# Patient Record
Sex: Female | Born: 1950
Health system: Southern US, Community
[De-identification: ages and names within clinical notes are randomized; demographics above are authoritative.]

## PROBLEM LIST (undated history)

## (undated) DIAGNOSIS — G8929 Other chronic pain: Secondary | ICD-10-CM

## (undated) DIAGNOSIS — K601 Chronic anal fissure: Secondary | ICD-10-CM

## (undated) DIAGNOSIS — E785 Hyperlipidemia, unspecified: Secondary | ICD-10-CM

## (undated) DIAGNOSIS — E119 Type 2 diabetes mellitus without complications: Secondary | ICD-10-CM

## (undated) DIAGNOSIS — H269 Unspecified cataract: Secondary | ICD-10-CM

## (undated) DIAGNOSIS — K649 Unspecified hemorrhoids: Secondary | ICD-10-CM

## (undated) DIAGNOSIS — D649 Anemia, unspecified: Secondary | ICD-10-CM

## (undated) DIAGNOSIS — I10 Essential (primary) hypertension: Secondary | ICD-10-CM

## (undated) DIAGNOSIS — H409 Unspecified glaucoma: Secondary | ICD-10-CM

## (undated) DIAGNOSIS — K641 Second degree hemorrhoids: Secondary | ICD-10-CM

## (undated) DIAGNOSIS — K219 Gastro-esophageal reflux disease without esophagitis: Secondary | ICD-10-CM

## (undated) DIAGNOSIS — M545 Low back pain: Secondary | ICD-10-CM

## (undated) DIAGNOSIS — K602 Anal fissure, unspecified: Secondary | ICD-10-CM

## (undated) HISTORY — DX: Type 2 diabetes mellitus without complications: E11.9

## (undated) HISTORY — DX: Unspecified cataract: H26.9

## (undated) HISTORY — DX: Second degree hemorrhoids: K64.1

## (undated) HISTORY — DX: Unspecified hemorrhoids: K64.9

## (undated) HISTORY — DX: Essential (primary) hypertension: I10

## (undated) HISTORY — DX: Anemia, unspecified: D64.9

## (undated) HISTORY — DX: Unspecified glaucoma: H40.9

## (undated) HISTORY — DX: Anal fissure, unspecified: K60.2

## (undated) HISTORY — DX: Chronic anal fissure: K60.1

## (undated) HISTORY — DX: Low back pain: M54.5

## (undated) HISTORY — DX: Hyperlipidemia, unspecified: E78.5

## (undated) HISTORY — DX: Gastro-esophageal reflux disease without esophagitis: K21.9

## (undated) HISTORY — DX: Other chronic pain: G89.29

## (undated) HISTORY — PX: OTHER SURGICAL HISTORY: SHX169

---
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
## 1999-12-20 ENCOUNTER — Other Ambulatory Visit: Admission: RE | Admit: 1999-12-20 | Discharge: 1999-12-20 | Payer: Self-pay | Admitting: Obstetrics and Gynecology

## 2000-06-18 ENCOUNTER — Encounter: Payer: Self-pay | Admitting: Emergency Medicine

## 2000-06-18 ENCOUNTER — Emergency Department (HOSPITAL_COMMUNITY): Admission: EM | Admit: 2000-06-18 | Discharge: 2000-06-18 | Payer: Self-pay | Admitting: Emergency Medicine

## 2000-12-25 ENCOUNTER — Other Ambulatory Visit: Admission: RE | Admit: 2000-12-25 | Discharge: 2000-12-25 | Payer: Self-pay | Admitting: Obstetrics and Gynecology

## 2002-01-08 ENCOUNTER — Other Ambulatory Visit: Admission: RE | Admit: 2002-01-08 | Discharge: 2002-01-08 | Payer: Self-pay | Admitting: Obstetrics and Gynecology

## 2002-11-05 ENCOUNTER — Encounter (INDEPENDENT_AMBULATORY_CARE_PROVIDER_SITE_OTHER): Payer: Self-pay | Admitting: *Deleted

## 2002-11-05 ENCOUNTER — Observation Stay (HOSPITAL_COMMUNITY): Admission: RE | Admit: 2002-11-05 | Discharge: 2002-11-06 | Payer: Self-pay | Admitting: Obstetrics and Gynecology

## 2002-11-05 HISTORY — PX: BILATERAL SALPINGOOPHORECTOMY: SHX1223

## 2002-11-05 HISTORY — PX: LAPAROSCOPIC ASSISTED VAGINAL HYSTERECTOMY: SHX5398

## 2005-01-18 ENCOUNTER — Emergency Department (HOSPITAL_COMMUNITY): Admission: EM | Admit: 2005-01-18 | Discharge: 2005-01-18 | Payer: Self-pay | Admitting: Family Medicine

## 2005-08-07 ENCOUNTER — Other Ambulatory Visit: Admission: RE | Admit: 2005-08-07 | Discharge: 2005-08-07 | Payer: Self-pay | Admitting: Obstetrics and Gynecology

## 2009-05-15 ENCOUNTER — Emergency Department (HOSPITAL_COMMUNITY): Admission: EM | Admit: 2009-05-15 | Discharge: 2009-05-15 | Payer: Self-pay | Admitting: Family Medicine

## 2009-06-03 ENCOUNTER — Emergency Department (HOSPITAL_COMMUNITY): Admission: EM | Admit: 2009-06-03 | Discharge: 2009-06-03 | Payer: Self-pay | Admitting: Emergency Medicine

## 2010-10-25 LAB — URINE MICROSCOPIC-ADD ON

## 2010-10-25 LAB — URINALYSIS, ROUTINE W REFLEX MICROSCOPIC
Bilirubin Urine: NEGATIVE
Glucose, UA: NEGATIVE mg/dL
Hgb urine dipstick: NEGATIVE
Ketones, ur: NEGATIVE mg/dL
Leukocytes, UA: NEGATIVE
Nitrite: POSITIVE — AB
Protein, ur: NEGATIVE mg/dL
Specific Gravity, Urine: 1.024 (ref 1.005–1.030)
Urobilinogen, UA: 0.2 mg/dL (ref 0.0–1.0)
pH: 5 (ref 5.0–8.0)

## 2010-10-25 LAB — URINE CULTURE: Colony Count: >=100000

## 2010-12-08 NOTE — Op Note (Signed)
NAME:  Rhonda Miles, Rhonda Miles                        ACCOUNT NO.:  [id number]   MEDICAL RECORD NO.:  [id number]                   PATIENT TYPE:  OBV   LOCATION:  9120                                 FACILITY:  [facility]   PHYSICIAN:  Duke Salvia. Marcelle Overlie, M.D.            DATE OF BIRTH:  1950/11/26   DATE OF PROCEDURE:  11/05/2002  DATE OF DISCHARGE:                                 OPERATIVE REPORT   PREOPERATIVE DIAGNOSIS:  Menorrhagia unresponsive to conservative measures.   POSTOPERATIVE DIAGNOSIS:  Menorrhagia unresponsive to conservative measures.   PROCEDURE:  Laparoscopically-assisted vaginal hysterectomy, bilateral  salpingo-oophorectomy.   SURGEON:  Duke Salvia. Marcelle Overlie, M.D.   ASSISTANT:  Raynald Kemp, M.D.   ANESTHESIA:  General endotracheal.   ESTIMATED BLOOD LOSS:  150.   SPECIMENS:  Uterus, tubes, and ovaries.   DESCRIPTION OF PROCEDURE:  The patient to the operating room.  After an  adequate level of general endotracheal anesthesia was established with the  patient's legs in stirrups, the abdomen, perineum, and vagina were prepped  and draped in the usual manner for laparoscopy and LAVH-BSO.  The bladder  was drained, a UA carried out.  The uterus was midposition, normal size, and  normal.  Adnexa negative.  A Hulka tenaculum was positioned.  Attention was  directed to the abdomen, where a 2 cm subumbilical incision was made.  The  Veress needle was introduced without difficulty.  Its intra-abdominal  position was verified by pressure and water testing.  After a 2 L  pneumoperitoneum was then created, laparoscopic trocar and sleeve was then  introduced without difficulty.  There was no evidence of any bleeding or  trauma.  Three fingerbreadths above the symphysis in the midline a 5 mm  trocar was inserted under direct visualization.  The bladder had been  drained preoperatively.  The patient then placed in Trendelenburg and pelvic  findings as follows:  The uterus itself  was normal size.  Adnexa bilaterally  unremarkable.  The cul-de-sac was free and clear.  No other abnormalities  were noted.  The right adnexa was grasped with an atraumatic grasper and  placed on tension toward the midline to isolate the area of the right IP  ligament.  The course of the ureter was well below it.  The bipolar cutting  instrument was then used to coagulate and cut the right IP ligament up to  and including the right round ligament.  Again the course of this ureter was  noted on inspection to be well below this.  This area was hemostatic.  The  exact same repeated on the opposite side, freeing up the left adnexa.  When  this was completed, the vaginal portion of the procedure was started and the  legs were extended further.  The weighted speculum was positioned and the  cervix grasped with a tenaculum.  The cervicovaginal mucosa was incised.  Posterior culdotomy performed without difficulty.  The left uterosacral  ligament was clamped, coagulated, and cut.  Same on the opposite side.  The  bladder was advanced superiorly until the peritoneal reflection could be  identified, and this was entered sharply.  The retractor was used to gently  elevate the bladder out of the field.  In a sequential manner the cardinal  ligament, uterine vasculature pedicle, and upper broad ligament pedicles  were clamped, coagulated, and cut.  The fundus of the uterus was then  delivered posteriorly.  The remaining pedicles were clamped, coagulated, and  divided.  The cuff closed with a running locked 2-0 Monocryl suture.  All  major pedicles inspected at that point and noted to be hemostatic.  Prior to  closure, the sponge, needle, and instrument counts were reported as correct  x2.  The vaginal mucosa was then closed with figure-of-eight 2-0 Monocryl  sutures.  The Foley catheter was positioned draining clear urine.  Reinspection laparoscopically revealed hemostasis.  Instruments were  removed,  gas allowed to escape.  The deep layer was closed with 4-0 Dexon  subcuticular sutures and Dermabond.  She tolerated this well and went to the  recovery room in good condition.                                               Richard M. Marcelle Overlie, M.D.    RMH/MEDQ  D:  11/05/2002  T:  11/05/2002  Job:  [id number]

## 2010-12-08 NOTE — Discharge Summary (Signed)
   NAME:  Rhonda Miles, Rhonda Miles                        ACCOUNT NO.:  [id number]   MEDICAL RECORD NO.:  [id number]                   PATIENT TYPE:  OBV   LOCATION:  9120                                 FACILITY:  [facility]   PHYSICIAN:  Duke Salvia. Marcelle Overlie, M.D.            DATE OF BIRTH:  [birth year]   DATE OF ADMISSION:  11/05/2002  DATE OF DISCHARGE:  11/06/2002                                 DISCHARGE SUMMARY   DISCHARGE DIAGNOSES:  1. Menorrhagia.  2. Laparoscopically assisted vaginal hysterectomy/bilateral salpingo-     oophorectomy this admission.   HISTORY AND PHYSICAL:  Please see admission H&P for details.  Briefly, a 60-  year-old G2, P2 with persistent menorrhagia.  Has not responded well to  hormonal management.  She has had a prior D&C, hysteroscopy August 2003.  Findings showed benign polyps and benign proliferative endometrium.   HOSPITAL COURSE:  On April 15 under general anesthesia the patient underwent  LAVH/BSO.  EBL was 150 mL.  The first postoperative day she was afebrile,  tolerating a regular diet, ambulating without difficulty.  She was voiding  well and had a stable hemoglobin of 10.5.  Was ready for discharge at that  point.   LABORATORY DATA:  Preoperative hemoglobin 13.1.  Postoperative CBC on April  16:  WBC 6.6, hemoglobin 10.5.  Admission UA:  Trace leukocyte esterase,  many epithelials, and bacteria.  Blood type is O+.  Pathology report is  still pending.   DISPOSITION:  The patient discharged on Tylox p.r.n. pain.  Will return to  our office in one week.  Advised to report any increased vaginal bleeding  and incisional redness or drainage, fever over 101, persistent vomiting.  She was given specific instructions regarding diet, sex, exercise.   CONDITION ON DISCHARGE:  Good.   ACTIVITY:  Graded increase.                                               Richard M. Marcelle Overlie, M.D.    RMH/MEDQ  D:  11/06/2002  T:  11/06/2002  Job:  [id number]

## 2010-12-08 NOTE — H&P (Signed)
   NAME:  Rhonda Miles, Rhonda Miles                        ACCOUNT NO.:  [id number]   MEDICAL RECORD NO.:  [id number]                   PATIENT TYPE:  AMB   LOCATION:  SDC                                  FACILITY:  [facility]   PHYSICIAN:  Duke Salvia. Marcelle Overlie, M.D.            DATE OF BIRTH:  April 14, 1951   DATE OF ADMISSION:  DATE OF DISCHARGE:                                HISTORY & PHYSICAL   ADMISSION DATE:  November 05, 2002.   CHIEF COMPLAINT:  Menorrhagia.   HISTORY OF PRESENT ILLNESS:  The patient is a 60 year old G2, P2 with  persistent menorrhagia that has not responded well to OCP's, specifically  HRT regimens or Mircette presents now for definitive laparoscopically  assisted vaginal hysterectomy, bilateral salpingo-oophorectomy.   This patient underwent D&C, hysteroscopy in August 2003 to further evaluate  her bleeding.  The findings at the time were normal. Pathology showed  several small benign polyps and benign proliferative endometrium.  Unfortunately she has continued to have heavy irregular bleeding since that  time and prefers now definitive therapy.  This procedure including risks of  bleeding, infection, transfusion, adjacent organ injury, the possible need  for open or additional surgery were discussed.  Postoperative ERT reviewed.  The expected recovery time was discussed along with risks of wound infection  and phlebitis.   PAST MEDICAL HISTORY:   ALLERGIES:  None.   PAST SURGICAL HISTORY:  Ankle surgery and D&C, hysteroscopy.   CURRENT MEDICATIONS:  OCP's to regulate her cycles.   OBSTETRICAL HISTORY:  Two vaginal deliveries, one in 1974, one in 1979  without complications.   FAMILY HISTORY:  Significant for mother who has had hypertension and  cerebrovascular accident and father with diabetes.   PHYSICAL EXAMINATION:  VITAL SIGNS:  Temperature 98.2.  Blood pressure  120/70.  HEENT:  Unremarkable.  NECK:  Supple without masses.  LUNGS:  Clear.  CARDIOVASCULAR:   Regular rate and rhythm without murmurs, rubs, gallops  noted.  BREASTS:  Without masses.  ABDOMEN:  Soft, flat, nontender.  PELVIC:  Normal external genitalia.  Vagina and cervix clear.  Uterus in mid  position, normal size.  Adnexa unremarkable.  Uterus was mobile.  EXTREMITIES:  Unremarkable.  NEUROLOGICAL:  Unremarkable.   IMPRESSION:  Abnormal uterine bleeding, unresponsive to conservative  measures.   PLAN:  Laparoscopically assisted vaginal hysterectomy, bilateral salpingo-  oophorectomy.  The procedures and risks have been reviewed as above.                                               Richard M. Marcelle Overlie, M.D.    RMH/MEDQ  D:  11/02/2002  T:  11/02/2002  Job:  [id number]

## 2013-02-13 ENCOUNTER — Emergency Department (INDEPENDENT_AMBULATORY_CARE_PROVIDER_SITE_OTHER)
Admission: EM | Admit: 2013-02-13 | Discharge: 2013-02-13 | Disposition: A | Discharge status: Nursing Home (Includes ICF) | Payer: PRIVATE HEALTH INSURANCE | Source: Home / Self Care

## 2013-02-13 ENCOUNTER — Emergency Department (HOSPITAL_COMMUNITY): Payer: PRIVATE HEALTH INSURANCE

## 2013-02-13 ENCOUNTER — Encounter (HOSPITAL_COMMUNITY): Payer: Self-pay | Admitting: Emergency Medicine

## 2013-02-13 ENCOUNTER — Encounter (HOSPITAL_COMMUNITY): Payer: Self-pay

## 2013-02-13 ENCOUNTER — Observation Stay (HOSPITAL_COMMUNITY)
Admission: EM | Admit: 2013-02-13 | Discharge: 2013-02-14 | Disposition: A | Discharge status: Home / Self Care | Payer: PRIVATE HEALTH INSURANCE | Attending: Internal Medicine | Admitting: Internal Medicine

## 2013-02-13 DIAGNOSIS — R079 Chest pain, unspecified: Secondary | ICD-10-CM

## 2013-02-13 DIAGNOSIS — E876 Hypokalemia: Secondary | ICD-10-CM

## 2013-02-13 DIAGNOSIS — E669 Obesity, unspecified: Secondary | ICD-10-CM | POA: Diagnosis present

## 2013-02-13 DIAGNOSIS — R1310 Dysphagia, unspecified: Secondary | ICD-10-CM | POA: Insufficient documentation

## 2013-02-13 DIAGNOSIS — E119 Type 2 diabetes mellitus without complications: Secondary | ICD-10-CM | POA: Insufficient documentation

## 2013-02-13 LAB — BASIC METABOLIC PANEL
CO2: 23 mEq/L (ref 19–32)
Calcium: 8.6 mg/dL (ref 8.4–10.5)
Chloride: 102 mEq/L (ref 96–112)
Creatinine, Ser: 0.44 mg/dL — ABNORMAL LOW (ref 0.50–1.10)
Glucose, Bld: 323 mg/dL — ABNORMAL HIGH (ref 70–99)

## 2013-02-13 LAB — CBC WITH DIFFERENTIAL/PLATELET
Eosinophils Relative: 3 % (ref 0–5)
HCT: 40.2 % (ref 36.0–46.0)
Hemoglobin: 13.7 g/dL (ref 12.0–15.0)
Lymphocytes Relative: 35 % (ref 12–46)
Lymphs Abs: 2.3 10*3/uL (ref 0.7–4.0)
MCV: 78.8 fL (ref 78.0–100.0)
Monocytes Absolute: 0.4 10*3/uL (ref 0.1–1.0)
Monocytes Relative: 5 % (ref 3–12)
RBC: 5.1 MIL/uL (ref 3.87–5.11)
WBC: 6.5 10*3/uL (ref 4.0–10.5)

## 2013-02-13 LAB — URINALYSIS, ROUTINE W REFLEX MICROSCOPIC
Glucose, UA: >1000 mg/dL — AB
Hgb urine dipstick: NEGATIVE
Specific Gravity, Urine: 1.039 — ABNORMAL HIGH (ref 1.005–1.030)
pH: 5.5 (ref 5.0–8.0)

## 2013-02-13 LAB — URINE MICROSCOPIC-ADD ON

## 2013-02-13 MED ORDER — ACETAMINOPHEN 325 MG PO TABS: 650.0000 mg | ORAL_TABLET | Freq: Four times a day (QID) | ORAL | Status: DC | PRN

## 2013-02-13 MED ORDER — ONDANSETRON HCL 4 MG PO TABS: 4.0000 mg | ORAL_TABLET | Freq: Four times a day (QID) | ORAL | Status: DC | PRN

## 2013-02-13 MED ORDER — GI COCKTAIL ~~LOC~~
ORAL | Status: AC
  Filled 2013-02-13: qty 30

## 2013-02-13 MED ORDER — ACETAMINOPHEN 650 MG RE SUPP: 650.0000 mg | Freq: Four times a day (QID) | RECTAL | Status: DC | PRN

## 2013-02-13 MED ORDER — HEPARIN SODIUM (PORCINE) 5000 UNIT/ML IJ SOLN
5000.0000 [IU] | Freq: Three times a day (TID) | INTRAMUSCULAR | Status: DC
  Administered 2013-02-13 – 2013-02-14 (×3): 5000 [IU] via SUBCUTANEOUS
  Filled 2013-02-13 (×6): qty 1

## 2013-02-13 MED ORDER — NITROGLYCERIN 0.4 MG SL SUBL
0.4000 mg | SUBLINGUAL_TABLET | SUBLINGUAL | Status: DC | PRN
  Administered 2013-02-13: 0.4 mg via SUBLINGUAL

## 2013-02-13 MED ORDER — SODIUM CHLORIDE 0.9 % IJ SOLN
3.0000 mL | Freq: Two times a day (BID) | INTRAMUSCULAR | Status: DC
  Administered 2013-02-13: 3 mL via INTRAVENOUS

## 2013-02-13 MED ORDER — SODIUM CHLORIDE 0.9 % IV SOLN
Freq: Once | INTRAVENOUS | Status: AC
  Administered 2013-02-13: 12:00:00 via INTRAVENOUS

## 2013-02-13 MED ORDER — GI COCKTAIL ~~LOC~~
30.0000 mL | Freq: Once | ORAL | Status: AC
  Administered 2013-02-13: 30 mL via ORAL

## 2013-02-13 MED ORDER — NITROGLYCERIN 0.4 MG SL SUBL
SUBLINGUAL_TABLET | SUBLINGUAL | Status: AC
  Filled 2013-02-13: qty 25

## 2013-02-13 MED ORDER — SODIUM CHLORIDE 0.9 % IV SOLN
INTRAVENOUS | Status: DC
  Administered 2013-02-13: 19:00:00 via INTRAVENOUS

## 2013-02-13 MED ORDER — ONDANSETRON HCL 4 MG/2ML IJ SOLN: 4.0000 mg | Freq: Four times a day (QID) | INTRAMUSCULAR | Status: DC | PRN

## 2013-02-13 MED ORDER — NITROGLYCERIN 0.4 MG SL SUBL: 0.4000 mg | SUBLINGUAL_TABLET | SUBLINGUAL | Status: DC | PRN

## 2013-02-13 MED ORDER — GI COCKTAIL ~~LOC~~: 30.0000 mL | Freq: Three times a day (TID) | ORAL | Status: DC | PRN

## 2013-02-13 MED ORDER — MORPHINE SULFATE 4 MG/ML IJ SOLN
4.0000 mg | Freq: Once | INTRAMUSCULAR | Status: AC
  Administered 2013-02-13: 4 mg via INTRAVENOUS
  Filled 2013-02-13: qty 1

## 2013-02-13 MED ORDER — OXYCODONE HCL 5 MG PO TABS: 5.0000 mg | ORAL_TABLET | ORAL | Status: DC | PRN

## 2013-02-13 MED ORDER — ALUM & MAG HYDROXIDE-SIMETH 200-200-20 MG/5ML PO SUSP: 30.0000 mL | Freq: Four times a day (QID) | ORAL | Status: DC | PRN

## 2013-02-13 NOTE — ED Notes (Signed)
Patient complains of tightness in chest.  Patient has had complaint since Wednesday.  Patient feels as if food does not go down as usual, then tightness sets in.  Currently alert and oriented x 3 no sob, no diaphoresis.  Patient engaging and pleasant.  Denies having pcp, reports never being sick and not on any medicines.

## 2013-02-13 NOTE — ED Provider Notes (Signed)
CSN: [id number]     Arrival date & time 02/13/13  1243 History     First MD Initiated Contact with Patient 02/13/13 1306     Chief Complaint  Patient presents with  . Chest Pain   (Consider location/radiation/quality/duration/timing/severity/associated sxs/prior Treatment) The history is provided by the patient and medical records.   Pt presents to the ED for central chest pain x 3 days.  States pain has been intermittent for the past 2 days, but constant since this morning when it woke her from sleep.  Pain described as a non-radiating, central chest pressure, described as a heaviness.  No associated palpitations, SOB, nausea, diaphoresis, extremity numbness/paresthesias.  Pain worse with exertion, better with sitting still.  Pt was seen at urgent care and given GI cocktail with some improvement of pain.  No hx of GERD, CAD, or MI.  Father had MI.  Pt not currently on any daily medications.  Pt is not a smoker.  No LE edema or recent travel.  No past medical history on file. No past surgical history on file. No family history on file. History  Substance Use Topics  . Smoking status: Never Smoker   . Smokeless tobacco: Not on file  . Alcohol Use: No   OB History   Grav Para Term Preterm Abortions TAB SAB Ect Mult Living                 Review of Systems  Cardiovascular: Positive for chest pain.  All other systems reviewed and are negative.    Allergies  Asa  Home Medications  No current outpatient prescriptions on file. BP 127/49  Pulse 70  Temp(Src) 98 F (36.7 C) (Oral)  Resp 16  SpO2 96%  Physical Exam  Nursing note and vitals reviewed. Constitutional: She is oriented to person, place, and time. She appears well-developed and well-nourished. No distress.  HENT:  Head: Normocephalic and atraumatic.  Mouth/Throat: Oropharynx is clear and moist.  Eyes: Conjunctivae and EOM are normal. Pupils are equal, round, and reactive to light.  Neck: Normal range of motion.  Neck supple.  Cardiovascular: Normal rate, regular rhythm and normal heart sounds.   Pulmonary/Chest: Effort normal and breath sounds normal. No respiratory distress.  Abdominal: Soft. Bowel sounds are normal. There is no tenderness. There is no rigidity, no guarding, no CVA tenderness, no tenderness at McBurney's point and negative Murphy's sign.  Musculoskeletal: Normal range of motion. She exhibits no edema.  Neurological: She is alert and oriented to person, place, and time. She has normal strength. No cranial nerve deficit or sensory deficit.  Skin: Skin is warm and dry.  Psychiatric: She has a normal mood and affect.    ED Course   Procedures (including critical care time)   Date: 02/13/2013  Rate: 66  Rhythm: normal sinus rhythm  QRS Axis: normal  Intervals: normal  ST/T Wave abnormalities: nonspecific T wave changes  Conduction Disutrbances:none  Narrative Interpretation: flipped t-waves inferior leads, no STEMI  Old EKG Reviewed: none available   Labs Reviewed  BASIC METABOLIC PANEL - Abnormal; Notable for the following:    Potassium 3.4 (*)    Glucose, Bld 323 (*)    Creatinine, Ser 0.44 (*)    All other components within normal limits  CBC WITH DIFFERENTIAL  POCT I-STAT TROPONIN I   Dg Chest 2 View  02/13/2013   *RADIOLOGY REPORT*  Clinical Data: Chest pain.  CHEST - 2 VIEW  Comparison: May 15, 2009.  Findings: Cardiomediastinal silhouette  appears normal.  No acute pulmonary disease is noted.  Bony thorax is intact.  Stable scarring is noted in right upper lobe.  No pneumothorax or pleural effusion is noted.  IMPRESSION: No acute cardiopulmonary abnormality seen.   Original Report Authenticated By: Lupita Raider.,  M.D.   1. Chest pain     MDM   EKG NSR, flipped t-waves inferior leads.  Trop negative.  CXR clear.  Labs as above.  Given nitro when further improvement of pain.  Consulted triad, pt will be admitted to CP r/o.  [facility] triad team 10, telemetry.  Temp  admit and holding orders placed.  VS stable.  Garlon Hatchet, PA-C 02/13/13 1659

## 2013-02-13 NOTE — ED Provider Notes (Signed)
Rhonda Miles is a 62 y.o. female who presents to Urgent Care today for central chest pain starting Wednesday. Patient describes intermittent very intense central chest pain radiating to her back. This occurred Wednesday night. She notes the pain seems to be worse with swallowing and occasionally has exertional. She denies any palpitations syncope or trouble breathing. She denies any episodes of food getting stuck in her throat. She has not tried any medications and feels well otherwise. No dyspnea.    PMH reviewed. Healthy otherwise History  Substance Use Topics  . Smoking status: Not on file  . Smokeless tobacco: Not on file  . Alcohol Use: Not on file   ROS as above Medications reviewed. Current Facility-Administered Medications  Medication Dose Route Frequency Provider Last Rate Last Dose  . gi cocktail (Maalox,Lidocaine,Donnatal)  30 mL Oral Once Rodolph Bong, MD       No current outpatient prescriptions on file.    Exam:  BP 109/61  Pulse 88  Temp(Src) 97.8 F (36.6 C) (Oral)  Resp 18  SpO2 90% (oxygen saturation on recheck is 95% on room air)  Gen: Well NAD HEENT: EOMI,  MMM Lungs: CTABL Nl WOB Heart: RRR no MRG Abd: NABS, NT, ND Exts: Non edematous BL  LE, warm and well perfused.   Twelve-lead EKG shows normal sinus rhythm at 82 beats per minute. No significant ST segment changes or abnormalities. One PVC. No Q waves.  GI cocktail: Patient noted mild improvement in pain with GI cocktail.   Assessment and Plan: 62 y.o. female with central chest pain.  Most likely diagnosis is esophageal spasm however I feel that it is reasonable to rule out acute coronary syndrome.  We'll transfer patient to the emergency room via EMS or [facility] for further evaluation and management.      Rodolph Bong, MD 02/13/13 [id number]

## 2013-02-13 NOTE — ED Notes (Signed)
Late triage entry secondary to patient care and department acuity.

## 2013-02-13 NOTE — ED Notes (Signed)
Per [facility] pt transferred from [facility] with center chest pain and tightness no other cardiac symptoms.  123/63 NSR 98%ra 76 hr, pain was a 9/10 until given GI cocktail by [facility] and pain reduced to 5/10 but still feels like her chest gets tight when she takes a deep breath.  20g rt hand KVO, No hx no meds allergy to aspirin.  Pt alert oriented X4

## 2013-02-13 NOTE — ED Notes (Signed)
Pt chest pain decreased from 9/10 to a 5/10 after she was given a GI cocktail at [facility]

## 2013-02-13 NOTE — H&P (Signed)
[facility] History and Physical  EMARY ZALAR ZOX:[id number] DOB: October 15, 1950 DOA: 02/13/2013  Referring physician: Garlon Hatchet, PA-C PCP: No PCP Per Patient   Chief Complaint: Chest pain  HPI: Rhonda Miles is a 62 y.o. African American female with no significant past medical history. Patient came in to the hospital complaining about chest pain. Patient said the pain started 2 days ago, pain is substernal, comes and goes, 7/10 at its worst. Pain increases with swallowing and GI cocktail in the hospital alleviated  the pain. Last night she did not sleep very well because of the pain, so she went today to her primary care physician for further evaluation so she was sent to the emergency department for further evaluation. Initial evaluation in the ED showed negative cardiac enzymes and nonspecific ST segment changes.  Review of Systems:  Constitutional: negative for anorexia, fevers and sweats Eyes: negative for irritation, redness and visual disturbance Ears, nose, mouth, throat, and face: negative for earaches, epistaxis, nasal congestion and sore throat Respiratory: negative for cough, dyspnea on exertion, sputum and wheezing Cardiovascular: negative for chest pain, dyspnea, lower extremity edema, orthopnea, palpitations and syncope Gastrointestinal: negative for abdominal pain, constipation, diarrhea, melena, nausea and vomiting Genitourinary:negative for dysuria, frequency and hematuria Hematologic/lymphatic: negative for bleeding, easy bruising and lymphadenopathy Musculoskeletal:negative for arthralgias, muscle weakness and stiff joints Neurological: negative for coordination problems, gait problems, headaches and weakness Endocrine: negative for diabetic symptoms including polydipsia, polyuria and weight loss Allergic/Immunologic: negative for anaphylaxis, hay fever and urticaria  History reviewed. No pertinent past medical history. History reviewed. No pertinent past  surgical history. Social History:  reports that she has never smoked. She does not have any smokeless tobacco history on file. She reports that she does not drink alcohol or use illicit drugs.  Allergies  Allergen Reactions  . Asa (Aspirin) Other (See Comments)    shakey    Family History  Problem Relation Age of Onset  . Diabetes Father   . Hypertension Mother    Prior to Admission medications   Not on File   Physical Exam: Filed Vitals:   02/13/13 1450 02/13/13 1500 02/13/13 1545 02/13/13 1800  BP: 106/57 108/52 118/64 101/57  Pulse: 65 69 67 81  Temp:      TempSrc:      Resp: 16 19 16 13   SpO2: 98% 97% 98% 98%  General appearance: alert, cooperative and no distress  Head: Normocephalic, without obvious abnormality, atraumatic  Eyes: conjunctivae/corneas clear. PERRL, EOM's intact. Fundi benign.  Nose: Nares normal. Septum midline. Mucosa normal. No drainage or sinus tenderness.  Throat: lips, mucosa, and tongue normal; teeth and gums normal  Neck: Supple, no masses, no cervical lymphadenopathy, no JVD appreciated, no meningeal signs Resp: clear to auscultation bilaterally  Chest wall: no tenderness  Cardio: regular rate and rhythm, S1, S2 normal, no murmur, click, rub or gallop  GI: soft, non-tender; bowel sounds normal; no masses, no organomegaly  Extremities: extremities normal, atraumatic, no cyanosis or edema  Skin: Skin color, texture, turgor normal. No rashes or lesions  Neurologic: Alert and oriented X 3, normal strength and tone. Normal symmetric reflexes. Normal coordination and gait  Labs on Admission:  Basic Metabolic Panel:  Recent Labs Lab 02/13/13 1317  NA 138  K 3.4*  CL 102  CO2 23  GLUCOSE 323*  BUN 11  CREATININE 0.44*  CALCIUM 8.6   Liver Function Tests: No results found for this basename: AST, ALT, ALKPHOS, BILITOT, PROT, ALBUMIN,  in the last 168 hours No results found for this basename: LIPASE, AMYLASE,  in the last 168 hours No  results found for this basename: AMMONIA,  in the last 168 hours CBC:  Recent Labs Lab 02/13/13 1317  WBC 6.5  NEUTROABS 3.7  HGB 13.7  HCT 40.2  MCV 78.8  PLT 218   Cardiac Enzymes: No results found for this basename: CKTOTAL, CKMB, CKMBINDEX, TROPONINI,  in the last 168 hours  BNP (last 3 results) No results found for this basename: PROBNP,  in the last 8760 hours CBG: No results found for this basename: GLUCAP,  in the last 168 hours  Radiological Exams on Admission: Dg Chest 2 View  02/13/2013   *RADIOLOGY REPORT*  Clinical Data: Chest pain.  CHEST - 2 VIEW  Comparison: May 15, 2009.  Findings: Cardiomediastinal silhouette appears normal.  No acute pulmonary disease is noted.  Bony thorax is intact.  Stable scarring is noted in right upper lobe.  No pneumothorax or pleural effusion is noted.  IMPRESSION: No acute cardiopulmonary abnormality seen.   Original Report Authenticated By: Lupita Raider.,  M.D.    EKG: Independently reviewed.   Assessment/Plan Principal Problem:   Chest pain Active Problems:   Obesity   Chest pain -Patient symptoms related to swallowing, raises question about esophageal spasm. -Will rule out acute coronary syndrome by cycling 3 sets of cardiac enzymes, repeat EKG. -Check barium swallow. -Nitroglycerin as needed for chest pain/esophageal spasm. -Of barium swallow showed significant spasm, Cardizem can probably help.  Obesity -Patient counseled  Code Status: Full code Family Communication: Plan discussed with the patient with her sister present at bedside Disposition Plan: Telemetry, observation  Time spent: 70 minutes  [facility] Pager [phone]  If 7PM-7AM, please contact night-coverage [email or web address] Password [facility] 02/13/2013, 6:19 PM

## 2013-02-13 NOTE — ED Notes (Signed)
Pt states the nitro did not help pain at all.

## 2013-02-14 ENCOUNTER — Observation Stay (HOSPITAL_COMMUNITY): Payer: PRIVATE HEALTH INSURANCE

## 2013-02-14 DIAGNOSIS — E876 Hypokalemia: Secondary | ICD-10-CM | POA: Diagnosis present

## 2013-02-14 DIAGNOSIS — E119 Type 2 diabetes mellitus without complications: Secondary | ICD-10-CM | POA: Diagnosis present

## 2013-02-14 LAB — CBC
Hemoglobin: 13.1 g/dL (ref 12.0–15.0)
MCH: 26 pg (ref 26.0–34.0)
MCV: 80.8 fL (ref 78.0–100.0)
RBC: 5.04 MIL/uL (ref 3.87–5.11)
WBC: 6.9 10*3/uL (ref 4.0–10.5)

## 2013-02-14 LAB — BASIC METABOLIC PANEL
CO2: 29 mEq/L (ref 19–32)
Glucose, Bld: 379 mg/dL — ABNORMAL HIGH (ref 70–99)
Potassium: 3.6 mEq/L (ref 3.5–5.1)
Sodium: 138 mEq/L (ref 135–145)

## 2013-02-14 LAB — GLUCOSE, CAPILLARY: Glucose-Capillary: 317 mg/dL — ABNORMAL HIGH (ref 70–99)

## 2013-02-14 MED ORDER — INSULIN PEN STARTER KIT
1.0000 | Freq: Once | Status: DC
  Filled 2013-02-14: qty 1

## 2013-02-14 MED ORDER — METFORMIN HCL 500 MG PO TABS: 500.0000 mg | ORAL_TABLET | Freq: Two times a day (BID) | ORAL | Status: DC

## 2013-02-14 MED ORDER — INSULIN ASPART 100 UNIT/ML ~~LOC~~ SOLN
0.0000 [IU] | Freq: Three times a day (TID) | SUBCUTANEOUS | Status: DC
  Administered 2013-02-14: 11 [IU] via SUBCUTANEOUS
  Administered 2013-02-14: 5 [IU] via SUBCUTANEOUS

## 2013-02-14 MED ORDER — INSULIN ASPART 100 UNIT/ML ~~LOC~~ SOLN: 0.0000 [IU] | Freq: Every day | SUBCUTANEOUS | Status: DC

## 2013-02-14 MED ORDER — LIVING WELL WITH DIABETES BOOK
Freq: Once | Status: AC
  Administered 2013-02-14: 11:00:00
  Filled 2013-02-14: qty 1

## 2013-02-14 NOTE — Progress Notes (Signed)
Pt passed RN swallow screen but c/o of pain when using straw and when eating cracker "feels like it gets stuck, the pain goes away as soon as it goes down"

## 2013-02-14 NOTE — Progress Notes (Signed)
Inpatient Diabetes Program Recommendations  AACE/ADA: New Consensus Statement on Inpatient Glycemic Control (2013)  Target Ranges:  Prepandial:   less than 140 mg/dL      Peak postprandial:   less than 180 mg/dL (1-2 hours)      Critically ill patients:  140 - 180 mg/dL    Late Entry:  Called by RN on 3W regarding this patient around 10:50am.  Patient admitted with CP.  Diagnosed with new onset DM this admission.  A1c 13.1%.  Have ordered RD consult for DM diet education, Care management consult to help patient establish PCP for after d/c, and have asked RN caring for patient to begin basic survival skills instruction with patient.  Spoke with patient via telephone today around 11am.  Spoke with pt about new diagnosis.  Discussed A1C results with patient and explained what an A1C is, basic pathophysiology of DM Type 2, basic home care, importance of checking CBGs and maintaining good CBG control to prevent long-term and short-term complications.  Discussed the three different ways patient can control her CBGs at home (Nutrition therapy, exercise, and medications).  Discussed basic diet information with patient including basic concepts of portion control and identification of carbohydrates.  Encouraged patient to eliminate sugar sweetened beverages (regular sodas, sweet tea) from her diet.  RNs to provide ongoing basic DM education at bedside with this patient.  DM educational booklet ordered by RN this morning.  Also placed order for insulin teaching with insulin pen (in case patient d/c'd home on insulin) and also placed order for Outpatient DM education follow up (at the [facility] and DM Management center).   MD- Please change current diet order to Carbohydrate Modified diet (currently on Regular diet with no carbohydrate restrictions).  Will follow. Ambrose Finland RN, MSN, CDE Diabetes Coordinator Inpatient Diabetes Program [phone]

## 2013-02-14 NOTE — Evaluation (Signed)
Clinical/Bedside Swallow Evaluation Patient Details  Name: Rhonda Miles MRN: [id number] Date of Birth: 09/21/1950  Today's Date: 02/14/2013 Time: 1100-1141 SLP Time Calculation (min): 41 min  Past Medical History: History reviewed. No pertinent past medical history. Past Surgical History: History reviewed. No pertinent past surgical history. HPI:  62 year old female with unremarkable PMH admitted 02/13/13 due to chest pain.  Pt complains of food sticking, and of pain with swallow.  BSE ordered to evaluate swallow function and safety.   Assessment / Plan / Recommendation Clinical Impression  Pt exhibits normal oral motor strength and function, and does not present with clinical indications of aspiration.  Pt's symptoms appear to be consistent with esophageal issues.  Recommend further work up of esophageal motility.  No further ST intervention is anticipated at this time, but available if needs arise.    Aspiration Risk  Mild    Diet Recommendation Regular;Thin liquid   Liquid Administration via: Straw;Cup Medication Administration: Whole meds with liquid Supervision: Patient able to self feed Compensations: Slow rate;Small sips/bites Postural Changes and/or Swallow Maneuvers: Seated upright 90 degrees;Upright 30-60 min after meal    Other  Recommendations Recommended Consults: Consider esophageal assessment Oral Care Recommendations: Oral care BID   Follow Up Recommendations  None    Frequency and Duration        Pertinent Vitals/Pain No pain reported   Swallow Study Prior Functional Status   Regular diet/thin liquids prior to admit    General Date of Onset: 02/13/13 HPI: 62 year old female with unremarkable PMH admitted 02/13/13 due to chest pain.  Pt complains of food sticking, and of pain with swallow.  BSE ordered to evaluate swallow function and safety. Type of Study: Bedside swallow evaluation Previous Swallow Assessment: n/a Diet Prior to this Study: Regular;Thin  liquids Temperature Spikes Noted: No Respiratory Status: Room air History of Recent Intubation: No Behavior/Cognition: Alert;Cooperative;Pleasant mood Oral Cavity - Dentition: Adequate natural dentition Self-Feeding Abilities: Able to feed self Patient Positioning: Upright in bed Baseline Vocal Quality: Clear Volitional Cough: Strong Volitional Swallow: Able to elicit    Oral/Motor/Sensory Function Overall Oral Motor/Sensory Function: Appears within functional limits for tasks assessed   Ice Chips Ice chips: Not tested   Thin Liquid Thin Liquid: Within functional limits Presentation: Straw    Nectar Thick Nectar Thick Liquid: Not tested   Honey Thick Honey Thick Liquid: Not tested   Puree Puree: Within functional limits Presentation: Self Fed;Spoon   Solid   GO Functional Assessment Tool Used: clinical judgment and clinical evaluation of swallow function and safety. Functional Limitations: Swallowing Swallow Current Status (W0981): 0 percent impaired, limited or restricted Swallow Goal Status (X9147): 0 percent impaired, limited or restricted Swallow Discharge Status [phone]): 0 percent impaired, limited or restricted  Solid: Within functional limits Presentation: Self Fed       Drayton Tieu B. Murvin Natal [facility], CCC-SLP [phone]  [phone] [name] 02/14/2013,11:47 AM

## 2013-02-14 NOTE — Progress Notes (Signed)
[facility] PROGRESS NOTE  NAKAILA FREEZE LKG:[id number] DOB: 16-Jun-1951 DOA: 02/13/2013 PCP: No PCP Per Patient  Brief narrative: 62 y.o. African American female with no significant past medical history who presented to [facility] ED 02/13/2013 with chest pain, substernal, 7/10 in intensity and feels like pressure. Pain was worse with swallowing.   Assessment/Plan:  Principal Problem:   Chest pain - ACS rule out, cardiac enzymes are negative - follow up 2 D ECHO results  Active Problems:   Dysphagia - Schatzki's ring wide open based on Barium swallow, no esophageal strictures.   Newly diagnosed diabetes - A1c on this admission 13.1 - start sliding scale insulin - appreciated diabetic coordinator consult   Obesity - counseled on diet   Hypokalemia - repleted - potassium WNL  Code Status: full code Family Communication: no family at the bedside Disposition Plan: home when stable  Manson Passey, MD  [facility] Pager [phone]  If 7PM-7AM, please contact night-coverage [email or web address] Password [email or web address] 02/14/2013, 2:20 PM   LOS: 1 day   Consultants:  None   Procedures:  Barium swallow 02/14/13  Antibiotics:  None   HPI/Subjective: Feels somewhat better this am. Chest pressure on swallowing.   Objective: Filed Vitals:   02/13/13 2028 02/14/13 0500 02/14/13 0900 02/14/13 1340  BP: 130/54 104/45  115/86  Pulse: 66 65  74  Temp: 97.7 F (36.5 C) 98.3 F (36.8 C) 97.6 F (36.4 C) 98.4 F (36.9 C)  TempSrc: Oral   Oral  Resp:  18  20  Height:      Weight:      SpO2: 97% 98%  98%    Intake/Output Summary (Last 24 hours) at 02/14/13 1420 Last data filed at 02/14/13 1300  Gross per 24 hour  Intake    600 ml  Output    300 ml  Net    300 ml    Exam:   General:  Pt is alert, follows commands appropriately, not in acute distress  Cardiovascular: Regular rate and rhythm, S1/S2, no murmurs, no rubs, no gallops  Respiratory: Clear to auscultation bilaterally, no  wheezing, no crackles, no rhonchi  Abdomen: Soft, non tender, non distended, bowel sounds present, no guarding  Extremities: No edema, pulses DP and PT palpable bilaterally  Neuro: Grossly nonfocal  Data Reviewed: Basic Metabolic Panel:  Recent Labs Lab 02/13/13 1317 02/14/13 0015  NA 138 138  K 3.4* 3.6  CL 102 100  CO2 23 29  GLUCOSE 323* 379*  BUN 11 10  CREATININE 0.44* 0.54  CALCIUM 8.6 8.9   Liver Function Tests: No results found for this basename: AST, ALT, ALKPHOS, BILITOT, PROT, ALBUMIN,  in the last 168 hours No results found for this basename: LIPASE, AMYLASE,  in the last 168 hours No results found for this basename: AMMONIA,  in the last 168 hours CBC:  Recent Labs Lab 02/13/13 1317 02/14/13 0015  WBC 6.5 6.9  NEUTROABS 3.7  --   HGB 13.7 13.1  HCT 40.2 40.7  MCV 78.8 80.8  PLT 218 217   Cardiac Enzymes:  Recent Labs Lab 02/13/13 1852 02/14/13 0015  TROPONINI <0.30 <0.30   BNP: No components found with this basename: POCBNP,  CBG:  Recent Labs Lab 02/14/13 0752 02/14/13 1153  GLUCAP 287* 317*    No results found for this or any previous visit (from the past 240 hour(s)).   Studies: Dg Chest 2 View  02/13/2013   *RADIOLOGY REPORT*  Clinical Data: Chest pain.  CHEST -  2 VIEW  Comparison: May 15, 2009.  Findings: Cardiomediastinal silhouette appears normal.  No acute pulmonary disease is noted.  Bony thorax is intact.  Stable scarring is noted in right upper lobe.  No pneumothorax or pleural effusion is noted.  IMPRESSION: No acute cardiopulmonary abnormality seen.   Original Report Authenticated By: Lupita Raider.,  M.D.   Dg Esophagus  02/14/2013   *RADIOLOGY REPORT*  Clinical data:  Sensation of food sticking in the mid esophagus, occasionally associated with pain.  ESOPHOGRAM / BARIUM SWALLOW  Technique:  Combined double contrast and single contrast examination performed using effervescent crystals, thick barium liquid, and thin  barium liquid.  Comparison: None.  Findings:  Patient swallowed the thick and thin barium liquid without difficulty.  There is breakup of primary peristaltic wave in the mid esophagus.  A few scattered tertiary esophageal contractions were noted.  Small hiatal small hiatal hernia with wide open Schatzki's ring.  No fixed esophageal strictures or masses.  A 12.5 mm barium tablet easily passed through the esophagus into the stomach without obstruction.  Spontaneous gastroesophageal reflux occurred into the distal esophagus.  Evaluation of the pharynx during rapid sequence imaging with swallows of thin barium liquid demonstrated a very small, insignificant lateral pharyngocele on the right and minimal, insignificant nasopharyngeal reflux.  Fluoroscopy time:  1 minute, 49 seconds, pulsed fluoroscopy  IMPRESSION:  1.  Small hiatal hernia with wide open Schatzki's ring. 2.  Spontaneous gastroesophageal reflux into the distal esophagus. No fixed esophageal strictures or masses. 3.  Esophageal dysmotility, likely accounting for the patient's symptoms.  Preliminary results were discussed with the patient at the time of the examination.   Original Report Authenticated By: Hulan Saas, M.D.    Scheduled Meds: . heparin  5,000 Units Subcutaneous Q8H  . insulin aspart  0-15 Units Subcutaneous TID WC  . insulin aspart  0-5 Units Subcutaneous QHS  . Insulin Pen Starter Kit  1 kit Other Once  . sodium chloride  3 mL Intravenous Q12H   Continuous Infusions: . sodium chloride 75 mL/hr at 02/13/13 1842

## 2013-02-14 NOTE — Discharge Summary (Signed)
Physician Discharge Summary  Rhonda Miles OZH:[id number] DOB: 24-Dec-1950 DOA: 02/13/2013  PCP: No PCP Per Patient  Admit date: 02/13/2013 Discharge date: 02/14/2013  Recommendations for Outpatient Follow-up:  1. Please note that your chest discomfort is probably due to an esophageal spasm. Per swallow evaluation please follow their recommendations, eat smaller meals and drink warm fluids prior to solid meal ingestion. Please follow up with your PCP as soon as you can to have you reevaluated if you should need further studies.  2. You have been diagnosed with diabetes. Your A1c (indicating your glycemic/sugar control is 13.1 and it should be less than 6.5). We will start you on metformin 500 mg twice daily (pharmacy on [address] was called in for prescription). Please followup with your PCP and they will recheck your A1c to reassess glycemic control). Please use diabetic diet.   3. You cardiac enzymes are within normal limits.  4. Please [facility] in [facility], call [phone] to schedule an appointment with one of our colleagues Monday 02/16/2013 for follow up . We will continue to care for you and can be your PCP.   Discharge Diagnoses:  Principal Problem:   Chest pain Active Problems:   Newly diagnosed diabetes   Obesity   Hypokalemia    Discharge Condition: medically stable for discharge home today  Diet recommendation: diabetic, thin liquid and advance as tolerated  History of present illness:  62 y.o. African American female with no significant past medical history who presented to [facility] ED 02/13/2013 with chest pain, substernal, 7/10 in intensity and feels like pressure. Pain was worse with swallowing. Patient was evaluated by SLP and recommendation was for thin liquids prior to solid meal ingestion, small meals. Patient was found to have A1c 13.1 on this admission and we have started metformin 500 mg PO BID and she will follow u pwith me  or my colleague, she will schedule the appointment 02/16/2013 per her preference.   Assessment/Plan:   Principal Problem:  Chest pain  - ACS rule out, cardiac enzymes are negative  - likely due to esophageal spasm Active Problems:  Dysphagia  - Schatzki's ring wide open based on Barium swallow, no esophageal strictures.  Newly diagnosed diabetes  - A1c on this admission 13.1   - appreciated diabetic coordinator consult  - metformin on discharge 500 mg PO BID Obesity  - counseled on diet  Hypokalemia  - repleted  - potassium WNL   Code Status: full code  Family Communication: no family at the bedside    Manson Passey, MD  [facility]  Pager [phone]   Consultants:  None  Procedures:  Barium swallow 02/14/13   Discharge Exam: Filed Vitals:   02/14/13 1340  BP: 115/86  Pulse: 74  Temp: 98.4 F (36.9 C)  Resp: 20   Filed Vitals:   02/13/13 2028 02/14/13 0500 02/14/13 0900 02/14/13 1340  BP: 130/54 104/45  115/86  Pulse: 66 65  74  Temp: 97.7 F (36.5 C) 98.3 F (36.8 C) 97.6 F (36.4 C) 98.4 F (36.9 C)  TempSrc: Oral   Oral  Resp:  18  20  Height:      Weight:      SpO2: 97% 98%  98%    General: Pt is alert, follows commands appropriately, not in acute distress Cardiovascular: Regular rate and rhythm, S1/S2 +, no murmurs, no rubs, no gallops Respiratory: Clear to auscultation bilaterally, no wheezing, no crackles, no rhonchi Abdominal: Soft,  non tender, non distended, bowel sounds +, no guarding Extremities: no edema, no cyanosis, pulses palpable bilaterally DP and PT Neuro: Grossly nonfocal  Discharge Instructions  Discharge Orders   Future Orders Complete By Expires     Ambulatory referral to Nutrition and Diabetic Education  As directed     Comments:      A1c 13.1% (02/13/13).  Newly diagnosed with DM.  +family history.  May go home on insulin.    Patient: Please call the [facility] after discharge to schedule  an appointment for diabetes education if you do not hear from the center before discharge  [phone]    Call MD for:  extreme fatigue  As directed     Call MD for:  persistant dizziness or light-headedness  As directed     Call MD for:  persistant nausea and vomiting  As directed     Call MD for:  severe uncontrolled pain  As directed     Diet - low sodium heart healthy  As directed     Discharge instructions  As directed     Comments:      1. Please note that your chest discomfort is probably due to an esophageal spasm. Per swallow evaluation please follow their recommendations, eat smaller meals and drink warm fluids prior to solid meal ingestion. Please follow up with your PCP as soon as you can to have you reevaluated if you should need further studies. 2. You have been diagnosed with diabetes. Your A1c (indicating your glycemic/sugar control is 13.1 and it should be less than 6.5). We will start you on metformin 500 mg twice daily (pharmacy on [address] was called in for prescription). Please followup with your PCP and they will recheck your A1c to reassess glycemic control). Please use diabetic diet.  3. You cardiac enzymes are within normal limits. 4. Please follow up in our [facility], call [phone] to schedule an appointment with one of our colleagues Monday 02/16/2013 for follow up . We will continue to care for you and can be your PCP.    Increase activity slowly  As directed         Medication List         metFORMIN 500 MG tablet  Commonly known as:  GLUCOPHAGE  Take 1 tablet (500 mg total) by mouth 2 (two) times daily with a meal.           Follow-up Information   Schedule an appointment as soon as possible for a visit with Manson Passey, MD. (please call monday to schedule an appointment)    Contact information:   [address] [place] [address] [phone]        The results of significant diagnostics from this  hospitalization (including imaging, microbiology, ancillary and laboratory) are listed below for reference.    Significant Diagnostic Studies: Dg Chest 2 View  02/13/2013   *RADIOLOGY REPORT*  Clinical Data: Chest pain.  CHEST - 2 VIEW  Comparison: May 15, 2009.  Findings: Cardiomediastinal silhouette appears normal.  No acute pulmonary disease is noted.  Bony thorax is intact.  Stable scarring is noted in right upper lobe.  No pneumothorax or pleural effusion is noted.  IMPRESSION: No acute cardiopulmonary abnormality seen.   Original Report Authenticated By: Lupita Raider.,  M.D.   Dg Esophagus  02/14/2013   *RADIOLOGY REPORT*  Clinical data:  Sensation of food sticking in the  mid esophagus, occasionally associated with pain.  ESOPHOGRAM / BARIUM SWALLOW  Technique:  Combined double contrast and single contrast examination performed using effervescent crystals, thick barium liquid, and thin barium liquid.  Comparison: None.  Findings:  Patient swallowed the thick and thin barium liquid without difficulty.  There is breakup of primary peristaltic wave in the mid esophagus.  A few scattered tertiary esophageal contractions were noted.  Small hiatal small hiatal hernia with wide open Schatzki's ring.  No fixed esophageal strictures or masses.  A 12.5 mm barium tablet easily passed through the esophagus into the stomach without obstruction.  Spontaneous gastroesophageal reflux occurred into the distal esophagus.  Evaluation of the pharynx during rapid sequence imaging with swallows of thin barium liquid demonstrated a very small, insignificant lateral pharyngocele on the right and minimal, insignificant nasopharyngeal reflux.  Fluoroscopy time:  1 minute, 49 seconds, pulsed fluoroscopy  IMPRESSION:  1.  Small hiatal hernia with wide open Schatzki's ring. 2.  Spontaneous gastroesophageal reflux into the distal esophagus. No fixed esophageal strictures or masses. 3.  Esophageal dysmotility, likely accounting  for the patient's symptoms.  Preliminary results were discussed with the patient at the time of the examination.   Original Report Authenticated By: Hulan Saas, M.D.    Microbiology: No results found for this or any previous visit (from the past 240 hour(s)).   Labs: Basic Metabolic Panel:  Recent Labs Lab 02/13/13 1317 02/14/13 0015  NA 138 138  K 3.4* 3.6  CL 102 100  CO2 23 29  GLUCOSE 323* 379*  BUN 11 10  CREATININE 0.44* 0.54  CALCIUM 8.6 8.9   Liver Function Tests: No results found for this basename: AST, ALT, ALKPHOS, BILITOT, PROT, ALBUMIN,  in the last 168 hours No results found for this basename: LIPASE, AMYLASE,  in the last 168 hours No results found for this basename: AMMONIA,  in the last 168 hours CBC:  Recent Labs Lab 02/13/13 1317 02/14/13 0015  WBC 6.5 6.9  NEUTROABS 3.7  --   HGB 13.7 13.1  HCT 40.2 40.7  MCV 78.8 80.8  PLT 218 217   Cardiac Enzymes:  Recent Labs Lab 02/13/13 1852 02/14/13 0015  TROPONINI <0.30 <0.30   BNP: BNP (last 3 results) No results found for this basename: PROBNP,  in the last 8760 hours CBG:  Recent Labs Lab 02/14/13 0752 02/14/13 1153  GLUCAP 287* 317*    Time coordinating discharge: Over 30 minutes  Signed:  Manson Passey, MD  [facility] 02/14/2013, 2:48 PM  Pager #: [phone]

## 2013-02-14 NOTE — Progress Notes (Signed)
Utilization Review Completed.Rhonda Miles T7/26/2014  

## 2013-02-14 NOTE — ED Provider Notes (Signed)
Medical screening examination/treatment/procedure(s) were performed by non-physician practitioner and as supervising physician I was immediately available for consultation/collaboration.   Mackynzie Woolford Ann Lizanne Erker, MD 02/14/13 0946 

## 2013-02-15 LAB — URINE CULTURE: Colony Count: 30000

## 2013-02-16 NOTE — Progress Notes (Signed)
   CARE MANAGEMENT NOTE 02/16/2013  Patient:  Rhonda Miles, Rhonda Miles   Account Number:  [id number]  Date Initiated:  02/16/2013  Documentation initiated by:  [facility]  Subjective/Objective Assessment:     Action/Plan:   Anticipated DC Date:  02/14/2013   Anticipated DC Plan:  HOME/SELF CARE      DC Planning Services  CM consult  PCP issues      Choice offered to / List presented to:             Status of service:  Completed, signed off Medicare Important Message given?   (If response is "NO", the following Medicare IM given date fields will be blank) Date Medicare IM given:   Date Additional Medicare IM given:    Discharge Disposition:  HOME/SELF CARE  Per UR Regulation:    If discussed at Long Length of Stay Meetings, dates discussed:    Comments:  02/16/2013 1240 NCM spoke to pt and states she works in [place]. She called [facility] to arrange appt for post dc follow up. States she plans to [facility] to arrange an appt for this week to get more info and education on managing the new diagnosed Diabetes. NCM explained she can call toll free number on her insurance card for additional info on providers that accept her insurance. Isidoro Donning RN CCM Case Mgmt phone [phone]

## 2013-02-27 ENCOUNTER — Ambulatory Visit: Payer: PRIVATE HEALTH INSURANCE

## 2013-03-03 ENCOUNTER — Encounter: Payer: PRIVATE HEALTH INSURANCE | Attending: Internal Medicine | Admitting: *Deleted

## 2013-03-03 VITALS — Ht 67.5 in | Wt 233.4 lb

## 2013-03-03 DIAGNOSIS — Z713 Dietary counseling and surveillance: Secondary | ICD-10-CM | POA: Insufficient documentation

## 2013-03-03 DIAGNOSIS — E119 Type 2 diabetes mellitus without complications: Secondary | ICD-10-CM | POA: Insufficient documentation

## 2013-03-03 DIAGNOSIS — E669 Obesity, unspecified: Secondary | ICD-10-CM | POA: Insufficient documentation

## 2013-03-11 ENCOUNTER — Encounter: Payer: Self-pay | Admitting: *Deleted

## 2013-03-11 NOTE — Patient Instructions (Signed)
Goals:  Follow Diabetes Meal Plan as instructed  Eat 3 meals and 2 snacks, every 3-5 hrs  Limit carbohydrate intake to 30-45 grams carbohydrate/meal  Limit carbohydrate intake to 15 grams carbohydrate/snack  Add lean protein foods to meals/snacks  Monitor glucose levels as instructed by your doctor  Aim for 15-30 mins of physical activity daily  Bring food record and glucose log to your next nutrition visit   

## 2013-03-11 NOTE — Progress Notes (Signed)
Patient was seen on 03/03/2013 for the first of a series of three diabetes self-management courses at [facility].   Current HbA1c: 13.1 % on 02/13/2013  The following learning objectives were met by the patient during this course:   Defines the role of glucose and insulin  Identifies type of diabetes and pathophysiology  Defines the diagnostic criteria for diabetes and prediabetes  States the risk factors for Type 2 Diabetes  States the symptoms of Type 2 Diabetes  Defines Type 2 Diabetes treatment goals  Defines Type 2 Diabetes treatment options  States the rationale for glucose monitoring  Identifies A1C, glucose targets, and testing times  Identifies proper sharps disposal  Defines the purpose of a diabetes food plan  Identifies carbohydrate food groups  Defines effects of carbohydrate foods on glucose levels  Identifies carbohydrate choices/grams/food labels  States benefits of physical activity and effect on glucose  Review of suggested activity guidelines  Handouts given during class include:  Type 2 Diabetes: Basics Book  My Food Plan Book  Food and Activity Log  Your patient has identified their diabetes self-care support plan as:  [facility] support group  Continued diabetes education  Follow-Up Plan: Attend core 2 and core 3

## 2013-03-12 ENCOUNTER — Ambulatory Visit: Payer: PRIVATE HEALTH INSURANCE | Attending: Family Medicine | Admitting: Internal Medicine

## 2013-03-12 VITALS — BP 116/69 | HR 80 | Temp 98.2°F | Resp 16 | Wt 233.4 lb

## 2013-03-12 DIAGNOSIS — E119 Type 2 diabetes mellitus without complications: Secondary | ICD-10-CM | POA: Insufficient documentation

## 2013-03-12 LAB — LIPID PANEL
Cholesterol: 138 mg/dL (ref 0–200)
HDL: 46 mg/dL (ref >39)
Total CHOL/HDL Ratio: 3 Ratio
VLDL: 15 mg/dL (ref 0–40)

## 2013-03-12 MED ORDER — GLUCOSE BLOOD VI STRP: ORAL_STRIP | Status: DC

## 2013-03-12 MED ORDER — METFORMIN HCL 850 MG PO TABS: 850.0000 mg | ORAL_TABLET | Freq: Two times a day (BID) | ORAL | Status: DC

## 2013-03-12 NOTE — Progress Notes (Signed)
Patient was seen on 03/12/13 for the second of a series of three diabetes self-management courses at the [facility]. The following learning objectives were met by the patient during this course:   Explain basic nutrition maintenance and quality assurance  Describe causes, symptoms and treatment of hypoglycemia and hyperglycemia  Explain how to manage diabetes during illness  Describe the importance of good nutrition for health and healthy eating strategies  List strategies to follow meal plan when dining out  Describe the effects of alcohol on glucose and how to use it safely  Describe problem solving skills for day-to-day glucose challenges  Describe strategies to use when treatment plan needs to change  Identify important factors involved in successful weight loss  Describe ways to remain physically active  Describe the impact of regular activity on insulin resistance  Identify current diabetes medications, their action on blood glucose, and [pssible side effects.  Handouts given in class:  Refrigerator magnet for Sick Day Guidelines  [facility] Oral medication/insulin handout  Your patient has identified their diabetes self-care support plan as:  [facility] support group   Follow-Up Plan: Patient will attend the final class of the ADA Diabetes Self-Care Education.   

## 2013-03-12 NOTE — Progress Notes (Signed)
Patient ID: Rhonda Miles, female   DOB: [birth year], 62 y.o.   MRN: [id number]  CC: Followup  HPI: Patient returned to clinic today for followup blood sugar. She has attended 2 sessions of nutrition class. She claims to be doing well at home even though blood sugars is still high, she does not check her blood sugar at home because she doesn't have a glucometer. She is doing exercise. No hypoglycemic episode.  Blood pressure is controlled. No complaints today. No symptoms of medication side effect.   Allergies  Allergen Reactions  . Asa [Aspirin] Other (See Comments)    shakey   Past Medical History  Diagnosis Date  . Diabetes mellitus without complication    No current outpatient prescriptions on file prior to visit.   No current facility-administered medications on file prior to visit.   Family History  Problem Relation Age of Onset  . Diabetes Father   . Hypertension Mother    History   Social History  . Marital Status: Married    Spouse Name: N/A    Number of Children: N/A  . Years of Education: N/A   Occupational History  . Not on file.   Social History Main Topics  . Smoking status: Never Smoker   . Smokeless tobacco: Not on file  . Alcohol Use: No  . Drug Use: No  . Sexual Activity: Not on file   Other Topics Concern  . Not on file   Social History Narrative  . No narrative on file    Review of Systems: Constitutional: Negative for fever, chills, diaphoresis, activity change, appetite change and fatigue. HENT: Negative for ear pain, nosebleeds, congestion, facial swelling, rhinorrhea, neck pain, neck stiffness and ear discharge.  Eyes: Negative for pain, discharge, redness, itching and visual disturbance. Respiratory: Negative for cough, choking, chest tightness, shortness of breath, wheezing and stridor.  Cardiovascular: Negative for chest pain, palpitations and leg swelling. Gastrointestinal: Negative for abdominal distention. Genitourinary: Negative  for dysuria, urgency, frequency, hematuria, flank pain, decreased urine volume, difficulty urinating and dyspareunia.  Musculoskeletal: Negative for back pain, joint swelling, arthralgias and gait problem. Neurological: Negative for dizziness, tremors, seizures, syncope, facial asymmetry, speech difficulty, weakness, light-headedness, numbness and headaches.  Hematological: Negative for adenopathy. Does not bruise/bleed easily. Psychiatric/Behavioral: Negative for hallucinations, behavioral problems, confusion, dysphoric mood, decreased concentration and agitation.    Objective:   Filed Vitals:   03/12/13 1417  BP: 116/69  Pulse: 80  Temp: 98.2 F (36.8 C)  Resp: 16    Physical Exam: Constitutional: Patient appears well-developed and well-nourished. No distress. Obese HENT: Normocephalic, atraumatic, External right and left ear normal. Oropharynx is clear and moist.  Eyes: Conjunctivae and EOM are normal. PERRLA, no scleral icterus. Neck: Normal ROM. Neck supple. No JVD. No tracheal deviation. No thyromegaly. CVS: RRR, S1/S2 +, no murmurs, no gallops, no carotid bruit.  Pulmonary: Effort and breath sounds normal, no stridor, rhonchi, wheezes, rales.  Abdominal: Soft. BS +,  no distension, tenderness, rebound or guarding.  Musculoskeletal: Normal range of motion. No edema and no tenderness.  Lymphadenopathy: No lymphadenopathy noted, cervical, inguinal or axillary Neuro: Alert. Normal reflexes, muscle tone coordination. No cranial nerve deficit. Skin: Skin is warm and dry. No rash noted. Not diaphoretic. No erythema. No pallor. Psychiatric: Normal mood and affect. Behavior, judgment, thought content normal.  Lab Results  Component Value Date   WBC 6.9 02/14/2013   HGB 13.1 02/14/2013   HCT 40.7 02/14/2013   MCV 80.8 02/14/2013  PLT 217 02/14/2013   Lab Results  Component Value Date   CREATININE 0.54 02/14/2013   BUN 10 02/14/2013   NA 138 02/14/2013   K 3.6 02/14/2013   CL 100  02/14/2013   CO2 29 02/14/2013    Lab Results  Component Value Date   HGBA1C 13.1* 02/13/2013   Lipid Panel  No results found for this basename: chol, trig, hdl, cholhdl, vldl, ldlcalc       Assessment and plan:   Patient Active Problem List   Diagnosis Date Noted  . Diabetes mellitus, new onset 03/12/2013  . Newly diagnosed diabetes 02/14/2013  . Hypokalemia 02/14/2013  . Chest pain 02/13/2013  . Obesity 02/13/2013   Lipid profile Hemoglobin A1c of 13.1 was discussed with patient She does not want to start insulin despite counseling She wants to do diet control and metformin for now  Increase metformin to 850 mg by mouth twice a day Prescription was given for glucometer and strips Patient encouraged to maintain a log of blood glucose Return to clinic in 2 weeks with blood glucose log  Nutrition and exercise counseling reinforced today Ophthalmology referral for eye exam  Rhonda Miles was given clear instructions to go to ER or return to the clinic if symptoms don't improve, worsen or new problems develop.  Rhonda Miles verbalized understanding.  Rhonda Miles was told to call to get lab results if hasn't heard anything in the next week.        Jeanann Lewandowsky, MD [facility] [place], [place] [phone]   03/12/2013, 2:35 PM

## 2013-03-12 NOTE — Progress Notes (Signed)
Patient is follow up from hospital Recently diagnosed with DM Takes oral medication for it Blood sugar today 91 A1C done 02/13/13  13.1%

## 2013-03-13 ENCOUNTER — Encounter: Payer: Self-pay | Admitting: Internal Medicine

## 2013-03-26 ENCOUNTER — Encounter: Payer: PRIVATE HEALTH INSURANCE | Attending: Internal Medicine

## 2013-03-26 ENCOUNTER — Ambulatory Visit: Payer: PRIVATE HEALTH INSURANCE | Attending: Internal Medicine | Admitting: Internal Medicine

## 2013-03-26 VITALS — BP 131/83 | HR 72 | Temp 98.1°F | Resp 18 | Ht 67.0 in | Wt 234.0 lb

## 2013-03-26 DIAGNOSIS — E119 Type 2 diabetes mellitus without complications: Secondary | ICD-10-CM

## 2013-03-26 DIAGNOSIS — IMO0001 Reserved for inherently not codable concepts without codable children: Secondary | ICD-10-CM | POA: Insufficient documentation

## 2013-03-26 DIAGNOSIS — Z713 Dietary counseling and surveillance: Secondary | ICD-10-CM | POA: Insufficient documentation

## 2013-03-26 DIAGNOSIS — E669 Obesity, unspecified: Secondary | ICD-10-CM | POA: Insufficient documentation

## 2013-03-26 MED ORDER — GLIMEPIRIDE 4 MG PO TABS: 4.0000 mg | ORAL_TABLET | Freq: Every day | ORAL | Status: DC

## 2013-03-26 MED ORDER — METFORMIN HCL 500 MG PO TABS: 500.0000 mg | ORAL_TABLET | Freq: Two times a day (BID) | ORAL | Status: DC

## 2013-03-26 NOTE — Progress Notes (Signed)
Patient ID: Rhonda Miles, female   DOB: 1950/09/01, 62 y.o.   MRN: [id number]  Chief complaint Followup on diabetes  History of present illness 62 year old female with uncontrolled diabetes mellitus was seen in the clinic 2 weeks as a hospital followup. She was admitted on hospital 6 weeks back for chest pain and found to have newly diagnosed diabetes and discharged on metformin 500 mg twice daily. During her visit to the clinic 2 weeks back her metformin dose was increased to 850 mg twice a day. She reports having increased headache since then and all course almost 3-4 times a week usually in the evenings. Denies blurred vision and reports having a visual exam done only one week back and was normal. Denies fever, chills, headache, blurry vision, nausea, vomiting, abdominal bloating, bowel or urinary symptoms. Denies muscle aches, polyuria polydipsia.  Review of systems As outlined in history of present illness   Vital signs in last 24 hours:  Filed Vitals:   03/26/13 1503  BP: 131/83  Pulse: 72  Temp: 98.1 F (36.7 C)  TempSrc: Oral  Resp: 18  Height: 5\' 7"  (1.702 m)  Weight: 234 lb (106.142 kg)  SpO2: 97%     Physical Exam:  General: Elderly female in no acute distress. HEENT: No pallor, no icterus, moist oral mucosa Heart: Normal  s1 &s2  Lungs: Clear to auscultation bilaterally. Abdomen: Soft, nontender, nondistended,    Lab Results:  Basic Metabolic Panel:    Component Value Date/Time   NA 138 02/14/2013 0015   K 3.6 02/14/2013 0015   CL 100 02/14/2013 0015   CO2 29 02/14/2013 0015   BUN 10 02/14/2013 0015   CREATININE 0.54 02/14/2013 0015   GLUCOSE 379* 02/14/2013 0015   CALCIUM 8.9 02/14/2013 0015   CBC:    Component Value Date/Time   WBC 6.9 02/14/2013 0015   HGB 13.1 02/14/2013 0015   HCT 40.7 02/14/2013 0015   PLT 217 02/14/2013 0015   MCV 80.8 02/14/2013 0015   NEUTROABS 3.7 02/13/2013 1317   LYMPHSABS 2.3 02/13/2013 1317   MONOABS 0.4 02/13/2013 1317   EOSABS 0.2 02/13/2013 1317   BASOSABS 0.0 02/13/2013 1317      Assessment/Plan:  Diabetes mellitus Uncontrolled. A1c of 13 on last visit. Doze off metformin was increased to 50 mg twice a day. She brought a log of her blood glucose and it ranges from 130-203 and is possibly expedient she has been reporting more frequent frontal headaches since the does was increased. Metformin does have a side effect of headaches in about 6% Cases. I will reduce the dose back to 500 mg twice a day. I would add Amaryl 4 mg daily. Patient encouraged to keep her log of blood glucose monitoring and monitor her diet and regular exercise. Blood pressure has been stable. Recent lipid panel was normal  Health maintenance issues were addressed during her prior visit. Followup in 2 months. If has persistent headache she is to followup in 2 weeks    Rhonda Miles 03/26/2013, 4:08 PM

## 2013-03-26 NOTE — Progress Notes (Signed)
Pt here for f/u diabetes with Metformin 850mg  tab started 03/12/13 C/o intermit h/a  CBG this am between 242-167

## 2013-03-26 NOTE — Progress Notes (Signed)
Patient was seen on reduce fat in my diet by eating less fast food for the third of a series of three diabetes self-management courses at the [facility]. The following learning objectives were met by the patient during this course:    Describe how diabetes changes over time   Identify diabetes complications and ways to prevent them   Describe strategies that can promote heart health including lowering blood pressure and cholesterol   Describe strategies to lower dietary fat and sodium in the diet   Identify physical activities that benefit cardiovascular health   Describe role of stress on blood glucose and develop strategies to address psychosocial issues   Evaluate success in meeting personal goal   Describe the belief that they can live successfully with diabetes day to day   Establish 2-3 goals that they will plan to diligently work on until they return for the free 93-month follow-up visit  The following handouts were given in class:  Goal setting handout  Class evaluation form  Low-sodium seasoning tips  Stress management handout  Your patient has established the following 4 month goals for diabetes self-care:  Take diabetes medications as scheduled  Be active 30 minutes 4 times a week  Reduce fat in my diet by eating less fast food  Your patient has identified these potential barriers to change:  none  Your patient has identified their diabetes self-care support plan as:  [facility] support group   Follow-Up Plan: Patient was offered a 4 month follow-up visit for diabetes self-management education.

## 2013-05-26 ENCOUNTER — Ambulatory Visit: Payer: PRIVATE HEALTH INSURANCE | Attending: Internal Medicine | Admitting: Internal Medicine

## 2013-05-26 VITALS — BP 145/85 | HR 85 | Temp 98.1°F | Resp 16 | Wt 237.4 lb

## 2013-05-26 DIAGNOSIS — R030 Elevated blood-pressure reading, without diagnosis of hypertension: Secondary | ICD-10-CM | POA: Insufficient documentation

## 2013-05-26 DIAGNOSIS — E119 Type 2 diabetes mellitus without complications: Secondary | ICD-10-CM | POA: Insufficient documentation

## 2013-05-26 DIAGNOSIS — E1310 Other specified diabetes mellitus with ketoacidosis without coma: Secondary | ICD-10-CM

## 2013-05-26 DIAGNOSIS — E1110 Type 2 diabetes mellitus with ketoacidosis without coma: Secondary | ICD-10-CM

## 2013-05-26 MED ORDER — GLIMEPIRIDE 4 MG PO TABS: 4.0000 mg | ORAL_TABLET | Freq: Every day | ORAL | Status: DC

## 2013-05-26 MED ORDER — METFORMIN HCL 500 MG PO TABS: 500.0000 mg | ORAL_TABLET | Freq: Two times a day (BID) | ORAL | Status: DC

## 2013-05-26 NOTE — Progress Notes (Signed)
Patient here for follow up DM 

## 2013-05-26 NOTE — Progress Notes (Signed)
Patient Demographics  Rhonda Miles, is a 62 y.o. female  WUJ:[id number]  NFA:[id number]  DOB - 1951-01-16  Chief Complaint  Patient presents with  . Follow-up        Subjective:   Rhonda Miles today is here for a follow up visit. Patient is a 62 year old African American female with a past medical history of diabetes who currently is on metformin and Amaryl, comes in for routine followup visit. She has currently no complaints.  Patient has No headache, No chest pain, No abdominal pain - No Nausea, No new weakness tingling or numbness, No Cough - SOB.  Objective:    Filed Vitals:   05/26/13 1517  BP: 145/85  Pulse: 85  Temp: 98.1 F (36.7 C)  Resp: 16  Weight: 237 lb 6.4 oz (107.684 kg)  SpO2: 100%     ALLERGIES:   Allergies  Allergen Reactions  . Asa [Aspirin] Other (See Comments)    shakey    PAST MEDICAL HISTORY: Past Medical History  Diagnosis Date  . Diabetes mellitus without complication     MEDICATIONS AT HOME: Prior to Admission medications   Medication Sig Start Date End Date Taking? Authorizing Provider  glimepiride (AMARYL) 4 MG tablet Take 1 tablet (4 mg total) by mouth daily before breakfast. 05/26/13   Maretta Bees, MD  glucose blood test strip Use as instructed 03/12/13   Jeanann Lewandowsky, MD  metFORMIN (GLUCOPHAGE) 500 MG tablet Take 1 tablet (500 mg total) by mouth 2 (two) times daily with a meal. 05/26/13   Maretta Bees, MD     Exam  General appearance :Awake, alert, not in any distress. Speech Clear. Not toxic Looking HEENT: Atraumatic and Normocephalic, pupils equally reactive to light and accomodation Neck: supple, no JVD. No cervical lymphadenopathy.  Chest:Good air entry bilaterally, no added sounds  CVS: S1 S2 regular, no murmurs.  Abdomen: Bowel sounds present, Non tender and not distended with no gaurding, rigidity or rebound. Extremities: B/L Lower Ext shows no edema, both legs are warm to touch Neurology: Awake  alert, and oriented X 3, CN II-XII intact, Non focal Skin:No Rash Wounds:N/A    Data Review   CBC No results found for this basename: WBC, HGB, HCT, PLT, MCV, MCH, MCHC, RDW, NEUTRABS, LYMPHSABS, MONOABS, EOSABS, BASOSABS, BANDABS, BANDSABD,  in the last 168 hours  Chemistries   No results found for this basename: NA, K, CL, CO2, GLUCOSE, BUN, CREATININE, GFRCGP, CALCIUM, MG, AST, ALT, ALKPHOS, BILITOT,  in the last 168 hours ------------------------------------------------------------------------------------------------------------------ No results found for this basename: HGBA1C,  in the last 72 hours ------------------------------------------------------------------------------------------------------------------ No results found for this basename: CHOL, HDL, LDLCALC, TRIG, CHOLHDL, LDLDIRECT,  in the last 72 hours ------------------------------------------------------------------------------------------------------------------ No results found for this basename: TSH, T4TOTAL, FREET3, T3FREE, THYROIDAB,  in the last 72 hours ------------------------------------------------------------------------------------------------------------------ No results found for this basename: VITAMINB12, FOLATE, FERRITIN, TIBC, IRON, RETICCTPCT,  in the last 72 hours  Coagulation profile  No results found for this basename: INR, PROTIME,  in the last 168 hours    Assessment & Plan   Diabetes - Repeat A1c - She has brought with her a diary of her CBGs-most of the readings are in the low 100s. - Continue with metformin and Amaryl  Elevated blood pressure without diagnosis of hypertension - Blood pressure is slightly elevated today-we'll keep an eye on this and recheck her blood pressure next visit-if it stays high she will need to be started on ACE inhibitors. Patient is agreeable with this  plan.   Health Maintenance -Colonoscopy: Will order -Pap Smear: Patient claims she has had it with a GYN  physician this year-repeat in 2015 -Mammogram: Patient came she had it done at a non[facility]  Facility this year-repeat in 2015 -Vaccinations:   -Influenza-uptodate-received it already this year  Follow up in 2 months  The patient was given clear instructions to go to ER or return to medical center if symptoms don't improve, worsen or new problems develop. The patient verbalized understanding. The patient was told to call to get lab results if they haven't heard anything in the next week.

## 2013-05-28 ENCOUNTER — Other Ambulatory Visit: Payer: Self-pay

## 2013-07-27 ENCOUNTER — Ambulatory Visit: Payer: BC Managed Care – PPO | Attending: Internal Medicine | Admitting: Internal Medicine

## 2013-07-27 ENCOUNTER — Encounter: Payer: Self-pay | Admitting: Internal Medicine

## 2013-07-27 ENCOUNTER — Ambulatory Visit: Payer: PRIVATE HEALTH INSURANCE | Admitting: Internal Medicine

## 2013-07-27 VITALS — BP 121/67 | HR 74 | Temp 98.0°F | Resp 16 | Wt 242.8 lb

## 2013-07-27 DIAGNOSIS — Z139 Encounter for screening, unspecified: Secondary | ICD-10-CM

## 2013-07-27 DIAGNOSIS — E119 Type 2 diabetes mellitus without complications: Secondary | ICD-10-CM | POA: Insufficient documentation

## 2013-07-27 DIAGNOSIS — Z1211 Encounter for screening for malignant neoplasm of colon: Secondary | ICD-10-CM

## 2013-07-27 LAB — POCT GLYCOSYLATED HEMOGLOBIN (HGB A1C): Hemoglobin A1C: 5.9

## 2013-07-27 NOTE — Progress Notes (Signed)
Patient here for follow up on DM.

## 2013-07-27 NOTE — Progress Notes (Signed)
MRN: [id number] Name: Rhonda Miles  Sex: female Age: 63 y.o. DOB: 07/22/1951  Allergies: Jonne PlyAsa  Chief Complaint  Patient presents with  . Follow-up    HPI: Patient is 63 y.o. female who history of diabetes comes today for followup her fasting blood sugar is between 90 to 1 30 mg/dL occasionally drops to 96E70s, she denies any hypoglycemic symptoms, currently taking metformin and Amaryl, her hemoglobin A1c is much improved now 5.9% ,Also  As per patient she had a mammogram done last year and is due for colonoscopy as well.  Past Medical History  Diagnosis Date  . Diabetes mellitus without complication     History reviewed. No pertinent past surgical history.    Medication List       This list is accurate as of: 07/27/13 12:24 PM.  Always use your most recent med list.               glucose blood test strip  Use as instructed     metFORMIN 500 MG tablet  Commonly known as:  GLUCOPHAGE  Take 1 tablet (500 mg total) by mouth 2 (two) times daily with a meal.        No orders of the defined types were placed in this encounter.     There is no immunization history on file for this patient.  Family History  Problem Relation Age of Onset  . Diabetes Father   . Hypertension Mother     History  Substance Use Topics  . Smoking status: Never Smoker   . Smokeless tobacco: Not on file  . Alcohol Use: No    Review of Systems  As noted in HPI  Filed Vitals:   07/27/13 1143  BP: 121/67  Pulse: 74  Temp: 98 F (36.7 C)  Resp: 16    Physical Exam  Physical Exam  Constitutional: No distress.  Eyes: EOM are normal. Pupils are equal, round, and reactive to light.  Cardiovascular: Normal rate and regular rhythm.   Pulmonary/Chest: Breath sounds normal. No respiratory distress. She has no wheezes. She has no rales.    CBC    Component Value Date/Time   WBC 6.9 02/14/2013 0015   RBC 5.04 02/14/2013 0015   HGB 13.1 02/14/2013 0015   HCT 40.7 02/14/2013 0015   PLT  217 02/14/2013 0015   MCV 80.8 02/14/2013 0015   LYMPHSABS 2.3 02/13/2013 1317   MONOABS 0.4 02/13/2013 1317   EOSABS 0.2 02/13/2013 1317   BASOSABS 0.0 02/13/2013 1317    CMP     Component Value Date/Time   NA 138 02/14/2013 0015   K 3.6 02/14/2013 0015   CL 100 02/14/2013 0015   CO2 29 02/14/2013 0015   GLUCOSE 379* 02/14/2013 0015   BUN 10 02/14/2013 0015   CREATININE 0.54 02/14/2013 0015   CALCIUM 8.9 02/14/2013 0015   GFRNONAA >90 02/14/2013 0015   GFRAA >90 02/14/2013 0015    Lab Results  Component Value Date/Time   CHOL 138 03/12/2013  2:38 PM    No components found with this basename: hga1c    No results found for this basename: AST    Assessment and Plan  DM (diabetes mellitus) - Plan: HgB A1c5.9%, tight controlled, continue with metformin 500 mg twice a day discontinued Amaryl, continue fingerstick log, blood pressure is 121/67, reevaluate on the next visit if elevated consider starting on ACE inhibitor.  Special screening for malignant neoplasms, colon - Plan: Ambulatory referral to Gastroenterology  Screening -  Plan: MM Digital Screening   Return in about 3 months (around 10/25/2013).  Doris Cheadle, MD

## 2013-07-27 NOTE — Patient Instructions (Signed)

## 2013-07-28 ENCOUNTER — Encounter: Payer: Self-pay | Admitting: *Deleted

## 2013-07-28 ENCOUNTER — Encounter: Payer: BC Managed Care – PPO | Attending: Internal Medicine | Admitting: *Deleted

## 2013-07-28 VITALS — Ht 67.5 in | Wt 243.6 lb

## 2013-07-28 DIAGNOSIS — E119 Type 2 diabetes mellitus without complications: Secondary | ICD-10-CM | POA: Insufficient documentation

## 2013-07-28 DIAGNOSIS — Z713 Dietary counseling and surveillance: Secondary | ICD-10-CM | POA: Insufficient documentation

## 2013-07-28 NOTE — Patient Instructions (Addendum)
PLAN: Discontinue Honey nut cheerios and switch to Special K Vanilla Almond to increase protein intake Continue exercise walking 3 times per week, use the stairs at work Continue testing glucose BID, FBS and consider 2hpp   KEEP UP THE FABULOUS WORK!.... YOU COULD BE OUR POSTER CHILD

## 2013-07-28 NOTE — Progress Notes (Signed)
  Patient was seen on 07/28/12 for her 4 month follow-up as a part of the diabetes self-management courses at the [facility].   Patient self reports the following:At time of diagnosis 13/2014 A1c was 13.1%. Most recent check up notes increase of 10# but decrease of A1c to 5.9%. She attributes these results to exercise and following a meal plan. She notes her breakfast meal as her struggle. After review of breakfast meal plan she needs to incorporate more protein into her meal. FBS average 110-120, 2hpp <120.  Diabetes control has improved since diabetes self-management training: Yes Number of days blood glucose is >200: None Last MD appointment for diabetes: Yesterday Changes in treatment plan: small change in breakfast Confidence with ability to manage diabetes: High Areas for improvement with diabetes self-care: Breakfast Willingness to participate in diabetes support group: Not at this time  PLAN: Discontinue Honey nut cheerios and switch to Special K Vanilla Almond to increase protein intake Continue exercise walking 3 times per week, use the stairs at work Continue testing glucose BID, FBS and consider 2hpp   KEEP UP THE FABULOUS WORK!.... YOU COULD BE OUR POSTER CHILD  Follow-Up Plan: Patient to call and schedule as needed.

## 2013-08-12 ENCOUNTER — Encounter: Payer: Self-pay | Admitting: Internal Medicine

## 2013-09-20 HISTORY — PX: COLONOSCOPY: SHX174

## 2013-09-23 ENCOUNTER — Ambulatory Visit (AMBULATORY_SURGERY_CENTER): Payer: Self-pay | Admitting: *Deleted

## 2013-09-23 VITALS — Ht 67.0 in | Wt 247.8 lb

## 2013-09-23 DIAGNOSIS — Z1211 Encounter for screening for malignant neoplasm of colon: Secondary | ICD-10-CM

## 2013-09-23 MED ORDER — NA SULFATE-K SULFATE-MG SULF 17.5-3.13-1.6 GM/177ML PO SOLN: 1.0000 | Freq: Once | ORAL | Status: DC

## 2013-09-23 NOTE — Progress Notes (Signed)
No allergies to eggs or soy. No problems with anesthesia.  

## 2013-09-24 ENCOUNTER — Encounter: Payer: Self-pay | Admitting: Internal Medicine

## 2013-10-07 ENCOUNTER — Encounter: Payer: Self-pay | Admitting: Internal Medicine

## 2013-10-07 ENCOUNTER — Ambulatory Visit (AMBULATORY_SURGERY_CENTER): Payer: BC Managed Care – PPO | Admitting: Internal Medicine

## 2013-10-07 VITALS — BP 131/56 | HR 61 | Temp 97.0°F | Resp 18 | Ht 67.0 in | Wt 247.0 lb

## 2013-10-07 DIAGNOSIS — Z1211 Encounter for screening for malignant neoplasm of colon: Secondary | ICD-10-CM

## 2013-10-07 LAB — GLUCOSE, CAPILLARY
Glucose-Capillary: 112 mg/dL — ABNORMAL HIGH (ref 70–99)
Glucose-Capillary: 97 mg/dL (ref 70–99)

## 2013-10-07 MED ORDER — SODIUM CHLORIDE 0.9 % IV SOLN: 500.0000 mL | INTRAVENOUS | Status: DC

## 2013-10-07 NOTE — Op Note (Signed)
[facility] [address] [place], [id number]   COLONOSCOPY PROCEDURE REPORT  PATIENT: [name], Rhonda M.  MR#: [id number] BIRTHDATE: June 16, 1951 , 62  yrs. old GENDER: Female ENDOSCOPIST: Iva Booparl E Arisa Congleton, MD, [facility] PROCEDURE DATE:  10/07/2013 PROCEDURE:   Colonoscopy, screening First Screening Colonoscopy - Avg.  risk and is 50 yrs.  old or older - No.  Prior Negative Screening - Now for repeat screening. 10 or more years since last screening  History of Adenoma - Now for follow-up colonoscopy & has been > or = to 3 yrs.  N/A  Polyps Removed Today? No.  Recommend repeat exam, <10 yrs? No. ASA CLASS:   Class II INDICATIONS:average risk screening and Last colonoscopy performed 10 years ago. MEDICATIONS: Propofol (Diprivan) 270 mg IV, MAC sedation, administered by CRNA, and These medications were titrated to patient response per physician's verbal order  DESCRIPTION OF PROCEDURE:   After the risks benefits and alternatives of the procedure were thoroughly explained, informed consent was obtained.  A digital rectal exam revealed no abnormalities of the rectum.   The LB [id number]  endoscope was introduced through the anus and advanced to the cecum, which was identified by both the appendix and ileocecal valve. No adverse events experienced.   The quality of the prep was excellent using Suprep  The instrument was then slowly withdrawn as the colon was fully examined.      COLON FINDINGS: A normal appearing cecum, ileocecal valve, and appendiceal orifice were identified.  The ascending, hepatic flexure, transverse, splenic flexure, descending, sigmoid colon and rectum appeared unremarkable.  No polyps or cancers were seen. Retroflexed views revealed no abnormalities. The time to cecum=2 minutes 21 seconds.  Withdrawal time=10 minutes 36 seconds.  The scope was withdrawn and the procedure completed. COMPLICATIONS: There were no complications.  ENDOSCOPIC  IMPRESSION: Normal colonoscopy - excellent prep - second screening  RECOMMENDATIONS: Repeat colonoscopy 10 years.   eSigned:  Iva Booparl E Luwana Butrick, MD, [facility] 10/07/2013 10:47 AM   cc: The Patient  and Dr. Hyman HopesJegede

## 2013-10-07 NOTE — Patient Instructions (Addendum)
The colonoscopy was normal and prep was great!  Next routine colonoscopy in 10 years - 2025  I appreciate the opportunity to care for you. Iva Booparl E. Gessner, MD, FACG YOU HAD AN ENDOSCOPIC PROCEDURE TODAY AT THE [facility]: Refer to the procedure report that was given to you for any specific questions about what was found during the examination.  If the procedure report does not answer your questions, please call your gastroenterologist to clarify.  If you requested that your care partner not be given the details of your procedure findings, then the procedure report has been included in a sealed envelope for you to review at your convenience later.  YOU SHOULD EXPECT: Some feelings of bloating in the abdomen. Passage of more gas than usual.  Walking can help get rid of the air that was put into your GI tract during the procedure and reduce the bloating. If you had a lower endoscopy (such as a colonoscopy or flexible sigmoidoscopy) you may notice spotting of blood in your stool or on the toilet paper. If you underwent a bowel prep for your procedure, then you may not have a normal bowel movement for a few days.  DIET: Your first meal following the procedure should be a light meal and then it is ok to progress to your normal diet.  A half-sandwich or bowl of soup is an example of a good first meal.  Heavy or fried foods are harder to digest and may make you feel nauseous or bloated.  Likewise meals heavy in dairy and vegetables can cause extra gas to form and this can also increase the bloating.  Drink plenty of fluids but you should avoid alcoholic beverages for 24 hours.  ACTIVITY: Your care partner should take you home directly after the procedure.  You should plan to take it easy, moving slowly for the rest of the day.  You can resume normal activity the day after the procedure however you should NOT DRIVE or use heavy machinery for 24 hours (because of the sedation medicines used during  the test).    SYMPTOMS TO REPORT IMMEDIATELY: A gastroenterologist can be reached at any hour.  During normal business hours, 8:30 AM to 5:00 PM Monday through Friday, call [phone].  After hours and on weekends, please call the GI answering service at [phone] who will take a message and have the physician on call contact you.   Following lower endoscopy (colonoscopy or flexible sigmoidoscopy):  Excessive amounts of blood in the stool  Significant tenderness or worsening of abdominal pains  Swelling of the abdomen that is new, acute  Fever of 100F or higher   FOLLOW UP: If any biopsies were taken you will be contacted by phone or by letter within the next 1-3 weeks.  Call your gastroenterologist if you have not heard about the biopsies in 3 weeks.  Our staff will call the home number listed on your records the next business day following your procedure to check on you and address any questions or concerns that you may have at that time regarding the information given to you following your procedure. This is a courtesy call and so if there is no answer at the home number and we have not heard from you through the emergency physician on call, we will assume that you have returned to your regular daily activities without incident.  SIGNATURES/CONFIDENTIALITY: You and/or your care partner have signed paperwork which will be entered into your electronic medical  record.  These signatures attest to the fact that that the information above on your After Visit Summary has been reviewed and is understood.  Full responsibility of the confidentiality of this discharge information lies with you and/or your care-partner. 

## 2013-10-07 NOTE — Progress Notes (Signed)
Report to pacu rn, vss, bbs=clear 

## 2013-10-08 ENCOUNTER — Telehealth: Payer: Self-pay | Admitting: *Deleted

## 2013-10-08 NOTE — Telephone Encounter (Signed)
  Follow up Call-  Call back number 10/07/2013  Post procedure Call Back phone  # [phone]  Permission to leave phone message Yes     Patient questions:  Do you have a fever, pain , or abdominal swelling? no Pain Score  0 *  Have you tolerated food without any problems? yes  Have you been able to return to your normal activities? yes  Do you have any questions about your discharge instructions: Diet   no Medications  no Follow up visit  no  Do you have questions or concerns about your Care? no  Actions: * If pain score is 4 or above: No action needed, pain <4.

## 2013-10-26 ENCOUNTER — Encounter: Payer: Self-pay | Admitting: Internal Medicine

## 2013-10-26 ENCOUNTER — Ambulatory Visit: Payer: BC Managed Care – PPO | Attending: Internal Medicine | Admitting: Internal Medicine

## 2013-10-26 ENCOUNTER — Ambulatory Visit: Payer: PRIVATE HEALTH INSURANCE | Admitting: Internal Medicine

## 2013-10-26 VITALS — BP 126/86 | HR 74 | Temp 98.0°F | Resp 16 | Wt 246.0 lb

## 2013-10-26 DIAGNOSIS — Z87891 Personal history of nicotine dependence: Secondary | ICD-10-CM | POA: Insufficient documentation

## 2013-10-26 DIAGNOSIS — K219 Gastro-esophageal reflux disease without esophagitis: Secondary | ICD-10-CM | POA: Insufficient documentation

## 2013-10-26 DIAGNOSIS — Z09 Encounter for follow-up examination after completed treatment for conditions other than malignant neoplasm: Secondary | ICD-10-CM | POA: Insufficient documentation

## 2013-10-26 DIAGNOSIS — IMO0001 Reserved for inherently not codable concepts without codable children: Secondary | ICD-10-CM | POA: Insufficient documentation

## 2013-10-26 DIAGNOSIS — E119 Type 2 diabetes mellitus without complications: Secondary | ICD-10-CM | POA: Diagnosis not present

## 2013-10-26 DIAGNOSIS — E1165 Type 2 diabetes mellitus with hyperglycemia: Secondary | ICD-10-CM

## 2013-10-26 LAB — COMPLETE METABOLIC PANEL WITH GFR
ALBUMIN: 3.9 g/dL (ref 3.5–5.2)
ALT: 17 U/L (ref 0–35)
AST: 14 U/L (ref 0–37)
Alkaline Phosphatase: 65 U/L (ref 39–117)
BUN: 16 mg/dL (ref 6–23)
CALCIUM: 9.3 mg/dL (ref 8.4–10.5)
CO2: 27 meq/L (ref 19–32)
CREATININE: 0.59 mg/dL (ref 0.50–1.10)
Chloride: 101 mEq/L (ref 96–112)
GFR, Est Non African American: >89 mL/min
GLUCOSE: 98 mg/dL (ref 70–99)
POTASSIUM: 4.4 meq/L (ref 3.5–5.3)
Sodium: 138 mEq/L (ref 135–145)
Total Bilirubin: 0.8 mg/dL (ref 0.2–1.2)
Total Protein: 7.1 g/dL (ref 6.0–8.3)

## 2013-10-26 LAB — CBC WITH DIFFERENTIAL/PLATELET
Basophils Absolute: 0 10*3/uL (ref 0.0–0.1)
Basophils Relative: 0 % (ref 0–1)
EOS PCT: 1 % (ref 0–5)
Eosinophils Absolute: 0.1 10*3/uL (ref 0.0–0.7)
HCT: 39 % (ref 36.0–46.0)
Hemoglobin: 12.9 g/dL (ref 12.0–15.0)
LYMPHS PCT: 41 % (ref 12–46)
Lymphs Abs: 2.4 10*3/uL (ref 0.7–4.0)
MCH: 26.1 pg (ref 26.0–34.0)
MCHC: 33.1 g/dL (ref 30.0–36.0)
MCV: 78.9 fL (ref 78.0–100.0)
Monocytes Absolute: 0.3 10*3/uL (ref 0.1–1.0)
Monocytes Relative: 6 % (ref 3–12)
NEUTROS PCT: 52 % (ref 43–77)
Neutro Abs: 3 10*3/uL (ref 1.7–7.7)
PLATELETS: 287 10*3/uL (ref 150–400)
RBC: 4.94 MIL/uL (ref 3.87–5.11)
RDW: 15.6 % — ABNORMAL HIGH (ref 11.5–15.5)
WBC: 5.8 10*3/uL (ref 4.0–10.5)

## 2013-10-26 LAB — LIPID PANEL
CHOLESTEROL: 138 mg/dL (ref 0–200)
HDL: 42 mg/dL (ref >39)
LDL Cholesterol: 81 mg/dL (ref 0–99)
Total CHOL/HDL Ratio: 3.3 Ratio
Triglycerides: 76 mg/dL (ref <150)
VLDL: 15 mg/dL (ref 0–40)

## 2013-10-26 LAB — GLUCOSE, POCT (MANUAL RESULT ENTRY): POC Glucose: 113 mg/dl — AB (ref 70–99)

## 2013-10-26 LAB — TSH: TSH: 0.436 u[IU]/mL (ref 0.350–4.500)

## 2013-10-26 LAB — POCT GLYCOSYLATED HEMOGLOBIN (HGB A1C): Hemoglobin A1C: 6.8

## 2013-10-26 NOTE — Progress Notes (Signed)
Patient here for follow up DM 

## 2013-10-26 NOTE — Progress Notes (Signed)
Patient ID: Rhonda Miles, female   DOB: 1951-03-16, 63 y.o.   MRN: [id number]   CC:  HPI: 63 year old female with history of uncontrolled diabetes, here for a diabetes followup. A1c in January was 5.9. Today it is 6.8. Patient is religiously monitoring her CBGs at home and is compliant with her medications. She does not report anything above 150 postprandial. Denies any blurry vision. She does complain of weight gain, and needs to start exercising. She had an ophthalmology examination within the last year. Requesting a referral for a mammogram  Allergies  Allergen Reactions  . Asa [Aspirin] Other (See Comments)    shakey   Past Medical History  Diagnosis Date  . Diabetes mellitus without complication   . GERD (gastroesophageal reflux disease)    Current Outpatient Prescriptions on File Prior to Visit  Medication Sig Dispense Refill  . glucose blood test strip Use as instructed  100 each  12  . metFORMIN (GLUCOPHAGE) 500 MG tablet Take 1 tablet (500 mg total) by mouth 2 (two) times daily with a meal.  180 tablet  3   No current facility-administered medications on file prior to visit.   Family History  Problem Relation Age of Onset  . Diabetes Father   . Hypertension Mother   . Colon cancer Maternal Grandmother 93   History   Social History  . Marital Status: Married    Spouse Name: N/A    Number of Children: N/A  . Years of Education: N/A   Occupational History  . Not on file.   Social History Main Topics  . Smoking status: Former Smoker    Quit date: 10/22/1983  . Smokeless tobacco: Never Used  . Alcohol Use: No  . Drug Use: No  . Sexual Activity: Not on file   Other Topics Concern  . Not on file   Social History Narrative  . No narrative on file    Review of Systems  Constitutional: Negative for fever, chills, diaphoresis, activity change, appetite change and fatigue.  HENT: Negative for ear pain, nosebleeds, congestion, facial swelling, rhinorrhea, neck  pain, neck stiffness and ear discharge.   Eyes: Negative for pain, discharge, redness, itching and visual disturbance.  Respiratory: Negative for cough, choking, chest tightness, shortness of breath, wheezing and stridor.   Cardiovascular: Negative for chest pain, palpitations and leg swelling.  Gastrointestinal: Negative for abdominal distention.  Genitourinary: Negative for dysuria, urgency, frequency, hematuria, flank pain, decreased urine volume, difficulty urinating and dyspareunia.  Musculoskeletal: Negative for back pain, joint swelling, arthralgias and gait problem.  Neurological: Negative for dizziness, tremors, seizures, syncope, facial asymmetry, speech difficulty, weakness, light-headedness, numbness and headaches.  Hematological: Negative for adenopathy. Does not bruise/bleed easily.  Psychiatric/Behavioral: Negative for hallucinations, behavioral problems, confusion, dysphoric mood, decreased concentration and agitation.    Objective:   Filed Vitals:   10/26/13 1136  BP: 126/86  Pulse: 74  Temp: 98 F (36.7 C)  Resp: 16    Physical Exam  Constitutional: Appears well-developed and well-nourished. No distress.  HENT: Normocephalic. External right and left ear normal. Oropharynx is clear and moist.  Eyes: Conjunctivae and EOM are normal. PERRLA, no scleral icterus.  Neck: Normal ROM. Neck supple. No JVD. No tracheal deviation. No thyromegaly.  CVS: RRR, S1/S2 +, no murmurs, no gallops, no carotid bruit.  Pulmonary: Effort and breath sounds normal, no stridor, rhonchi, wheezes, rales.  Abdominal: Soft. BS +,  no distension, tenderness, rebound or guarding.  Musculoskeletal: Normal range of motion. No edema  and no tenderness.  Lymphadenopathy: No lymphadenopathy noted, cervical, inguinal. Neuro: Alert. Normal reflexes, muscle tone coordination. No cranial nerve deficit. Skin: Skin is warm and dry. No rash noted. Not diaphoretic. No erythema. No pallor.  Psychiatric: Normal  mood and affect. Behavior, judgment, thought content normal.   Lab Results  Component Value Date   WBC 6.9 02/14/2013   HGB 13.1 02/14/2013   HCT 40.7 02/14/2013   MCV 80.8 02/14/2013   PLT 217 02/14/2013   Lab Results  Component Value Date   CREATININE 0.54 02/14/2013   BUN 10 02/14/2013   NA 138 02/14/2013   K 3.6 02/14/2013   CL 100 02/14/2013   CO2 29 02/14/2013    Lab Results  Component Value Date   HGBA1C 6.8% 10/26/2013   Lipid Panel     Component Value Date/Time   CHOL 138 03/12/2013 1438   TRIG 76 03/12/2013 1438   HDL 46 03/12/2013 1438   CHOLHDL 3.0 03/12/2013 1438   VLDL 15 03/12/2013 1438   LDLCALC 77 03/12/2013 1438       Assessment and plan:   Patient Active Problem List   Diagnosis Date Noted  . Diabetes mellitus, new onset 03/12/2013  . Newly diagnosed diabetes 02/14/2013  . Hypokalemia 02/14/2013  . Chest pain 02/13/2013  . Obesity 02/13/2013   Diabetes Continue metformin Patient motivated to lose weight A1c will further improve hopefully Check lipid panel  Health maintenance Repeat vitamin D and baseline labs Referral for a mammogram provider Patient recently had a colonoscopy that was negative          The patient was given clear instructions to go to ER or return to medical center if symptoms don't improve, worsen or new problems develop. The patient verbalized understanding. The patient was told to call to get any lab results if not heard anything in the next week.

## 2013-10-27 ENCOUNTER — Other Ambulatory Visit: Payer: Self-pay | Admitting: Internal Medicine

## 2013-10-27 DIAGNOSIS — Z1231 Encounter for screening mammogram for malignant neoplasm of breast: Secondary | ICD-10-CM

## 2013-10-27 LAB — VITAMIN D 25 HYDROXY (VIT D DEFICIENCY, FRACTURES): Vit D, 25-Hydroxy: 29 ng/mL — ABNORMAL LOW (ref 30–89)

## 2013-10-30 ENCOUNTER — Encounter: Payer: Self-pay | Admitting: Internal Medicine

## 2013-10-30 ENCOUNTER — Other Ambulatory Visit: Payer: Self-pay | Admitting: Emergency Medicine

## 2013-10-30 MED ORDER — BLOOD GLUCOSE METER KIT: PACK | Status: DC

## 2013-11-02 ENCOUNTER — Telehealth: Payer: Self-pay | Admitting: Emergency Medicine

## 2013-11-02 NOTE — Telephone Encounter (Signed)
Message copied by Darlis LoanSMITH, Marsalis Beaulieu D on Mon Nov 02, 2013  5:05 PM ------      Message from: Susie CassetteABROL MD, Annie Jeffrey [facility] Center[facility]YA[facility]      Created: Fri Oct 30, 2013  3:58 PM       Labs unfortunately normal, slightly low vitamin D, patient can start taking vitamin D. 2000 international unit daily over-the-counter ------

## 2013-11-02 NOTE — Telephone Encounter (Signed)
Pt given lab results with instructions 

## 2013-11-04 ENCOUNTER — Ambulatory Visit (HOSPITAL_COMMUNITY)
Admission: RE | Admit: 2013-11-04 | Discharge: 2013-11-04 | Disposition: A | Discharge status: Home / Self Care | Payer: BC Managed Care – PPO | Source: Ambulatory Visit | Attending: Internal Medicine | Admitting: Internal Medicine

## 2013-11-04 DIAGNOSIS — Z1231 Encounter for screening mammogram for malignant neoplasm of breast: Secondary | ICD-10-CM | POA: Insufficient documentation

## 2014-01-26 ENCOUNTER — Encounter: Payer: Self-pay | Admitting: Internal Medicine

## 2014-01-26 ENCOUNTER — Ambulatory Visit: Payer: BC Managed Care – PPO | Attending: Internal Medicine | Admitting: Internal Medicine

## 2014-01-26 DIAGNOSIS — E119 Type 2 diabetes mellitus without complications: Secondary | ICD-10-CM | POA: Insufficient documentation

## 2014-01-26 DIAGNOSIS — K219 Gastro-esophageal reflux disease without esophagitis: Secondary | ICD-10-CM | POA: Insufficient documentation

## 2014-01-26 DIAGNOSIS — E139 Other specified diabetes mellitus without complications: Secondary | ICD-10-CM

## 2014-01-26 DIAGNOSIS — E089 Diabetes mellitus due to underlying condition without complications: Secondary | ICD-10-CM | POA: Insufficient documentation

## 2014-01-26 DIAGNOSIS — Z87891 Personal history of nicotine dependence: Secondary | ICD-10-CM | POA: Insufficient documentation

## 2014-01-26 LAB — GLUCOSE, POCT (MANUAL RESULT ENTRY): POC GLUCOSE: 107 mg/dL — AB (ref 70–99)

## 2014-01-26 LAB — POCT GLYCOSYLATED HEMOGLOBIN (HGB A1C): Hemoglobin A1C: 6.5

## 2014-01-26 NOTE — Patient Instructions (Signed)
dDiabetes Mellitus and Food It is important for you to manage your blood sugar (glucose) level. Your blood glucose level can be greatly affected by what you eat. Eating healthier foods in the appropriate amounts throughout the day at about the same time each day will help you control your blood glucose level. It can also help slow or prevent worsening of your diabetes mellitus. Healthy eating may even help you improve the level of your blood pressure and reach or maintain a healthy weight.  HOW CAN FOOD AFFECT ME? Carbohydrates Carbohydrates affect your blood glucose level more than any other type of food. Your dietitian will help you determine how many carbohydrates to eat at each meal and teach you how to count carbohydrates. Counting carbohydrates is important to keep your blood glucose at a healthy level, especially if you are using insulin or taking certain medicines for diabetes mellitus. Alcohol Alcohol can cause sudden decreases in blood glucose (hypoglycemia), especially if you use insulin or take certain medicines for diabetes mellitus. Hypoglycemia can be a life-threatening condition. Symptoms of hypoglycemia (sleepiness, dizziness, and disorientation) are similar to symptoms of having too much alcohol.  If your health care provider has given you approval to drink alcohol, do so in moderation and use the following guidelines:  Women should not have more than one drink per day, and men should not have more than two drinks per day. One drink is equal to:  12 oz of beer.  5 oz of wine.  1 oz of hard liquor.  Do not drink on an empty stomach.  Keep yourself hydrated. Have water, diet soda, or unsweetened iced tea.  Regular soda, juice, and other mixers might contain a lot of carbohydrates and should be counted. WHAT FOODS ARE NOT RECOMMENDED? As you make food choices, it is important to remember that all foods are not the same. Some foods have fewer nutrients per serving than other  foods, even though they might have the same number of calories or carbohydrates. It is difficult to get your body what it needs when you eat foods with fewer nutrients. Examples of foods that you should avoid that are high in calories and carbohydrates but low in nutrients include:  Trans fats (most processed foods list trans fats on the Nutrition Facts label).  Regular soda.  Juice.  Candy.  Sweets, such as cake, pie, doughnuts, and cookies.  Fried foods. WHAT FOODS CAN I EAT? Have nutrient-rich foods, which will nourish your body and keep you healthy. The food you should eat also will depend on several factors, including:  The calories you need.  The medicines you take.  Your weight.  Your blood glucose level.  Your blood pressure level.  Your cholesterol level. You also should eat a variety of foods, including:  Protein, such as meat, poultry, fish, tofu, nuts, and seeds (lean animal proteins are best).  Fruits.  Vegetables.  Dairy products, such as milk, cheese, and yogurt (low fat is best).  Breads, grains, pasta, cereal, rice, and beans.  Fats such as olive oil, trans fat-free margarine, canola oil, avocado, and olives. DOES EVERYONE WITH DIABETES MELLITUS HAVE THE SAME MEAL PLAN? Because every person with diabetes mellitus is different, there is not one meal plan that works for everyone. It is very important that you meet with a dietitian who will help you create a meal plan that is just right for you. Document Released: 04/05/2005 Document Revised: 07/14/2013 Document Reviewed: 06/05/2013 Exit[employer] Patient Information 2015 [employer]. This   information is not intended to replace advice given to you by your health care provider. Make sure you discuss any questions you have with your health care provider.  

## 2014-01-26 NOTE — Progress Notes (Signed)
Patient here for follow up on her DM 

## 2014-01-26 NOTE — Progress Notes (Signed)
MRN: [id number] Name: Rhonda Miles  Sex: female Age: 63 y.o. DOB: September 21, 1950  Allergies: Diona Fanti  Chief Complaint  Patient presents with  . Follow-up    HPI: Patient is 63 y.o. female who has history of diabetes comes today for followup, on the last visit her Amaryl was discontinued as her sugar was dropping too much, currently patient is taking only metformin 500 mg twice a day denies any hypoglycemic symptoms, today her hemoglobin A1c is 6.5%, her blood pressure is well controlled. Patient is up-to-date with mammogram and already had a colonoscopy done.  Past Medical History  Diagnosis Date  . Diabetes mellitus without complication   . GERD (gastroesophageal reflux disease)     Past Surgical History  Procedure Laterality Date  . Vaginal hysterectomy  2008  . Laceration repair Right 1957    ankle from lawnmower accident      Medication List       This list is accurate as of: 01/26/14 12:27 PM.  Always use your most recent med list.               blood glucose meter kit and supplies  Use as instructed     glucose blood test strip  Use as instructed     metFORMIN 500 MG tablet  Commonly known as:  GLUCOPHAGE  Take 1 tablet (500 mg total) by mouth 2 (two) times daily with a meal.        No orders of the defined types were placed in this encounter.     There is no immunization history on file for this patient.  Family History  Problem Relation Age of Onset  . Diabetes Father   . Hypertension Mother   . Colon cancer Maternal Grandmother 60    History  Substance Use Topics  . Smoking status: Former Smoker    Quit date: 10/22/1983  . Smokeless tobacco: Never Used  . Alcohol Use: No    Review of Systems   As noted in HPI  There were no vitals filed for this visit.  Physical Exam  Physical Exam  Constitutional: No distress.  Eyes: EOM are normal. Pupils are equal, round, and reactive to light.  Cardiovascular: Normal rate and regular  rhythm.   Pulmonary/Chest: Breath sounds normal. No respiratory distress. She has no wheezes. She has no rales.    CBC    Component Value Date/Time   WBC 5.8 10/26/2013 1154   RBC 4.94 10/26/2013 1154   HGB 12.9 10/26/2013 1154   HCT 39.0 10/26/2013 1154   PLT 287 10/26/2013 1154   MCV 78.9 10/26/2013 1154   LYMPHSABS 2.4 10/26/2013 1154   MONOABS 0.3 10/26/2013 1154   EOSABS 0.1 10/26/2013 1154   BASOSABS 0.0 10/26/2013 1154    CMP     Component Value Date/Time   NA 138 10/26/2013 1154   K 4.4 10/26/2013 1154   CL 101 10/26/2013 1154   CO2 27 10/26/2013 1154   GLUCOSE 98 10/26/2013 1154   BUN 16 10/26/2013 1154   CREATININE 0.59 10/26/2013 1154   CREATININE 0.54 02/14/2013 0015   CALCIUM 9.3 10/26/2013 1154   PROT 7.1 10/26/2013 1154   ALBUMIN 3.9 10/26/2013 1154   AST 14 10/26/2013 1154   ALT 17 10/26/2013 1154   ALKPHOS 65 10/26/2013 1154   BILITOT 0.8 10/26/2013 1154   GFRNONAA >89 10/26/2013 1154   GFRNONAA >90 02/14/2013 0015   GFRAA >89 10/26/2013 1154   GFRAA >90 02/14/2013 0015  Lab Results  Component Value Date/Time   CHOL 138 10/26/2013 11:54 AM    No components found with this basename: hga1c    Lab Results  Component Value Date/Time   AST 14 10/26/2013 11:54 AM    Assessment and Plan  Diabetes mellitus due to underlying condition without complications - Plan:  Results for orders placed in visit on 01/26/14  GLUCOSE, POCT (MANUAL RESULT ENTRY)      Result Value Ref Range   POC Glucose 107 (*) 70 - 99 mg/dl  POCT GLYCOSYLATED HEMOGLOBIN (HGB A1C)      Result Value Ref Range   Hemoglobin A1C 6.5     Diabetes is well controlled, she'll continue with metformin 500 mg 3 times a day, continue with diabetes meal planning, keep the fingerstick log. Blood pressure is well controlled, last LDL at goal continue with diet modification, up-to-date with ophthalmology evaluation.   Health Maintenance -Colonoscopy: uptodate   -Mammogram:uptodate    Return in about 3 months (around 04/28/2014) for  diabetes.  Lorayne Marek, MD

## 2014-03-30 ENCOUNTER — Encounter: Payer: Self-pay | Admitting: Internal Medicine

## 2014-03-31 ENCOUNTER — Telehealth: Payer: Self-pay | Admitting: Internal Medicine

## 2014-03-31 NOTE — Telephone Encounter (Signed)
Pt. Called to request a refill on metFORMIN (GLUCOPHAGE) 500 MG tablet. Please f/u with pt.

## 2014-04-05 NOTE — Telephone Encounter (Signed)
Pt. Called to request a refill on metFORMIN (GLUCOPHAGE) 500 MG tablet. Please f/u with pt.

## 2014-04-06 MED ORDER — METFORMIN HCL 500 MG PO TABS: 500.0000 mg | ORAL_TABLET | Freq: Two times a day (BID) | ORAL | Status: DC

## 2014-04-06 NOTE — Telephone Encounter (Signed)
Metformin 500 mg BID #60 no refills e-scribed to [facility] on [address]. Patient aware that she needs to f/u with PCP in October 2015

## 2014-04-27 ENCOUNTER — Encounter: Payer: Self-pay | Admitting: Internal Medicine

## 2014-04-27 ENCOUNTER — Ambulatory Visit: Payer: BC Managed Care – PPO | Attending: Internal Medicine | Admitting: Internal Medicine

## 2014-04-27 VITALS — BP 133/75 | HR 92 | Temp 98.4°F | Resp 17 | Wt 250.6 lb

## 2014-04-27 DIAGNOSIS — Z0000 Encounter for general adult medical examination without abnormal findings: Secondary | ICD-10-CM

## 2014-04-27 DIAGNOSIS — Z23 Encounter for immunization: Secondary | ICD-10-CM | POA: Diagnosis not present

## 2014-04-27 DIAGNOSIS — E119 Type 2 diabetes mellitus without complications: Secondary | ICD-10-CM | POA: Diagnosis present

## 2014-04-27 DIAGNOSIS — Z833 Family history of diabetes mellitus: Secondary | ICD-10-CM | POA: Insufficient documentation

## 2014-04-27 DIAGNOSIS — Z139 Encounter for screening, unspecified: Secondary | ICD-10-CM

## 2014-04-27 DIAGNOSIS — Z09 Encounter for follow-up examination after completed treatment for conditions other than malignant neoplasm: Secondary | ICD-10-CM | POA: Diagnosis not present

## 2014-04-27 DIAGNOSIS — Z8249 Family history of ischemic heart disease and other diseases of the circulatory system: Secondary | ICD-10-CM | POA: Diagnosis not present

## 2014-04-27 DIAGNOSIS — Z800 Family history of malignant neoplasm of digestive organs: Secondary | ICD-10-CM | POA: Insufficient documentation

## 2014-04-27 DIAGNOSIS — E089 Diabetes mellitus due to underlying condition without complications: Secondary | ICD-10-CM | POA: Diagnosis not present

## 2014-04-27 DIAGNOSIS — K219 Gastro-esophageal reflux disease without esophagitis: Secondary | ICD-10-CM | POA: Insufficient documentation

## 2014-04-27 DIAGNOSIS — M25552 Pain in left hip: Secondary | ICD-10-CM | POA: Diagnosis not present

## 2014-04-27 LAB — POCT GLYCOSYLATED HEMOGLOBIN (HGB A1C): Hemoglobin A1C: 7.2

## 2014-04-27 LAB — GLUCOSE, POCT (MANUAL RESULT ENTRY): POC Glucose: 138 mg/dl — AB (ref 70–99)

## 2014-04-27 MED ORDER — METFORMIN HCL 850 MG PO TABS: 850.0000 mg | ORAL_TABLET | Freq: Two times a day (BID) | ORAL | Status: DC

## 2014-04-27 NOTE — Patient Instructions (Signed)
Diabetes Mellitus and Food It is important for you to manage your blood sugar (glucose) level. Your blood glucose level can be greatly affected by what you eat. Eating healthier foods in the appropriate amounts throughout the day at about the same time each day will help you control your blood glucose level. It can also help slow or prevent worsening of your diabetes mellitus. Healthy eating may even help you improve the level of your blood pressure and reach or maintain a healthy weight.  HOW CAN FOOD AFFECT ME? Carbohydrates Carbohydrates affect your blood glucose level more than any other type of food. Your dietitian will help you determine how many carbohydrates to eat at each meal and teach you how to count carbohydrates. Counting carbohydrates is important to keep your blood glucose at a healthy level, especially if you are using insulin or taking certain medicines for diabetes mellitus. Alcohol Alcohol can cause sudden decreases in blood glucose (hypoglycemia), especially if you use insulin or take certain medicines for diabetes mellitus. Hypoglycemia can be a life-threatening condition. Symptoms of hypoglycemia (sleepiness, dizziness, and disorientation) are similar to symptoms of having too much alcohol.  If your health care provider has given you approval to drink alcohol, do so in moderation and use the following guidelines:  Women should not have more than one drink per day, and men should not have more than two drinks per day. One drink is equal to:  12 oz of beer.  5 oz of wine.  1 oz of hard liquor.  Do not drink on an empty stomach.  Keep yourself hydrated. Have water, diet soda, or unsweetened iced tea.  Regular soda, juice, and other mixers might contain a lot of carbohydrates and should be counted. WHAT FOODS ARE NOT RECOMMENDED? As you make food choices, it is important to remember that all foods are not the same. Some foods have fewer nutrients per serving than other  foods, even though they might have the same number of calories or carbohydrates. It is difficult to get your body what it needs when you eat foods with fewer nutrients. Examples of foods that you should avoid that are high in calories and carbohydrates but low in nutrients include:  Trans fats (most processed foods list trans fats on the Nutrition Facts label).  Regular soda.  Juice.  Candy.  Sweets, such as cake, pie, doughnuts, and cookies.  Fried foods. WHAT FOODS CAN I EAT? Have nutrient-rich foods, which will nourish your body and keep you healthy. The food you should eat also will depend on several factors, including:  The calories you need.  The medicines you take.  Your weight.  Your blood glucose level.  Your blood pressure level.  Your cholesterol level. You also should eat a variety of foods, including:  Protein, such as meat, poultry, fish, tofu, nuts, and seeds (lean animal proteins are best).  Fruits.  Vegetables.  Dairy products, such as milk, cheese, and yogurt (low fat is best).  Breads, grains, pasta, cereal, rice, and beans.  Fats such as olive oil, trans fat-free margarine, canola oil, avocado, and olives. DOES EVERYONE WITH DIABETES MELLITUS HAVE THE SAME MEAL PLAN? Because every person with diabetes mellitus is different, there is not one meal plan that works for everyone. It is very important that you meet with a dietitian who will help you create a meal plan that is just right for you. Document Released: 04/05/2005 Document Revised: 07/14/2013 Document Reviewed: 06/05/2013 [employer] Patient Information 2015 [employer]. This   information is not intended to replace advice given to you by your health care provider. Make sure you discuss any questions you have with your health care provider.  

## 2014-04-27 NOTE — Progress Notes (Signed)
MRN: [id number] Name: Rhonda Miles  Sex: female Age: 63 y.o. DOB: 1950/08/03  Allergies: Diona Fanti  Chief Complaint  Patient presents with  . Follow-up    dm    HPI: Patient is 63 y.o. female who has history of diabetes comes today for followup, currently she's taking metformin 500 mg twice a day, she denies any hypoglycemic symptoms, her hemoglobin A1c today is 7.2% which has trended up compared to last visit, patient denies any headache dizziness chest and shortness of breath, she does complain of left pain which has been on and off denies any trauma, she takes Aleve which helped her with the symptoms.  Past Medical History  Diagnosis Date  . Diabetes mellitus without complication   . GERD (gastroesophageal reflux disease)     Past Surgical History  Procedure Laterality Date  . Vaginal hysterectomy  2008  . Laceration repair Right 1957    ankle from lawnmower accident      Medication List       This list is accurate as of: 04/27/14  4:33 PM.  Always use your most recent med list.               blood glucose meter kit and supplies  Use as instructed     glucose blood test strip  Use as instructed     metFORMIN 850 MG tablet  Commonly known as:  GLUCOPHAGE  Take 1 tablet (850 mg total) by mouth 2 (two) times daily with a meal.        Meds ordered this encounter  Medications  . metFORMIN (GLUCOPHAGE) 850 MG tablet    Sig: Take 1 tablet (850 mg total) by mouth 2 (two) times daily with a meal.    Dispense:  60 tablet    Refill:  3    Immunization History  Administered Date(s) Administered  . Influenza,inj,Quad PF,36+ Mos 04/27/2014    Family History  Problem Relation Age of Onset  . Diabetes Father   . Hypertension Mother   . Colon cancer Maternal Grandmother 60    History  Substance Use Topics  . Smoking status: Former Smoker    Quit date: 10/22/1983  . Smokeless tobacco: Never Used  . Alcohol Use: No    Review of Systems   As noted in  HPI  Filed Vitals:   04/27/14 1601  BP: 133/75  Pulse: 92  Temp: 98.4 F (36.9 C)  Resp: 17    Physical Exam  Physical Exam  Constitutional: No distress.  Eyes: EOM are normal. Pupils are equal, round, and reactive to light.  Cardiovascular: Normal rate and regular rhythm.   Pulmonary/Chest: Breath sounds normal. No respiratory distress. She has no wheezes. She has no rales.  Musculoskeletal: She exhibits no edema.    CBC    Component Value Date/Time   WBC 5.8 10/26/2013 1154   RBC 4.94 10/26/2013 1154   HGB 12.9 10/26/2013 1154   HCT 39.0 10/26/2013 1154   PLT 287 10/26/2013 1154   MCV 78.9 10/26/2013 1154   LYMPHSABS 2.4 10/26/2013 1154   MONOABS 0.3 10/26/2013 1154   EOSABS 0.1 10/26/2013 1154   BASOSABS 0.0 10/26/2013 1154    CMP     Component Value Date/Time   NA 138 10/26/2013 1154   K 4.4 10/26/2013 1154   CL 101 10/26/2013 1154   CO2 27 10/26/2013 1154   GLUCOSE 98 10/26/2013 1154   BUN 16 10/26/2013 1154   CREATININE 0.59 10/26/2013 1154  CREATININE 0.54 02/14/2013 0015   CALCIUM 9.3 10/26/2013 1154   PROT 7.1 10/26/2013 1154   ALBUMIN 3.9 10/26/2013 1154   AST 14 10/26/2013 1154   ALT 17 10/26/2013 1154   ALKPHOS 65 10/26/2013 1154   BILITOT 0.8 10/26/2013 1154   GFRNONAA >89 10/26/2013 1154   GFRNONAA >90 02/14/2013 0015   GFRAA >89 10/26/2013 1154   GFRAA >90 02/14/2013 0015    Lab Results  Component Value Date/Time   CHOL 138 10/26/2013 11:54 AM    No components found with this basename: hga1c    Lab Results  Component Value Date/Time   AST 14 10/26/2013 11:54 AM    Assessment and Plan  Diabetes mellitus due to underlying condition without complications - Plan: Advised patient for diabetes meal planning Results for orders placed in visit on 04/27/14  GLUCOSE, POCT (MANUAL RESULT ENTRY)      Result Value Ref Range   POC Glucose 138 (*) 70 - 99 mg/dl  POCT GLYCOSYLATED HEMOGLOBIN (HGB A1C)      Result Value Ref Range   Hemoglobin A1C 7.2     A1c has trended up, I have increased  the dose of metformin metFORMIN (GLUCOPHAGE) 850 MG tablet, will repeat A1c in 3 months   Left hip pain - Plan: Ordered DG Hip Complete Left  Screening - Plan: Ambulatory referral to Gynecology for Pap smear   Health Maintenance -Colonoscopy: Up-to-date -Pap Smear: Referred to GYN -Mammogram: Up-to-date -Vaccinations: Flu shot and pneumonia shot  given today.  Return in about 3 months (around 07/28/2014) for diabetes.  Lorayne Marek, MD

## 2014-04-27 NOTE — Progress Notes (Signed)
Patient here for follow up on her Diabetes 

## 2014-04-28 ENCOUNTER — Encounter: Payer: Self-pay | Admitting: Obstetrics & Gynecology

## 2014-05-04 ENCOUNTER — Ambulatory Visit (HOSPITAL_COMMUNITY)
Admission: RE | Admit: 2014-05-04 | Discharge: 2014-05-04 | Disposition: A | Discharge status: Home / Self Care | Payer: BC Managed Care – PPO | Source: Ambulatory Visit | Attending: Internal Medicine | Admitting: Internal Medicine

## 2014-05-04 DIAGNOSIS — M25552 Pain in left hip: Secondary | ICD-10-CM | POA: Insufficient documentation

## 2014-05-10 ENCOUNTER — Telehealth: Payer: Self-pay | Admitting: *Deleted

## 2014-05-10 NOTE — Telephone Encounter (Signed)
Message copied by Dyann KiefGIRALDEZ, Alexandrina Fiorini M on Mon May 10, 2014  4:43 PM ------      Message from: Doris CheadleADVANI, DEEPAK      Created: Mon May 10, 2014  2:08 PM       Call and let the patient know that her left hip x-ray was negative and reported There is no evidence of hip fracture or dislocation. There is no      evidence of arthropathy or other focal bone abnormality.       ------

## 2014-05-10 NOTE — Telephone Encounter (Signed)
Pt aware of lab results 

## 2014-06-02 ENCOUNTER — Ambulatory Visit (INDEPENDENT_AMBULATORY_CARE_PROVIDER_SITE_OTHER): Payer: BLUE CROSS/BLUE SHIELD | Admitting: Obstetrics & Gynecology

## 2014-06-02 ENCOUNTER — Encounter: Payer: Self-pay | Admitting: Obstetrics & Gynecology

## 2014-06-02 VITALS — BP 126/56 | HR 73 | Ht 67.0 in | Wt 247.6 lb

## 2014-06-02 DIAGNOSIS — N951 Menopausal and female climacteric states: Secondary | ICD-10-CM | POA: Diagnosis not present

## 2014-06-02 MED ORDER — ESTRADIOL 1 MG PO TABS: ORAL_TABLET | ORAL | Status: DC

## 2014-06-02 NOTE — Patient Instructions (Signed)
Hormone Therapy At menopause, your body begins making less estrogen and progesterone hormones. This causes the body to stop having menstrual periods. This is because estrogen and progesterone hormones control your periods and menstrual cycle. A lack of estrogen may cause symptoms such as:  Hot flushes (or hot flashes).  Vaginal dryness.  Dry skin.  Loss of sex drive.  Risk of bone loss (osteoporosis). When this happens, you may choose to take hormone therapy to get back the estrogen lost during menopause. When the hormone estrogen is given alone, it is usually referred to as ET (Estrogen Therapy). When the hormone progestin is combined with estrogen, it is generally called HT (Hormone Therapy). This was formerly known as hormone replacement therapy (HRT). Your caregiver can help you make a decision on what will be best for you. The decision to use HT seems to change often as new studies are done. Many studies do not agree on the benefits of hormone replacement therapy. LIKELY BENEFITS OF HT INCLUDE PROTECTION FROM:  Hot Flushes (also called hot flashes) - A hot flush is a sudden feeling of heat that spreads over the face and body. The skin may redden like a blush. It is connected with sweats and sleep disturbance. Women going through menopause may have hot flushes a few times a month or several times per day depending on the woman.  Osteoporosis (bone loss)- Estrogen helps guard against bone loss. After menopause, a woman's bones slowly lose calcium and become weak and brittle. As a result, bones are more likely to break. The hip, wrist, and spine are affected most often. Hormone therapy can help slow bone loss after menopause. Weight bearing exercise and taking calcium with vitamin D also can help prevent bone loss. There are also medications that your caregiver can prescribe that can help prevent osteoporosis.  Vaginal Dryness - Loss of estrogen causes changes in the vagina. Its lining may  become thin and dry. These changes can cause pain and bleeding during sexual intercourse. Dryness can also lead to infections. This can cause burning and itching. (Vaginal estrogen treatment can help relieve pain, itching, and dryness.)  Urinary Tract Infections are more common after menopause because of lack of estrogen. Some women also develop urinary incontinence because of low estrogen levels in the vagina and bladder.  Possible other benefits of estrogen include a positive effect on mood and short-term memory in women. RISKS AND COMPLICATIONS  Using estrogen alone without progesterone causes the lining of the uterus to grow. This increases the risk of lining of the uterus (endometrial) cancer. Your caregiver should give another hormone called progestin if you have a uterus.  Women who take combined (estrogen and progestin) HT appear to have an increased risk of breast cancer. The risk appears to be small, but increases throughout the time that HT is taken.  Combined therapy also makes the breast tissue slightly denser which makes it harder to read mammograms (breast X-rays).  Combined, estrogen and progesterone therapy can be taken together every day, in which case there may be spotting of blood. HT therapy can be taken cyclically in which case you will have menstrual periods. Cyclically means HT is taken for a set amount of days, then not taken, then this process is repeated.  HT may increase the risk of stroke, heart attack, breast cancer and forming blood clots in your leg.  Transdermal estrogen (estrogen that is absorbed through the skin with a patch or a cream) may have more positive results with:    Cholesterol.  Blood pressure.  Blood clots. Having the following conditions may indicate you should not have HT:  Endometrial cancer.  Liver disease.  Breast cancer.  Heart disease.  History of blood clots.  Stroke. TREATMENT   If you choose to take HT and have a uterus,  usually estrogen and progestin are prescribed.  Your caregiver will help you decide the best way to take the medications.  Possible ways to take estrogen include:  Pills.  Patches.  Gels.  Sprays.  Vaginal estrogen cream, rings and tablets.  It is best to take the lowest dose possible that will help your symptoms and take them for the shortest period of time that you can.  Hormone therapy can help relieve some of the problems (symptoms) that affect women at menopause. Before making a decision about HT, talk to your caregiver about what is best for you. Be well informed and comfortable with your decisions. HOME CARE INSTRUCTIONS   Follow your caregivers advice when taking the medications.  A Pap test is done to screen for cervical cancer.  The first Pap test should be done at age 21.  Between ages 21 and 29, Pap tests are repeated every 2 years.  Beginning at age 30, you are advised to have a Pap test every 3 years as long as your past 3 Pap tests have been normal.  Some women have medical problems that increase the chance of getting cervical cancer. Talk to your caregiver about these problems. It is especially important to talk to your caregiver if a new problem develops soon after your last Pap test. In these cases, your caregiver may recommend more frequent screening and Pap tests.  The above recommendations are the same for women who have or have not gotten the vaccine for HPV (Human Papillomavirus).  If you had a hysterectomy for a problem that was not a cancer or a condition that could lead to cancer, then you no longer need Pap tests. However, even if you no longer need a Pap test, a regular exam is a good idea to make sure no other problems are starting.   If you are between ages 65 and 70, and you have had normal Pap tests going back 10 years, you no longer need Pap tests. However, even if you no longer need a Pap test, a regular exam is a good idea to make sure no  other problems are starting.   If you have had past treatment for cervical cancer or a condition that could lead to cancer, you need Pap tests and screening for cancer for at least 20 years after your treatment.  If Pap tests have been discontinued, risk factors (such as a new sexual partner) need to be re-assessed to determine if screening should be resumed.  Some women may need screenings more often if they are at high risk for cervical cancer.  Get mammograms done as per the advice of your caregiver. SEEK IMMEDIATE MEDICAL CARE IF:  You develop abnormal vaginal bleeding.  You have pain or swelling in your legs, shortness of breath, or chest pain.  You develop dizziness or headaches.  You have lumps or changes in your breasts or armpits.  You have slurred speech.  You develop weakness or numbness of your arms or legs.  You have pain, burning, or bleeding when urinating.  You develop abdominal pain. Document Released: 04/07/2003 Document Revised: 10/01/2011 Document Reviewed: 07/26/2010 ExitCare Patient Information 2015 [employer]. This information is not intended to   replace advice given to you by your health care provider. Make sure you discuss any questions you have with your health care provider.   Esterified Estrogens tablets What is this medicine? ESTERIFIED ESTROGENS (es TAIR i fyed ES troe Ashley Murrainjenz) is an estrogen. It is mostly used as hormone replacement in menopausal women. It helps to treat hot flashes and prevent osteoporosis. It is also used to treat women with low hormone levels or in those who have had their ovaries removed. This medicine may also be used in the treatment of breast or prostate cancer. This medicine may be used for other purposes; ask your health care provider or pharmacist if you have questions. COMMON BRAND NAME(S): Menest What should I tell my health care provider before I take this medicine? They need to know if you have or ever had any of  these conditions: -abnormal vaginal bleeding -blood vessel disease or blood clots -breast, cervical, endometrial, ovarian, liver, or uterine cancer -dementia -diabetes -gallbladder disease -heart disease or recent heart attack -high blood pressure -high cholesterol -high level of calcium in the blood -hysterectomy -kidney disease -liver disease -migraine headaches -stroke -systemic lupus erythematosus (SLE) -tobacco smoker -vaginal bleeding -an unusual or allergic reaction to estrogens, other hormones, medicines, foods, dyes, or preservatives -pregnant or trying to get pregnant -breast-feeding How should I use this medicine? Take this medicine by mouth with a glass of water. Follow the directions on the prescription label. Take this medicine at the same time each day. Do not take your medicine more often than directed. Talk to your pediatrician regarding the use of this medicine in children. Special care may be needed. A patient package insert for the product will be given with each prescription and refill. Read this sheet carefully each time. The sheet may change frequently. Overdosage: If you think you have taken too much of this medicine contact a poison control center or emergency room at once. NOTE: This medicine is only for you. Do not share this medicine with others. What if I miss a dose? If you miss a dose, take it as soon as you can. If it is almost time for your next dose, take only that dose. Do not take double or extra doses. What may interact with this medicine? Do not take this medicine with any of the following medications: -aromatase inhibitors like aminoglutethimide, anastrozole, exemestane, letrozole, testolactone This medicine may also interact with the following medications: -barbiturates, such as phenobarbital -carbamazepine -clarithromycin -erythromycin -grapefruit juice -medicines used to treat fungal infections like ketoconazole and  itraconazole -rifabutin, rifampin, or rifapentine -ritonavir -St. John's Wort This list may not describe all possible interactions. Give your health care provider a list of all the medicines, herbs, non-prescription drugs, or dietary supplements you use. Also tell them if you smoke, drink alcohol, or use illegal drugs. Some items may interact with your medicine. What should I watch for while using this medicine? Visit your doctor or health care professional for regular checks on your progress. You will need a regular breast and pelvic exam and Pap smear while on this medicine. You should also discuss the need for regular mammograms with your health care professional, and follow his or her guidelines for these tests. This medicine can make your body retain fluid, making your fingers, hands, or ankles swell. Your blood pressure can go up. Contact your doctor or health care professional if you feel you are retaining fluid. If you have any reason to think you are pregnant, stop taking this medicine  right away and contact your doctor or health care professional. Smoking increases the risk of getting a blood clot or having a stroke while you are taking this medicine, especially if you are more than 61 years old. You are strongly advised not to smoke. If you wear contact lenses and notice visual changes, or if the lenses begin to feel uncomfortable, consult your eye doctor or health care professional. This medicine can increase the risk of developing a condition (endometrial hyperplasia) that may lead to cancer of the lining of the uterus. Taking progestins, another hormone drug, with this medicine lowers the risk of developing this condition. Therefore, if your uterus has not been removed (by a hysterectomy), your doctor may prescribe a progestin for you to take together with your estrogen. You should know, however, that taking estrogens with progestins may have additional health risks. You should discuss the  use of estrogens and progestins with your health care professional to determine the benefits and risks for you. If you are going to have surgery, you may need to stop taking this medicine. Consult your health care professional for advice before you schedule the surgery. What side effects may I notice from receiving this medicine? Side effects that you should report to your doctor or health care professional as soon as possible: -allergic reactions like skin rash, itching or hives, swelling of the face, lips, or tongue -breast tissue changes or discharge -changes in vision -chest pain -confusion, trouble speaking or understanding -dark urine -general ill feeling or flu-like symptoms -light-colored stools -nausea, vomiting -pain, swelling, warmth in the leg -right upper belly pain -severe headaches -shortness of breath -sudden numbness or weakness of the face, arm or leg -trouble walking, dizziness, loss of balance or coordination -unusual vaginal bleeding -yellowing of the eyes or skin Side effects that usually do not require medical attention (report to your doctor or health care professional if they continue or are bothersome): -hair loss -increased hunger or thirst -increased urination -symptoms of vaginal infection like itching, irritation or unusual discharge -unusually weak or tired This list may not describe all possible side effects. Call your doctor for medical advice about side effects. You may report side effects to FDA at 1-800-FDA-1088. Where should I keep my medicine? Keep out of the reach of children. Store at room temperature between 15 and 30 degrees C (59 and 86 degrees F). Throw away any unused medicine after the expiration date. NOTE: This sheet is a summary. It may not cover all possible information. If you have questions about this medicine, talk to your doctor, pharmacist, or health care provider.  2015, Elsevier/Gold Standard. (2008-07-07 16:44:23)

## 2014-06-02 NOTE — Progress Notes (Signed)
   CLINIC ENCOUNTER NOTE  History:  63 y.o. [id number] referred from PCP for pap smear screening. However, patient underwent LAVH/BSO for benign indications in 2004 and therefore does not require pap smears.  Denies any vaginal bleeding, abnormal discharge or other GYN concerns.  She is not sexually active.   Patient does report bothersome hot flashes and night sweats and wants to discuss management options.  She has used estrogen cream many years ago which gave her some relief. She sleeps in light clothing with the fan on but always wakes up drenched.  She feels these symptoms are debilitating and she wants intervention. Denies any mood changes/swings.  Past Medical History  Diagnosis Date  . Diabetes mellitus without complication   . GERD (gastroesophageal reflux disease)    Past Surgical History  Procedure Laterality Date  . Laparoscopic assisted vaginal hysterectomy  11/05/2002    Dr. Marcelle OverlieHolland, done for menorrhagia.  Benign pathology  . Bilateral salpingoophorectomy  11/05/2002  . Laceration repair Right 1957    ankle from lawnmower accident   The following portions of the patient's history were reviewed and updated as appropriate: allergies, current medications, past family history, past medical history, past social history, past surgical history and problem list.  Normal mammogram on 11/04/13.   Review of Systems:  Pertinent items are noted in HPI.  Objective:  BP 126/56 mmHg  Pulse 73  Ht 5\' 7"  (1.702 m)  Wt 247 lb 9.6 oz (112.311 kg)  BMI 38.77 kg/m2 Physical Exam deferred  Labs and Imaging 11/05/2002 Surgical pathology HYSTERECTOMY, UTERUS, OVARIES AND FALLOPIAN TUBES: - SEROSAL SURFACES: NO PATHOLOGIC DIAGNOSIS. - CERVIX: SQUAMOUS METAPLASIA, NO EVIDENCE OF DYSPLASIA. - ENDOMETRIUM: WEAKLY PROLIFERATIVE ENDOMETRIUM, NOEVIDENCE OF HYPERPLASIA ORMALIGNANCY. - MYOMETRIUM: SUPERFICIAL ADENOMYOSIS. - LEIOMYOMATA, MEASURING UP TO 0.6 CM. - RIGHT ADNEXA: SIMPLE PARATUBAL  CYST. - LEFT ADNEXA: SIMPLE PARATUBAL CYST.  Assessment & Plan:  Patient with bothersome menopausal vasomotor symptoms. She is already doing lifestyle interventions such as wearing light clothing, remaining in cool environments, having fan/air conditioner in the room, avoiding hot beverages etc.  Discussed using hormone therapy and concerns about about increased risk of heart disease, cerebrovascular disease, thromboembolic disease,  and breast cancer.  Also discussed other medical options such as Paxil, Effexor or Neurontin.   Also discussed alternative therapies such as herbal remedies but cautioned that most of the products contained phytoestrogens (plant estrogens) in unregulated amounts which can have the same effects on the body as the pharmaceutical estrogen preparations.   Patient opted for estrogen therapy for now, wants to try the oral formulation.  Estradiol 0.5 mg prescribed, told to increase to 1 mg if no alleviation of symptoms. She will return in 2 months for reevaluation. Routine preventative health maintenance measures emphasized.   Rhonda CollinsUGONNA  Kriya Westra, MD, FACOG Attending Obstetrician & Gynecologist Center for [facility], [facility]

## 2014-06-03 ENCOUNTER — Encounter: Payer: Self-pay | Admitting: Obstetrics & Gynecology

## 2014-06-03 DIAGNOSIS — N951 Menopausal and female climacteric states: Secondary | ICD-10-CM | POA: Insufficient documentation

## 2014-08-03 ENCOUNTER — Encounter: Payer: Self-pay | Admitting: Internal Medicine

## 2014-08-03 ENCOUNTER — Ambulatory Visit: Payer: BLUE CROSS/BLUE SHIELD | Attending: Internal Medicine | Admitting: Internal Medicine

## 2014-08-03 VITALS — BP 132/78 | HR 80 | Temp 98.0°F | Resp 16 | Wt 247.0 lb

## 2014-08-03 DIAGNOSIS — E139 Other specified diabetes mellitus without complications: Secondary | ICD-10-CM

## 2014-08-03 DIAGNOSIS — E119 Type 2 diabetes mellitus without complications: Secondary | ICD-10-CM | POA: Diagnosis not present

## 2014-08-03 DIAGNOSIS — K219 Gastro-esophageal reflux disease without esophagitis: Secondary | ICD-10-CM | POA: Diagnosis not present

## 2014-08-03 LAB — POCT GLYCOSYLATED HEMOGLOBIN (HGB A1C): Hemoglobin A1C: 6.4

## 2014-08-03 LAB — GLUCOSE, POCT (MANUAL RESULT ENTRY): POC Glucose: 101 mg/dl — AB (ref 70–99)

## 2014-08-03 MED ORDER — METFORMIN HCL 850 MG PO TABS: 850.0000 mg | ORAL_TABLET | Freq: Two times a day (BID) | ORAL | Status: DC

## 2014-08-03 NOTE — Progress Notes (Signed)
Patient here for follow up on her diabetes Patient states just refilled her medications

## 2014-08-03 NOTE — Progress Notes (Signed)
MRN: [id number] Name: Rhonda Miles  Sex: female Age: 64 y.o. DOB: August 17, 1950  Allergies: Diona Fanti  Chief Complaint  Patient presents with  . Follow-up    HPI: Patient is 64 y.o. female who history of diabetes comes today for followup, as per patient she is compliant with her medication and is taking metformin 850 mg twice a day, she denies any hypoglycemic symptoms, today her hemoglobin A1c has improved from 7.2% to 6.4%, patient has already followed up her with the gynecologist and had been started on estradiol for postmenopausal vasomotor symptoms.  Past Medical History  Diagnosis Date  . Diabetes mellitus without complication   . GERD (gastroesophageal reflux disease)     Past Surgical History  Procedure Laterality Date  . Laparoscopic assisted vaginal hysterectomy  11/05/2002    Dr. Matthew Saras, done for menorrhagia.  Benign pathology  . Bilateral salpingoophorectomy  11/05/2002  . Laceration repair Right 1957    ankle from lawnmower accident      Medication List       This list is accurate as of: 08/03/14 11:49 AM.  Always use your most recent med list.               blood glucose meter kit and supplies  Use as instructed     estradiol 1 MG tablet  Commonly known as:  ESTRACE  Take 0.5 tablet daily.  If no response after 4 days, can increase to 1 tablet daily     glucose blood test strip  Use as instructed     metFORMIN 850 MG tablet  Commonly known as:  GLUCOPHAGE  Take 1 tablet (850 mg total) by mouth 2 (two) times daily with a meal.        No orders of the defined types were placed in this encounter.    Immunization History  Administered Date(s) Administered  . Influenza,inj,Quad PF,36+ Mos 04/27/2014  . Pneumococcal Polysaccharide-23 04/27/2014    Family History  Problem Relation Age of Onset  . Diabetes Father   . Hypertension Mother   . Colon cancer Maternal Grandmother 60    History  Substance Use Topics  . Smoking status: Former  Smoker    Quit date: 10/22/1983  . Smokeless tobacco: Never Used  . Alcohol Use: No    Review of Systems   As noted in HPI  Filed Vitals:   08/03/14 1103  BP: 132/78  Pulse: 80  Temp: 98 F (36.7 C)  Resp: 16    Physical Exam  Physical Exam  Constitutional: No distress.  Eyes: EOM are normal. Pupils are equal, round, and reactive to light.  Cardiovascular: Normal rate and regular rhythm.   Pulmonary/Chest: Breath sounds normal. No respiratory distress. She has no wheezes. She has no rales.    CBC    Component Value Date/Time   WBC 5.8 10/26/2013 1154   RBC 4.94 10/26/2013 1154   HGB 12.9 10/26/2013 1154   HCT 39.0 10/26/2013 1154   PLT 287 10/26/2013 1154   MCV 78.9 10/26/2013 1154   LYMPHSABS 2.4 10/26/2013 1154   MONOABS 0.3 10/26/2013 1154   EOSABS 0.1 10/26/2013 1154   BASOSABS 0.0 10/26/2013 1154    CMP     Component Value Date/Time   NA 138 10/26/2013 1154   K 4.4 10/26/2013 1154   CL 101 10/26/2013 1154   CO2 27 10/26/2013 1154   GLUCOSE 98 10/26/2013 1154   BUN 16 10/26/2013 1154   CREATININE 0.59 10/26/2013 1154  CREATININE 0.54 02/14/2013 0015   CALCIUM 9.3 10/26/2013 1154   PROT 7.1 10/26/2013 1154   ALBUMIN 3.9 10/26/2013 1154   AST 14 10/26/2013 1154   ALT 17 10/26/2013 1154   ALKPHOS 65 10/26/2013 1154   BILITOT 0.8 10/26/2013 1154   GFRNONAA >89 10/26/2013 1154   GFRNONAA >90 02/14/2013 0015   GFRAA >89 10/26/2013 1154   GFRAA >90 02/14/2013 0015    Lab Results  Component Value Date/Time   CHOL 138 10/26/2013 11:54 AM    No components found for: HGA1C  Lab Results  Component Value Date/Time   AST 14 10/26/2013 11:54 AM    Assessment and Plan  Other specified diabetes mellitus without complications - Plan: Results for orders placed or performed in visit on 08/03/14  Glucose (CBG)  Result Value Ref Range   POC Glucose 101.0 (A) 70 - 99 mg/dl  HgB A1c  Result Value Ref Range   Hemoglobin A1C 6.40    HgB A1c has  trended down, continue diabetes meal planning, continue with metformin, will repeat A1c on following visit also do blood work and check fasting lipid panel on the next visit. , metFORMIN (GLUCOPHAGE) 850 MG tablet   Health Maintenance -Colonoscopy: uptodate  -Pap Smear: uptodate  -Mammogram: uptodate  -Vaccinations:  uptodate with flu shot  And pneumovax   Return in about 4 months (around 12/02/2014) for diabetes.  Lorayne Marek, MD

## 2014-08-03 NOTE — Patient Instructions (Signed)
Diabetes Mellitus and Food It is important for you to manage your blood sugar (glucose) level. Your blood glucose level can be greatly affected by what you eat. Eating healthier foods in the appropriate amounts throughout the day at about the same time each day will help you control your blood glucose level. It can also help slow or prevent worsening of your diabetes mellitus. Healthy eating may even help you improve the level of your blood pressure and reach or maintain a healthy weight.  HOW CAN FOOD AFFECT ME? Carbohydrates Carbohydrates affect your blood glucose level more than any other type of food. Your dietitian will help you determine how many carbohydrates to eat at each meal and teach you how to count carbohydrates. Counting carbohydrates is important to keep your blood glucose at a healthy level, especially if you are using insulin or taking certain medicines for diabetes mellitus. Alcohol Alcohol can cause sudden decreases in blood glucose (hypoglycemia), especially if you use insulin or take certain medicines for diabetes mellitus. Hypoglycemia can be a life-threatening condition. Symptoms of hypoglycemia (sleepiness, dizziness, and disorientation) are similar to symptoms of having too much alcohol.  If your health care provider has given you approval to drink alcohol, do so in moderation and use the following guidelines:  Women should not have more than one drink per day, and men should not have more than two drinks per day. One drink is equal to:  12 oz of beer.  5 oz of wine.  1 oz of hard liquor.  Do not drink on an empty stomach.  Keep yourself hydrated. Have water, diet soda, or unsweetened iced tea.  Regular soda, juice, and other mixers might contain a lot of carbohydrates and should be counted. WHAT FOODS ARE NOT RECOMMENDED? As you make food choices, it is important to remember that all foods are not the same. Some foods have fewer nutrients per serving than other  foods, even though they might have the same number of calories or carbohydrates. It is difficult to get your body what it needs when you eat foods with fewer nutrients. Examples of foods that you should avoid that are high in calories and carbohydrates but low in nutrients include:  Trans fats (most processed foods list trans fats on the Nutrition Facts label).  Regular soda.  Juice.  Candy.  Sweets, such as cake, pie, doughnuts, and cookies.  Fried foods. WHAT FOODS CAN I EAT? Have nutrient-rich foods, which will nourish your body and keep you healthy. The food you should eat also will depend on several factors, including:  The calories you need.  The medicines you take.  Your weight.  Your blood glucose level.  Your blood pressure level.  Your cholesterol level. You also should eat a variety of foods, including:  Protein, such as meat, poultry, fish, tofu, nuts, and seeds (lean animal proteins are best).  Fruits.  Vegetables.  Dairy products, such as milk, cheese, and yogurt (low fat is best).  Breads, grains, pasta, cereal, rice, and beans.  Fats such as olive oil, trans fat-free margarine, canola oil, avocado, and olives. DOES EVERYONE WITH DIABETES MELLITUS HAVE THE SAME MEAL PLAN? Because every person with diabetes mellitus is different, there is not one meal plan that works for everyone. It is very important that you meet with a dietitian who will help you create a meal plan that is just right for you. Document Released: 04/05/2005 Document Revised: 07/14/2013 Document Reviewed: 06/05/2013 [employer] Patient Information 2015 [employer]. This   information is not intended to replace advice given to you by your health care provider. Make sure you discuss any questions you have with your health care provider.  

## 2014-12-03 ENCOUNTER — Ambulatory Visit: Payer: BLUE CROSS/BLUE SHIELD | Admitting: Internal Medicine

## 2014-12-08 ENCOUNTER — Encounter: Payer: Self-pay | Admitting: Internal Medicine

## 2014-12-08 ENCOUNTER — Ambulatory Visit: Payer: BLUE CROSS/BLUE SHIELD | Attending: Internal Medicine | Admitting: Internal Medicine

## 2014-12-08 VITALS — BP 106/72 | HR 70 | Temp 98.0°F | Resp 16 | Wt 243.2 lb

## 2014-12-08 DIAGNOSIS — Z1231 Encounter for screening mammogram for malignant neoplasm of breast: Secondary | ICD-10-CM | POA: Insufficient documentation

## 2014-12-08 DIAGNOSIS — E139 Other specified diabetes mellitus without complications: Secondary | ICD-10-CM | POA: Diagnosis not present

## 2014-12-08 LAB — COMPLETE METABOLIC PANEL WITH GFR
ALBUMIN: 4.2 g/dL (ref 3.5–5.2)
ALK PHOS: 67 U/L (ref 39–117)
ALT: 16 U/L (ref 0–35)
AST: 13 U/L (ref 0–37)
BILIRUBIN TOTAL: 0.6 mg/dL (ref 0.2–1.2)
BUN: 14 mg/dL (ref 6–23)
CO2: 28 mEq/L (ref 19–32)
Calcium: 9.6 mg/dL (ref 8.4–10.5)
Chloride: 102 mEq/L (ref 96–112)
Creat: 0.68 mg/dL (ref 0.50–1.10)
GFR, Est African American: >89 mL/min
GLUCOSE: 117 mg/dL — AB (ref 70–99)
POTASSIUM: 4.9 meq/L (ref 3.5–5.3)
SODIUM: 139 meq/L (ref 135–145)
Total Protein: 7.8 g/dL (ref 6.0–8.3)

## 2014-12-08 LAB — POCT GLYCOSYLATED HEMOGLOBIN (HGB A1C): Hemoglobin A1C: 6.6

## 2014-12-08 LAB — LIPID PANEL
Cholesterol: 142 mg/dL (ref 0–200)
HDL: 46 mg/dL (ref >=46)
LDL CALC: 82 mg/dL (ref 0–99)
TRIGLYCERIDES: 69 mg/dL (ref <150)
Total CHOL/HDL Ratio: 3.1 Ratio
VLDL: 14 mg/dL (ref 0–40)

## 2014-12-08 LAB — GLUCOSE, POCT (MANUAL RESULT ENTRY): POC Glucose: 123 mg/dl — AB (ref 70–99)

## 2014-12-08 MED ORDER — METFORMIN HCL 850 MG PO TABS: 850.0000 mg | ORAL_TABLET | Freq: Two times a day (BID) | ORAL | Status: DC

## 2014-12-08 NOTE — Patient Instructions (Signed)
Diabetes Mellitus and Food It is important for you to manage your blood sugar (glucose) level. Your blood glucose level can be greatly affected by what you eat. Eating healthier foods in the appropriate amounts throughout the day at about the same time each day will help you control your blood glucose level. It can also help slow or prevent worsening of your diabetes mellitus. Healthy eating may even help you improve the level of your blood pressure and reach or maintain a healthy weight.  HOW CAN FOOD AFFECT ME? Carbohydrates Carbohydrates affect your blood glucose level more than any other type of food. Your dietitian will help you determine how many carbohydrates to eat at each meal and teach you how to count carbohydrates. Counting carbohydrates is important to keep your blood glucose at a healthy level, especially if you are using insulin or taking certain medicines for diabetes mellitus. Alcohol Alcohol can cause sudden decreases in blood glucose (hypoglycemia), especially if you use insulin or take certain medicines for diabetes mellitus. Hypoglycemia can be a life-threatening condition. Symptoms of hypoglycemia (sleepiness, dizziness, and disorientation) are similar to symptoms of having too much alcohol.  If your health care provider has given you approval to drink alcohol, do so in moderation and use the following guidelines:  Women should not have more than one drink per day, and men should not have more than two drinks per day. One drink is equal to:  12 oz of beer.  5 oz of wine.  1 oz of hard liquor.  Do not drink on an empty stomach.  Keep yourself hydrated. Have water, diet soda, or unsweetened iced tea.  Regular soda, juice, and other mixers might contain a lot of carbohydrates and should be counted. WHAT FOODS ARE NOT RECOMMENDED? As you make food choices, it is important to remember that all foods are not the same. Some foods have fewer nutrients per serving than other  foods, even though they might have the same number of calories or carbohydrates. It is difficult to get your body what it needs when you eat foods with fewer nutrients. Examples of foods that you should avoid that are high in calories and carbohydrates but low in nutrients include:  Trans fats (most processed foods list trans fats on the Nutrition Facts label).  Regular soda.  Juice.  Candy.  Sweets, such as cake, pie, doughnuts, and cookies.  Fried foods. WHAT FOODS CAN I EAT? Have nutrient-rich foods, which will nourish your body and keep you healthy. The food you should eat also will depend on several factors, including:  The calories you need.  The medicines you take.  Your weight.  Your blood glucose level.  Your blood pressure level.  Your cholesterol level. You also should eat a variety of foods, including:  Protein, such as meat, poultry, fish, tofu, nuts, and seeds (lean animal proteins are best).  Fruits.  Vegetables.  Dairy products, such as milk, cheese, and yogurt (low fat is best).  Breads, grains, pasta, cereal, rice, and beans.  Fats such as olive oil, trans fat-free margarine, canola oil, avocado, and olives. DOES EVERYONE WITH DIABETES MELLITUS HAVE THE SAME MEAL PLAN? Because every person with diabetes mellitus is different, there is not one meal plan that works for everyone. It is very important that you meet with a dietitian who will help you create a meal plan that is just right for you. Document Released: 04/05/2005 Document Revised: 07/14/2013 Document Reviewed: 06/05/2013 [employer] Patient Information 2015 [employer]. This   information is not intended to replace advice given to you by your health care provider. Make sure you discuss any questions you have with your health care provider.  

## 2014-12-08 NOTE — Progress Notes (Signed)
MRN: [id number] Name: Rhonda Miles  Sex: female Age: 64 y.o. DOB: 19-Oct-1950  Allergies: Diona Fanti  Chief Complaint  Patient presents with  . Follow-up    HPI: Patient is 64 y.o. female who history of diabetes comes today for followup, currently patient is on metformin she is requesting refill on her medication, her hemoglobin A1c slightly trended up compared to last visit , she denies any hypoglycemic symptoms, her fasting blood sugar is usually between 90-135 mg/dL, she is up-to-date with her colonoscopy and is now due for mammogram.patient also follows up with an ophthalmologist and is going to schedule appointment.  Past Medical History  Diagnosis Date  . Diabetes mellitus without complication   . GERD (gastroesophageal reflux disease)     Past Surgical History  Procedure Laterality Date  . Laparoscopic assisted vaginal hysterectomy  11/05/2002    Dr. Matthew Saras, done for menorrhagia.  Benign pathology  . Bilateral salpingoophorectomy  11/05/2002  . Laceration repair Right 1957    ankle from lawnmower accident      Medication List       This list is accurate as of: 12/08/14 10:15 AM.  Always use your most recent med list.               blood glucose meter kit and supplies  Use as instructed     estradiol 1 MG tablet  Commonly known as:  ESTRACE  Take 0.5 tablet daily.  If no response after 4 days, can increase to 1 tablet daily     glucose blood test strip  Use as instructed     metFORMIN 850 MG tablet  Commonly known as:  GLUCOPHAGE  Take 1 tablet (850 mg total) by mouth 2 (two) times daily with a meal.        Meds ordered this encounter  Medications  . metFORMIN (GLUCOPHAGE) 850 MG tablet    Sig: Take 1 tablet (850 mg total) by mouth 2 (two) times daily with a meal.    Dispense:  60 tablet    Refill:  3    Immunization History  Administered Date(s) Administered  . Influenza,inj,Quad PF,36+ Mos 04/27/2014  . Pneumococcal Polysaccharide-23 04/27/2014     Family History  Problem Relation Age of Onset  . Diabetes Father   . Hypertension Mother   . Colon cancer Maternal Grandmother 60    History  Substance Use Topics  . Smoking status: Former Smoker    Quit date: 10/22/1983  . Smokeless tobacco: Never Used  . Alcohol Use: No    Review of Systems   As noted in HPI  Filed Vitals:   12/08/14 0947  BP: 106/72  Pulse: 70  Temp: 98 F (36.7 C)  Resp: 16    Physical Exam  Physical Exam  Constitutional: No distress.  Eyes: EOM are normal. Pupils are equal, round, and reactive to light.  Cardiovascular: Normal rate and regular rhythm.   Pulmonary/Chest: Breath sounds normal. No respiratory distress. She has no wheezes. She has no rales.    CBC    Component Value Date/Time   WBC 5.8 10/26/2013 1154   RBC 4.94 10/26/2013 1154   HGB 12.9 10/26/2013 1154   HCT 39.0 10/26/2013 1154   PLT 287 10/26/2013 1154   MCV 78.9 10/26/2013 1154   LYMPHSABS 2.4 10/26/2013 1154   MONOABS 0.3 10/26/2013 1154   EOSABS 0.1 10/26/2013 1154   BASOSABS 0.0 10/26/2013 1154    CMP     Component Value Date/Time  NA 138 10/26/2013 1154   K 4.4 10/26/2013 1154   CL 101 10/26/2013 1154   CO2 27 10/26/2013 1154   GLUCOSE 98 10/26/2013 1154   BUN 16 10/26/2013 1154   CREATININE 0.59 10/26/2013 1154   CREATININE 0.54 02/14/2013 0015   CALCIUM 9.3 10/26/2013 1154   PROT 7.1 10/26/2013 1154   ALBUMIN 3.9 10/26/2013 1154   AST 14 10/26/2013 1154   ALT 17 10/26/2013 1154   ALKPHOS 65 10/26/2013 1154   BILITOT 0.8 10/26/2013 1154   GFRNONAA >89 10/26/2013 1154   GFRNONAA >90 02/14/2013 0015   GFRAA >89 10/26/2013 1154   GFRAA >90 02/14/2013 0015    Lab Results  Component Value Date/Time   CHOL 138 10/26/2013 11:54 AM    Lab Results  Component Value Date/Time   HGBA1C 6.60 12/08/2014 09:47 AM   HGBA1C 13.1* 02/13/2013 06:53 PM    Lab Results  Component Value Date/Time   AST 14 10/26/2013 11:54 AM    Assessment and  Plan  Other specified diabetes mellitus without complications - Plan:  Results for orders placed or performed in visit on 12/08/14  Glucose (CBG)  Result Value Ref Range   POC Glucose 123.0 (A) 70 - 99 mg/dl  HgB A1c  Result Value Ref Range   Hemoglobin A1C 6.60    Diabetes is fairly well controlled, continue with current meds, repeat her, HgB A1c in 3 months, metFORMIN (GLUCOPHAGE) 850 MG tablet, COMPLETE METABOLIC PANEL WITH GFR, Lipid panel  Encounter for screening mammogram for breast cancer - Plan: MM [facility] Maintenance -Colonoscopy:up-to-date  -Mammogram:ordered  Return in about 3 months (around 03/10/2015) for diabetes.   This note has been created with Surveyor, quantity. Any transcriptional errors are unintentional.    Lorayne Marek, MD

## 2014-12-08 NOTE — Progress Notes (Signed)
Patient here for follow up on her diabetes And medication refill

## 2014-12-09 ENCOUNTER — Telehealth: Payer: Self-pay

## 2014-12-09 NOTE — Telephone Encounter (Signed)
-----   Message from Doris Cheadleeepak Advani, MD sent at 12/09/2014  9:23 AM EDT ----- Call and let the patient know that her cholesterol level is in normal range, her blood chemistries normal except for borderline elevated sugar level.

## 2014-12-09 NOTE — Telephone Encounter (Signed)
Patient is aware of her lab results 

## 2015-01-04 ENCOUNTER — Ambulatory Visit (HOSPITAL_COMMUNITY)
Admission: RE | Admit: 2015-01-04 | Discharge: 2015-01-04 | Disposition: A | Discharge status: Home / Self Care | Payer: BLUE CROSS/BLUE SHIELD | Source: Ambulatory Visit | Attending: Internal Medicine | Admitting: Internal Medicine

## 2015-01-04 DIAGNOSIS — Z1231 Encounter for screening mammogram for malignant neoplasm of breast: Secondary | ICD-10-CM | POA: Insufficient documentation

## 2015-03-29 ENCOUNTER — Ambulatory Visit: Payer: BLUE CROSS/BLUE SHIELD | Attending: Family Medicine | Admitting: Family Medicine

## 2015-03-29 ENCOUNTER — Encounter: Payer: Self-pay | Admitting: Family Medicine

## 2015-03-29 VITALS — BP 104/69 | HR 95 | Temp 98.7°F | Resp 16 | Ht 67.0 in | Wt 241.0 lb

## 2015-03-29 DIAGNOSIS — Z1159 Encounter for screening for other viral diseases: Secondary | ICD-10-CM | POA: Insufficient documentation

## 2015-03-29 DIAGNOSIS — Z114 Encounter for screening for human immunodeficiency virus [HIV]: Secondary | ICD-10-CM | POA: Diagnosis not present

## 2015-03-29 DIAGNOSIS — Z0000 Encounter for general adult medical examination without abnormal findings: Secondary | ICD-10-CM | POA: Insufficient documentation

## 2015-03-29 DIAGNOSIS — Z87891 Personal history of nicotine dependence: Secondary | ICD-10-CM | POA: Diagnosis not present

## 2015-03-29 DIAGNOSIS — E139 Other specified diabetes mellitus without complications: Secondary | ICD-10-CM

## 2015-03-29 DIAGNOSIS — Z23 Encounter for immunization: Secondary | ICD-10-CM

## 2015-03-29 DIAGNOSIS — E089 Diabetes mellitus due to underlying condition without complications: Secondary | ICD-10-CM | POA: Diagnosis not present

## 2015-03-29 LAB — POCT GLYCOSYLATED HEMOGLOBIN (HGB A1C): HEMOGLOBIN A1C: 7

## 2015-03-29 LAB — GLUCOSE, POCT (MANUAL RESULT ENTRY): POC Glucose: 105 mg/dl — AB (ref 70–99)

## 2015-03-29 MED ORDER — ZOSTER VACCINE LIVE 19400 UNT/0.65ML ~~LOC~~ SOLR: 0.6500 mL | Freq: Once | SUBCUTANEOUS | Status: DC

## 2015-03-29 MED ORDER — METFORMIN HCL 850 MG PO TABS: 850.0000 mg | ORAL_TABLET | Freq: Two times a day (BID) | ORAL | Status: DC

## 2015-03-29 NOTE — Progress Notes (Signed)
Subjective:    Patient ID: Rhonda Miles, female    DOB: 09-30-1950, 64 y.o.   MRN: [id number] CC: DM2 f.u   HPI  1. CHRONIC DIABETES  Disease Monitoring  Blood Sugar Ranges: 130-88  Polyuria: no   Visual problems: no   Medication Compliance: yes  Medication Side Effects  Hypoglycemia: no    Current Outpatient Prescriptions on File Prior to Visit  Medication Sig Dispense Refill  . Blood Glucose Monitoring Suppl (BLOOD GLUCOSE METER) kit Use as instructed 1 each 0  . glucose blood test strip Use as instructed 100 each 12  . metFORMIN (GLUCOPHAGE) 850 MG tablet Take 1 tablet (850 mg total) by mouth 2 (two) times daily with a meal. 60 tablet 3  . estradiol (ESTRACE) 1 MG tablet Take 0.5 tablet daily.  If no response after 4 days, can increase to 1 tablet daily (Patient not taking: Reported on 03/29/2015) 30 tablet 1   No current facility-administered medications on file prior to visit.   Social History  Substance Use Topics  . Smoking status: Former Smoker    Quit date: 10/22/1983  . Smokeless tobacco: Never Used  . Alcohol Use: No   Review of Systems  Constitutional: Negative for fever and chills.  Eyes: Negative for visual disturbance.  Respiratory: Negative for shortness of breath.   Cardiovascular: Negative for chest pain.  Gastrointestinal: Negative for abdominal pain and blood in stool.  Musculoskeletal: Negative for back pain and arthralgias.  Skin: Negative for rash.  Allergic/Immunologic: Negative for immunocompromised state.  Hematological: Negative for adenopathy. Does not bruise/bleed easily.  Psychiatric/Behavioral: Negative for suicidal ideas and dysphoric mood.      Objective:   Physical Exam  Constitutional: She is oriented to person, place, and time. She appears well-developed and well-nourished. No distress.  HENT:  Head: Normocephalic and atraumatic.  Cardiovascular: Normal rate, regular rhythm, normal heart sounds and intact distal pulses.     Pulmonary/Chest: Effort normal and breath sounds normal.  Musculoskeletal: She exhibits no edema.  Neurological: She is alert and oriented to person, place, and time.  Skin: Skin is warm and dry. No rash noted.  Psychiatric: She has a normal mood and affect.  BP 104/69 mmHg  Pulse 95  Temp(Src) 98.7 F (37.1 C) (Oral)  Resp 16  Ht '5\' 7"'  (1.702 m)  Wt 241 lb (109.317 kg)  BMI 37.74 kg/m2  SpO2 97%  BP Readings from Last 3 Encounters:  03/29/15 104/69  12/08/14 106/72  08/03/14 132/78   Wt Readings from Last 3 Encounters:  03/29/15 241 lb (109.317 kg)  12/08/14 243 lb 3.2 oz (110.315 kg)  08/03/14 247 lb (112.038 kg)    Lab Results  Component Value Date   HGBA1C 7.0 03/29/2015   CBG 105       Assessment & Plan:  Rhonda Miles was seen today for follow-up and establish care.  Diagnoses and all orders for this visit:  Healthcare maintenance -     zoster vaccine live, PF, (ZOSTAVAX) 10258 UNT/0.65ML injection; Inject 19,400 Units into the skin once.  Diabetes mellitus due to underlying condition without complications -     POCT glucose (manual entry) -     POCT glycosylated hemoglobin (Hb A1C) -     Microalbumin, urine  Screening for HIV (human immunodeficiency virus) -     Cancel: HIV antibody (with reflex)  Need for hepatitis C screening test -     Cancel: Hepatitis C antibody, reflex  Other specified diabetes mellitus  without complications -     metFORMIN (GLUCOPHAGE) 850 MG tablet; Take 1 tablet (850 mg total) by mouth 2 (two) times daily with a meal. -     Ambulatory referral to Ophthalmology  Other orders -     Tdap vaccine greater than or equal to 7yo IM

## 2015-03-29 NOTE — Patient Instructions (Signed)
Rhonda Miles,  Thank you for coming in today  1. Diabetes: Well controlled Continue metformin at current dose Check out the ketogenic diet to help with weight loss efforts http://www.ruled.me/ketogenic-diet-food-list/  F/u for flu shot F/u with me in 3-4 months for diabetes   Dr. Armen Pickup

## 2015-03-29 NOTE — Progress Notes (Signed)
F/U DM  Establish Care with new PCP Glucose running 88 -130 No hx tobacco

## 2015-03-30 LAB — MICROALBUMIN, URINE: Microalb, Ur: 0.4 mg/dL (ref <2.0)

## 2015-03-31 ENCOUNTER — Telehealth: Payer: Self-pay | Admitting: *Deleted

## 2015-03-31 NOTE — Telephone Encounter (Signed)
-----   Message from Dessa Phi, MD sent at 03/30/2015  8:59 AM EDT ----- Normal urine microalbumin

## 2015-03-31 NOTE — Telephone Encounter (Signed)
Date of birth verified by pt Microalbumin results given to pt Pt verbalized understanding

## 2015-05-10 ENCOUNTER — Ambulatory Visit: Payer: BLUE CROSS/BLUE SHIELD | Attending: Family Medicine | Admitting: Pharmacist

## 2015-05-10 DIAGNOSIS — Z23 Encounter for immunization: Secondary | ICD-10-CM | POA: Insufficient documentation

## 2015-05-10 MED ORDER — INFLUENZA VAC SPLIT QUAD 0.5 ML IM SUSY
0.5000 mL | PREFILLED_SYRINGE | Freq: Once | INTRAMUSCULAR | Status: AC
  Administered 2015-05-10: 0.5 mL via INTRAMUSCULAR

## 2015-05-10 NOTE — Patient Instructions (Signed)

## 2015-05-24 ENCOUNTER — Encounter (HOSPITAL_COMMUNITY): Payer: Self-pay | Admitting: Emergency Medicine

## 2015-05-24 ENCOUNTER — Emergency Department (HOSPITAL_COMMUNITY)
Admission: EM | Admit: 2015-05-24 | Discharge: 2015-05-24 | Disposition: A | Discharge status: Home / Self Care | Payer: BLUE CROSS/BLUE SHIELD | Source: Home / Self Care | Attending: Emergency Medicine | Admitting: Emergency Medicine

## 2015-05-24 ENCOUNTER — Telehealth: Payer: Self-pay | Admitting: Family Medicine

## 2015-05-24 DIAGNOSIS — R21 Rash and other nonspecific skin eruption: Secondary | ICD-10-CM | POA: Diagnosis not present

## 2015-05-24 MED ORDER — PERMETHRIN 5 % EX CREA: TOPICAL_CREAM | CUTANEOUS | Status: DC

## 2015-05-24 MED ORDER — TRIAMCINOLONE ACETONIDE 0.1 % EX CREA: 1.0000 "application " | TOPICAL_CREAM | Freq: Two times a day (BID) | CUTANEOUS | Status: DC

## 2015-05-24 NOTE — ED Provider Notes (Signed)
HPI  SUBJECTIVE:  Rhonda Miles is a 64 y.o. female who presents with an intensely itchy rash starting on her arms, spreading to her leg, torso, back, neck for 4 days. Patient states it is itchy all day. States that it started after sleeping in the same bed with a grandson who has a similar rash. She also started Toradol eyedrops several days prior to the rash starting. She denies nausea, vomiting, lip swelling, tongue swelling difficulty breathing, wheezing, shortness of breath. She denies other new lotions, soaps, detergents, other new medications. She takes metformin only. No URI-like symptoms, bodyaches, fevers, crusting, paresthesias prior to the rash showing up. No sensation being bitten at night, no blood in the bedclothes in the morning. Past medical history of diabetes. No history of hypertension, allergies, atopy.     Past Medical History  Diagnosis Date  . Diabetes mellitus without complication ([place]) Dx 3557  . GERD (gastroesophageal reflux disease) Dx 2014    Past Surgical History  Procedure Laterality Date  . Laparoscopic assisted vaginal hysterectomy  11/05/2002    Dr. Matthew Saras, done for menorrhagia.  Benign pathology  . Bilateral salpingoophorectomy  11/05/2002  . Laceration repair Right 1957    ankle from lawnmower accident    Family History  Problem Relation Age of Onset  . Diabetes Father   . Hypertension Mother   . Colon cancer Maternal Grandmother 39    Social History  Substance Use Topics  . Smoking status: Former Smoker    Quit date: 10/22/1983  . Smokeless tobacco: Never Used  . Alcohol Use: No    No current facility-administered medications for this encounter.  Current outpatient prescriptions:  .  cholecalciferol (VITAMIN D) 1000 UNITS tablet, Take 1,000 Units by mouth daily., Disp: , Rfl:  .  latanoprost (XALATAN) 0.005 % ophthalmic solution, 1 drop at bedtime., Disp: , Rfl:  .  metFORMIN (GLUCOPHAGE) 850 MG tablet, Take 1 tablet (850 mg total) by  mouth 2 (two) times daily with a meal., Disp: 60 tablet, Rfl: 5 .  Blood Glucose Monitoring Suppl (BLOOD GLUCOSE METER) kit, Use as instructed, Disp: 1 each, Rfl: 0 .  glucose blood test strip, Use as instructed, Disp: 100 each, Rfl: 12 .  permethrin (ELIMITE) 5 % cream, Apply from chin down, leave on for 8-14 hours, rinse. Repeat in 1 week, Disp: 60 g, Rfl: 0 .  triamcinolone cream (KENALOG) 0.1 %, Apply 1 application topically 2 (two) times daily. Apply for 2 weeks. May use on face, Disp: 30 g, Rfl: 0 .  zoster vaccine live, PF, (ZOSTAVAX) 32202 UNT/0.65ML injection, Inject 19,400 Units into the skin once., Disp: 1 each, Rfl: 0  Allergies  Allergen Reactions  . Asa [Aspirin] Other (See Comments)    shakey     ROS  As noted in HPI.   Physical Exam  BP 118/60 mmHg  Pulse 82  Temp(Src) 97.8 F (36.6 C) (Oral)  Resp 16  SpO2 99%  Constitutional: Well developed, well nourished, no acute distress Eyes:  EOMI, conjunctiva normal bilaterally HENT: Normocephalic, atraumatic,mucus membranes moist. No angioedema voice normal. Respiratory: Normal inspiratory effort Cardiovascular: Normal rate GI: nondistended skin: Diffuse erythematous papular rash over arms, legs, and between fingers, on back suggestive of scabies. Musculoskeletal: no deformities Neurologic: Alert & oriented x 3, no focal neuro deficits Psychiatric: Speech and behavior appropriate   ED Course   Medications - No data to display  No orders of the defined types were placed in this encounter.  No results found for this or any previous visit (from the past 24 hour(s)). No results found.  ED Clinical Impression  Rash   ED Assessment/Plan  No evidence of allergic reaction to medications. Presentation suggestive of scabies. Claritin, Zyrtec, Benadryl at night. Steroid cream, permethrin wash all clothes in hot water felt primary care physician as needed or return here if gets worse.  Discussed  MDM, plan  and followup with patient. Patient agrees with plan.  *This clinic note was created using Dragon dictation software. Therefore, there may be occasional mistakes despite careful proofreading.  ?   Melynda Ripple, MD 05/24/15 1440

## 2015-05-24 NOTE — Discharge Instructions (Signed)
Scabies is usually transmitted through close physical contact, but may be transmitted through clothing, linens, or towels. Apply the permethrin from head to soles of feet at bedtime, leave on for 8-14 hours. Mites tend to persist under the fingernails. Trim them, scrub beneath them, and apply the permetrhin under your nails. Repeat 1 week later. You will also need to treat family members, frequent household guests, close physical or sexual contacts, even if they have no symptoms. Wash all clothing, bedding, and towels in hot water, dry cleaned, or placed through the heat cycle of a dryer to prevent reinfection. Or you can place all bedding and clothing that might be infexted in sealed plastic bags for 72 hours.  Make sure you thoroughly clean the infected person's room. The itching will not go away immediately. Continued itching does not mean that the treatment was ineffective. Dead mites and eggs will cause an immune response but will eventually go away as the skin turns over. Go to www.goodrx.com to look up your medications. This will give you a list of where you can find your prescriptions at the most affordable prices.   °

## 2015-05-24 NOTE — Telephone Encounter (Signed)
Pt. Came in today requesting to speak to nurse because pt. Thinks she is having an allergic reaction. Pt. Stated she started braking out when she was prescribed eye drops from the Dr. Claiborne Billingshat did the laser eye surgery. Please f/u with pt.

## 2015-05-24 NOTE — ED Notes (Signed)
Pt c/o poss allergic reaction to eye drops she recently started; Ketorolac opth 0.4% Sx include rash all over body Denies dyspnea, SOB A&O x4... No acute distress.

## 2015-05-25 NOTE — Telephone Encounter (Signed)
Return pt call Pt stated doing better today. [facility] Elimite 5% cream Advised if no improvement schedule appointment with PCP

## 2015-07-12 ENCOUNTER — Encounter: Payer: Self-pay | Admitting: Family Medicine

## 2015-07-12 ENCOUNTER — Ambulatory Visit: Payer: BLUE CROSS/BLUE SHIELD | Attending: Family Medicine | Admitting: Family Medicine

## 2015-07-12 VITALS — BP 107/69 | HR 82 | Temp 98.4°F | Resp 16 | Ht 67.0 in | Wt 250.0 lb

## 2015-07-12 DIAGNOSIS — R0683 Snoring: Secondary | ICD-10-CM | POA: Insufficient documentation

## 2015-07-12 DIAGNOSIS — E139 Other specified diabetes mellitus without complications: Secondary | ICD-10-CM | POA: Diagnosis not present

## 2015-07-12 DIAGNOSIS — H409 Unspecified glaucoma: Secondary | ICD-10-CM | POA: Diagnosis not present

## 2015-07-12 DIAGNOSIS — E089 Diabetes mellitus due to underlying condition without complications: Secondary | ICD-10-CM

## 2015-07-12 DIAGNOSIS — E669 Obesity, unspecified: Secondary | ICD-10-CM

## 2015-07-12 LAB — POCT GLYCOSYLATED HEMOGLOBIN (HGB A1C): Hemoglobin A1C: 7.4

## 2015-07-12 LAB — GLUCOSE, POCT (MANUAL RESULT ENTRY): POC Glucose: 126 mg/dl — AB (ref 70–99)

## 2015-07-12 MED ORDER — METFORMIN HCL 1000 MG PO TABS: 1000.0000 mg | ORAL_TABLET | Freq: Two times a day (BID) | ORAL | Status: DC

## 2015-07-12 NOTE — Assessment & Plan Note (Signed)
A: diabetes with A1c and weight creeping up P: metformin changed to 1000 mg BID Exercise  Low carb diet

## 2015-07-12 NOTE — Progress Notes (Signed)
Patient ID: Rhonda Miles, female   DOB: Jul 25, 1950, 64 y.o.   MRN: [id number]   Subjective:  Patient ID: Rhonda Miles, female    DOB: 08/02/1950  Age: 64 y.o. MRN: [id number]  CC: Diabetes   HPI Rhonda Miles presents for   1. CHRONIC DIABETES  Disease Monitoring  Blood Sugar Ranges: 89-140  Polyuria: no   Visual problems: no   Medication Compliance: yes  Medication Side Effects  Hypoglycemia: no   Preventitive Health Care  Eye Exam: last month, had laser surgery in October for glaucoma   Foot Exam:   Diet pattern: eating higher carbs   Exercise: minimal   2. Dry mouth: she snores. She has daytime somnolence. She treats dry mouth with sugar free hard candy.   Social History  Substance Use Topics  . Smoking status: Former Smoker    Quit date: 10/22/1983  . Smokeless tobacco: Never Used  . Alcohol Use: No    Outpatient Prescriptions Prior to Visit  Medication Sig Dispense Refill  . Blood Glucose Monitoring Suppl (BLOOD GLUCOSE METER) kit Use as instructed 1 each 0  . cholecalciferol (VITAMIN D) 1000 UNITS tablet Take 1,000 Units by mouth daily.    Marland Kitchen glucose blood test strip Use as instructed 100 each 12  . metFORMIN (GLUCOPHAGE) 850 MG tablet Take 1 tablet (850 mg total) by mouth 2 (two) times daily with a meal. 60 tablet 5  . latanoprost (XALATAN) 0.005 % ophthalmic solution 1 drop at bedtime. Reported on 07/12/2015    . permethrin (ELIMITE) 5 % cream Apply from chin down, leave on for 8-14 hours, rinse. Repeat in 1 week (Patient not taking: Reported on 07/12/2015) 60 g 0  . triamcinolone cream (KENALOG) 0.1 % Apply 1 application topically 2 (two) times daily. Apply for 2 weeks. May use on face (Patient not taking: Reported on 07/12/2015) 30 g 0  . zoster vaccine live, PF, (ZOSTAVAX) 45809 UNT/0.65ML injection Inject 19,400 Units into the skin once. (Patient not taking: Reported on 07/12/2015) 1 each 0   No facility-administered medications prior to visit.     ROS Review of Systems  Constitutional: Positive for fatigue. Negative for fever and chills.  Eyes: Negative for visual disturbance.  Respiratory: Negative for shortness of breath.   Cardiovascular: Negative for chest pain.  Gastrointestinal: Negative for abdominal pain and blood in stool.  Endocrine: Positive for polydipsia.  Musculoskeletal: Negative for back pain and arthralgias.  Skin: Negative for rash.  Allergic/Immunologic: Negative for immunocompromised state.  Neurological: Positive for headaches.  Hematological: Negative for adenopathy. Does not bruise/bleed easily.  Psychiatric/Behavioral: Negative for suicidal ideas and dysphoric mood.    Objective:  BP 107/69 mmHg  Pulse 82  Temp(Src) 98.4 F (36.9 C) (Oral)  Resp 16  Ht '5\' 7"'  (1.702 m)  Wt 250 lb (113.399 kg)  BMI 39.15 kg/m2  SpO2 97%  BP/Weight 07/12/2015 98/09/3823 0/11/3974  Systolic BP 734 193 790  Diastolic BP 69 60 69  Wt. (Lbs) 250 - 241  BMI 39.15 - 37.74   Physical Exam  Constitutional: She is oriented to person, place, and time. She appears well-developed and well-nourished. No distress.  HENT:  Head: Normocephalic and atraumatic.  Cardiovascular: Normal rate, regular rhythm, normal heart sounds and intact distal pulses.   Pulmonary/Chest: Effort normal and breath sounds normal.  Musculoskeletal: She exhibits no edema.  Neurological: She is alert and oriented to person, place, and time.  Skin: Skin is warm and dry. No rash noted.  Psychiatric: She has a normal mood and affect.    Lab Results  Component Value Date   HGBA1C 7.40 07/12/2015  CBG 126   Assessment & Plan:   Problem List Items Addressed This Visit    Diabetes mellitus due to underlying condition without complications ([place]) - Primary (Chronic)   Relevant Medications   metFORMIN (GLUCOPHAGE) 1000 MG tablet   Other Relevant Orders   POCT glycosylated hemoglobin (Hb A1C) (Completed)   POCT glucose (manual entry) (Completed)    Glaucoma (increased eye pressure) (Chronic)   Obesity (Chronic)   Relevant Medications   metFORMIN (GLUCOPHAGE) 1000 MG tablet   Other Relevant Orders   Split night study   Snoring   Relevant Orders   Split night study    Other Visit Diagnoses    Other specified diabetes mellitus without complications ([place])        Relevant Medications    metFORMIN (GLUCOPHAGE) 1000 MG tablet       No orders of the defined types were placed in this encounter.    Follow-up: No Follow-up on file.   Boykin Nearing MD

## 2015-07-12 NOTE — Progress Notes (Signed)
F/U DM Taking medication as prescribed  Glucose been running 89-140 No pain today  No suicidal thought in the past two weeks

## 2015-07-12 NOTE — Assessment & Plan Note (Signed)
Snoring in obese patient with daytime somnolence. Suspect OSA  Sleep study Weight loss

## 2015-07-12 NOTE — Patient Instructions (Addendum)
Rhonda [name] was seen today for diabetes.  Diagnoses and all orders for this visit:  Diabetes mellitus due to underlying condition without complication, unspecified long term insulin use status (HCC) -     POCT glycosylated hemoglobin (Hb A1C) -     POCT glucose (manual entry)  Other specified diabetes mellitus without complications (HCC) -     metFORMIN (GLUCOPHAGE) 1000 MG tablet; Take 1 tablet (1,000 mg total) by mouth 2 (two) times daily with a meal.  Glaucoma (increased eye pressure)  Snoring -     Split night study; Future  Obesity -     Split night study; Future   You will be called about your sleep study Increase exercise to help gain muscle and improve circulation which will reduce sugar  F/u in 3 months for diabetes   Dr. Armen PickupFunches

## 2015-09-09 ENCOUNTER — Ambulatory Visit (HOSPITAL_BASED_OUTPATIENT_CLINIC_OR_DEPARTMENT_OTHER): Payer: BLUE CROSS/BLUE SHIELD | Attending: Family Medicine

## 2015-09-09 DIAGNOSIS — Z6839 Body mass index (BMI) 39.0-39.9, adult: Secondary | ICD-10-CM | POA: Insufficient documentation

## 2015-09-09 DIAGNOSIS — E669 Obesity, unspecified: Secondary | ICD-10-CM | POA: Diagnosis not present

## 2015-09-09 DIAGNOSIS — R0683 Snoring: Secondary | ICD-10-CM | POA: Diagnosis not present

## 2015-09-17 DIAGNOSIS — R0683 Snoring: Secondary | ICD-10-CM | POA: Diagnosis not present

## 2015-09-17 NOTE — Sleep Study (Signed)
  Patient Name: Rhonda Miles, Rhonda Miles Date: 09/09/2015 Gender: Female D.O.B: [birth year] Age (years): 75 Referring Provider: Gerilyn Nestle Funches Height (inches): 67 Interpreting Physician: Jetty Duhamel MD, ABSM Weight (lbs): 251 RPSGT: Wylie Hail BMI: 39 MRN: [id number] Neck Size: 16.00 CLINICAL INFORMATION Sleep Study Type: NPSG Indication for sleep study: Obesity, Snoring Epworth Sleepiness Score: 10  SLEEP STUDY TECHNIQUE As per the AASM Manual for the Scoring of Sleep and Associated Events v2.3 (April 2016) with a hypopnea requiring 4% desaturations. The channels recorded and monitored were frontal, central and occipital EEG, electrooculogram (EOG), submentalis EMG (chin), nasal and oral airflow, thoracic and abdominal wall motion, anterior tibialis EMG, snore microphone, electrocardiogram, and pulse oximetry.  MEDICATIONS Patient's medications include: charted for review. Medications self-administered by patient during sleep study : No sleep medicine administered.  SLEEP ARCHITECTURE The study was initiated at 10:55:38 PM and ended at 5:01:56 AM. Sleep onset time was 15.1 minutes and the sleep efficiency was 78.8%. The total sleep time was 288.7 minutes. Stage REM latency was 185.0 minutes. The patient spent 10.91% of the night in stage N1 sleep, 78.18% in stage N2 sleep, 0.00% in stage N3 and 10.91% in REM. Alpha intrusion was absent. Supine sleep was 23.44%.  RESPIRATORY PARAMETERS The overall apnea/hypopnea index (AHI) was 5.0 per hour. There were 18 total apneas, including 18 obstructive, 0 central and 0 mixed apneas. There were 6 hypopneas and 2 RERAs. The AHI during Stage REM sleep was 41.9 per hour. AHI while supine was 0.0 per hour. The mean oxygen saturation was 94.00%. The minimum SpO2 during sleep was 86.00%. Moderate snoring was noted during this study.  CARDIAC DATA The 2 lead EKG demonstrated sinus rhythm. The mean heart rate was 73.63 beats per minute.  Other EKG findings include: None.  LEG MOVEMENT DATA The total PLMS were 0 with a resulting PLMS index of 0.00. Associated arousal with leg movement index was 0.0 .  IMPRESSIONS - Borderline/ upper limit of normal for obstructive sleep apnea occurred during this study (AHI = 5.0/h). - No significant central sleep apnea occurred during this study (CAI = 0.0/h). - Mild oxygen desaturation was noted during this study (Min O2 = 86.00%). - The patient snored with Moderate snoring volume. - No cardiac abnormalities were noted during this study. - Clinically significant periodic limb movements did not occur during sleep. No significant associated arousals.  DIAGNOSIS - Normal study  RECOMMENDATIONS - Borderline/ upper limit of normal.. Return to discuss treatment options. - Avoid alcohol, sedatives and other CNS depressants that may worsen sleep apnea and disrupt normal sleep architecture. - Sleep hygiene should be reviewed to assess factors that may improve sleep quality. - Weight management and regular exercise should be initiated or continued if appropriate.  Waymon Budge Diplomate, American Board of Sleep Medicine  ELECTRONICALLY SIGNED ON:  09/17/2015, 10:10 AM [facility] PH: [phone]   FX: [phone] ACCREDITED BY THE AMERICAN ACADEMY OF SLEEP MEDICINE

## 2015-10-27 ENCOUNTER — Telehealth: Payer: Self-pay | Admitting: Family Medicine

## 2015-10-27 NOTE — Telephone Encounter (Signed)
Please inform patient that her sleep study was normal but close to sleep apnea. The following is recommended  - Avoid alcohol, sedatives and other CNS depressants that may worsen sleep apnea and disrupt normal sleep architecture.  - Sleep hygiene should be reviewed to assess factors that may improve sleep quality. Work on sleep hygiene: Bedroom is for  sleep and sex only. If you cannot sleep within 20 minutes do the following: Get out of bed and go to dimly lit room Read a book You may drink water or chamomile unsweetened tea-avoid food and any other beverage Avoid the phone and TV  - Weight management

## 2015-10-31 ENCOUNTER — Encounter: Payer: Self-pay | Admitting: Family Medicine

## 2015-10-31 ENCOUNTER — Ambulatory Visit: Payer: BLUE CROSS/BLUE SHIELD | Attending: Family Medicine | Admitting: Family Medicine

## 2015-10-31 ENCOUNTER — Encounter: Payer: Self-pay | Admitting: Clinical

## 2015-10-31 ENCOUNTER — Telehealth: Payer: Self-pay | Admitting: *Deleted

## 2015-10-31 VITALS — BP 117/73 | HR 81 | Temp 98.4°F | Resp 16 | Ht 67.0 in | Wt 249.0 lb

## 2015-10-31 DIAGNOSIS — K5900 Constipation, unspecified: Secondary | ICD-10-CM | POA: Insufficient documentation

## 2015-10-31 DIAGNOSIS — G8929 Other chronic pain: Secondary | ICD-10-CM

## 2015-10-31 DIAGNOSIS — Z1159 Encounter for screening for other viral diseases: Secondary | ICD-10-CM

## 2015-10-31 DIAGNOSIS — Z114 Encounter for screening for human immunodeficiency virus [HIV]: Secondary | ICD-10-CM

## 2015-10-31 DIAGNOSIS — E119 Type 2 diabetes mellitus without complications: Secondary | ICD-10-CM | POA: Insufficient documentation

## 2015-10-31 DIAGNOSIS — Z7984 Long term (current) use of oral hypoglycemic drugs: Secondary | ICD-10-CM | POA: Insufficient documentation

## 2015-10-31 DIAGNOSIS — M545 Low back pain, unspecified: Secondary | ICD-10-CM | POA: Insufficient documentation

## 2015-10-31 DIAGNOSIS — Z79899 Other long term (current) drug therapy: Secondary | ICD-10-CM | POA: Diagnosis not present

## 2015-10-31 DIAGNOSIS — K5901 Slow transit constipation: Secondary | ICD-10-CM | POA: Diagnosis not present

## 2015-10-31 DIAGNOSIS — E089 Diabetes mellitus due to underlying condition without complications: Secondary | ICD-10-CM

## 2015-10-31 DIAGNOSIS — Z1231 Encounter for screening mammogram for malignant neoplasm of breast: Secondary | ICD-10-CM

## 2015-10-31 DIAGNOSIS — R200 Anesthesia of skin: Secondary | ICD-10-CM | POA: Insufficient documentation

## 2015-10-31 DIAGNOSIS — Z87891 Personal history of nicotine dependence: Secondary | ICD-10-CM | POA: Insufficient documentation

## 2015-10-31 HISTORY — DX: Low back pain, unspecified: M54.50

## 2015-10-31 HISTORY — DX: Low back pain, unspecified: G89.29

## 2015-10-31 LAB — LIPID PANEL
CHOL/HDL RATIO: 2.9 ratio (ref <=5.0)
CHOLESTEROL: 140 mg/dL (ref 125–200)
HDL: 48 mg/dL (ref >=46)
LDL Cholesterol: 77 mg/dL (ref <130)
TRIGLYCERIDES: 75 mg/dL (ref <150)
VLDL: 15 mg/dL (ref <30)

## 2015-10-31 LAB — CBC
HEMATOCRIT: 37.4 % (ref 35.0–45.0)
Hemoglobin: 11.7 g/dL (ref 11.7–15.5)
MCH: 24.1 pg — ABNORMAL LOW (ref 27.0–33.0)
MCHC: 31.3 g/dL — ABNORMAL LOW (ref 32.0–36.0)
MCV: 77.1 fL — ABNORMAL LOW (ref 80.0–100.0)
MPV: 10.6 fL (ref 7.5–12.5)
Platelets: 319 10*3/uL (ref 140–400)
RBC: 4.85 MIL/uL (ref 3.80–5.10)
RDW: 16.3 % — AB (ref 11.0–15.0)
WBC: 5.3 10*3/uL (ref 3.8–10.8)

## 2015-10-31 LAB — HEPATITIS C ANTIBODY: HCV Ab: REACTIVE — AB

## 2015-10-31 LAB — COMPLETE METABOLIC PANEL WITH GFR
ALT: 19 U/L (ref 6–29)
AST: 12 U/L (ref 10–35)
Albumin: 4 g/dL (ref 3.6–5.1)
Alkaline Phosphatase: 61 U/L (ref 33–130)
BILIRUBIN TOTAL: 0.6 mg/dL (ref 0.2–1.2)
BUN: 15 mg/dL (ref 7–25)
CALCIUM: 9.3 mg/dL (ref 8.6–10.4)
CHLORIDE: 103 mmol/L (ref 98–110)
CO2: 28 mmol/L (ref 20–31)
CREATININE: 0.52 mg/dL (ref 0.50–0.99)
GFR, Est African American: >89 mL/min (ref >=60)
GFR, Est Non African American: >89 mL/min (ref >=60)
Glucose, Bld: 108 mg/dL — ABNORMAL HIGH (ref 65–99)
Potassium: 4.2 mmol/L (ref 3.5–5.3)
Sodium: 140 mmol/L (ref 135–146)
TOTAL PROTEIN: 7.2 g/dL (ref 6.1–8.1)

## 2015-10-31 LAB — HIV ANTIBODY (ROUTINE TESTING W REFLEX): HIV 1&2 Ab, 4th Generation: NONREACTIVE

## 2015-10-31 LAB — POCT GLYCOSYLATED HEMOGLOBIN (HGB A1C): HEMOGLOBIN A1C: 7.9

## 2015-10-31 LAB — GLUCOSE, POCT (MANUAL RESULT ENTRY): POC GLUCOSE: 109 mg/dL — AB (ref 70–99)

## 2015-10-31 MED ORDER — METFORMIN HCL 1000 MG PO TABS: 1000.0000 mg | ORAL_TABLET | Freq: Two times a day (BID) | ORAL | Status: DC

## 2015-10-31 NOTE — Progress Notes (Signed)
Subjective:  Patient ID: Rhonda Miles, female    DOB: September 30, 1950  Age: 65 y.o. MRN: [id number]  CC: Diabetes   HPI AMALIYA WHITELAW presents for    1. CHRONIC DIABETES  Disease Monitoring  Blood Sugar Ranges: 109-150  Polyuria: no   Visual problems: no   Medication Compliance: yes  Medication Side Effects  Hypoglycemia: no   Preventitive Health Care  Eye Exam: done last October   Foot Exam: done today   Diet pattern: eating fruit, working on low carb   Exercise: minimal   2. Back pain: chronic. Low back. Has feeling of numbness in anterior thighs. Has weakness in leg at times. No fecal or urinary incontinence.   Social History  Substance Use Topics  . Smoking status: Former Smoker    Quit date: 10/22/1983  . Smokeless tobacco: Never Used  . Alcohol Use: No    Outpatient Prescriptions Prior to Visit  Medication Sig Dispense Refill  . Blood Glucose Monitoring Suppl (BLOOD GLUCOSE METER) kit Use as instructed 1 each 0  . cholecalciferol (VITAMIN D) 1000 UNITS tablet Take 1,000 Units by mouth daily.    Marland Kitchen glucose blood test strip Use as instructed 100 each 12  . metFORMIN (GLUCOPHAGE) 1000 MG tablet Take 1 tablet (1,000 mg total) by mouth 2 (two) times daily with a meal. 60 tablet 5  . latanoprost (XALATAN) 0.005 % ophthalmic solution 1 drop at bedtime. Reported on 10/31/2015     No facility-administered medications prior to visit.    ROS Review of Systems  Constitutional: Positive for fatigue. Negative for fever and chills.  Eyes: Negative for visual disturbance.  Respiratory: Negative for shortness of breath.   Cardiovascular: Negative for chest pain.  Gastrointestinal: Positive for constipation. Negative for abdominal pain and blood in stool.  Musculoskeletal: Positive for back pain. Negative for arthralgias.  Skin: Negative for rash.  Allergic/Immunologic: Negative for immunocompromised state.  Hematological: Negative for adenopathy. Does not bruise/bleed  easily.  Psychiatric/Behavioral: Negative for suicidal ideas and dysphoric mood.    Objective:  BP 117/73 mmHg  Pulse 81  Temp(Src) 98.4 F (36.9 C) (Oral)  Resp 16  Ht '5\' 7"'  (1.702 m)  Wt 249 lb (112.946 kg)  BMI 38.99 kg/m2  SpO2 97%  BP/Weight 10/31/2015 07/12/2015 40/07/270  Systolic BP 536 644 034  Diastolic BP 73 69 60  Wt. (Lbs) 249 250 -  BMI 38.99 39.15 -   Physical Exam  Constitutional: She is oriented to person, place, and time. She appears well-developed and well-nourished. No distress.  HENT:  Head: Normocephalic and atraumatic.  Cardiovascular: Normal rate, regular rhythm, normal heart sounds and intact distal pulses.   Pulmonary/Chest: Effort normal and breath sounds normal.  Musculoskeletal: She exhibits no edema.  Neurological: She is alert and oriented to person, place, and time.  Skin: Skin is warm and dry. No rash noted.  Psychiatric: She has a normal mood and affect.   Lab Results  Component Value Date   HGBA1C 7.9 10/31/2015   CBG 109  Assessment & Plan:   Anyi was seen today for diabetes.  Diagnoses and all orders for this visit:  Diabetes mellitus due to underlying condition without complication, unspecified long term insulin use status (HCC) -     POCT glycosylated hemoglobin (Hb A1C) -     POCT glucose (manual entry) -     metFORMIN (GLUCOPHAGE) 1000 MG tablet; Take 1 tablet (1,000 mg total) by mouth 2 (two) times daily with a  meal. -     CBC -     COMPLETE METABOLIC PANEL WITH GFR -     Lipid Panel  Chronic bilateral low back pain without sciatica  Slow transit constipation  Screening for HIV (human immunodeficiency virus) -     HIV antibody (with reflex)  Need for hepatitis C screening test -     Hepatitis C antibody, reflex  Encounter for screening mammogram for breast cancer    No orders of the defined types were placed in this encounter.    Follow-up: No Follow-up on file.   Boykin Nearing MD

## 2015-10-31 NOTE — Patient Instructions (Addendum)
Rhonda [name] was seen today for diabetes.  Diagnoses and all orders for this visit:  Diabetes mellitus due to underlying condition without complication, unspecified long term insulin use status ([facility]CC) -     POCT glycosylated hemoglobin (Hb A1C) -     POCT glucose (manual entry) -     metFORMIN (GLUCOPHAGE) 1000 MG tablet; Take 1 tablet (1,000 mg total) by mouth 2 (two) times daily with a meal. -     CBC -     COMPLETE METABOLIC PANEL WITH GFR -     Lipid Panel  Chronic bilateral low back pain without sciatica  Slow transit constipation  Screening for HIV (human immunodeficiency virus) -     HIV antibody (with reflex)  Need for hepatitis C screening test -     Hepatitis C antibody, reflex  Encounter for screening mammogram for breast cancer   Step up exercise and low sugar/carb diet to help control sugars and lose weight.  Diabetes blood sugar goals  Fasting (in AM before breakfast, 8 hrs of no eating or drinking (except water or unsweetened coffee or tea): 90-110 2 hrs after meals: < 160,   No low sugars: nothing < 70   F/u in 3 months for diabetes   Dr. Armen PickupFunches

## 2015-10-31 NOTE — Progress Notes (Signed)
[facility] 10/31/2015 03/29/2015 03/29/2015  Decreased Interest 2 0 0  Down, Depressed, Hopeless 0 0 0  PHQ - 2 Score 2 0 0  Altered sleeping 1 - -  Tired, decreased energy 2 - -  Change in appetite 0 - -  Feeling bad or failure about yourself  0 - -  Trouble concentrating 2 - -  Moving slowly or fidgety/restless 0 - -  Suicidal thoughts 0 - -  PHQ-9 Score 7 - -    GAD 7 : Generalized Anxiety Score 10/31/2015  Nervous, Anxious, on Edge 2  Control/stop worrying 0  Worry too much - different things 0  Trouble relaxing 0  Restless 0  Easily annoyed or irritable 0  Afraid - awful might happen 0  Total GAD 7 Score 2

## 2015-10-31 NOTE — Progress Notes (Signed)
F/U DM  Taking medication as prescribed  Glucose running 109-150  No tobacco user  No suicidal thoughts in the past two weeks  No pain today

## 2015-10-31 NOTE — Telephone Encounter (Signed)
PT requesting sleep sturdy results

## 2015-10-31 NOTE — Telephone Encounter (Signed)
Please see phone note from 10/27/2015

## 2015-11-01 LAB — HEPATITIS C RNA QUANTITATIVE: HCV Quantitative: NOT DETECTED IU/mL (ref <15)

## 2015-11-01 NOTE — Assessment & Plan Note (Signed)
Chronic low back pain in setting of obesity  Plan for weight loss by lowing carb intake and improving exercise

## 2015-11-01 NOTE — Assessment & Plan Note (Signed)
A: slight decline with rise in A1c,  P; Increase exercise Low carb diet

## 2015-11-02 NOTE — Telephone Encounter (Signed)
Date of birth verified by pt  Notified  sleep study was normal but close to sleep apnea. The following is recommended  Avoid alcohol- Sleep hygiene should be reviewed to assess factors that may improve sleep quality. Work on sleep hygiene: If you cannot sleep within 20 minutes do the following: Get out of bed and go to dimly lit room Read a book  You may drink water or chamomile unsweetened tea-avoid food and any other beverage Avoid the phone and TV Pt verbalized understanding

## 2016-02-20 ENCOUNTER — Ambulatory Visit: Payer: BLUE CROSS/BLUE SHIELD | Attending: Family Medicine | Admitting: Family Medicine

## 2016-02-20 ENCOUNTER — Encounter: Payer: Self-pay | Admitting: Family Medicine

## 2016-02-20 VITALS — BP 118/70 | HR 84 | Temp 98.6°F | Resp 17 | Ht 67.0 in | Wt 247.4 lb

## 2016-02-20 DIAGNOSIS — E669 Obesity, unspecified: Secondary | ICD-10-CM

## 2016-02-20 DIAGNOSIS — G8929 Other chronic pain: Secondary | ICD-10-CM

## 2016-02-20 DIAGNOSIS — M545 Low back pain, unspecified: Secondary | ICD-10-CM

## 2016-02-20 DIAGNOSIS — E089 Diabetes mellitus due to underlying condition without complications: Secondary | ICD-10-CM | POA: Diagnosis not present

## 2016-02-20 LAB — GLUCOSE, POCT (MANUAL RESULT ENTRY): POC GLUCOSE: 136 mg/dL — AB (ref 70–99)

## 2016-02-20 LAB — POCT GLYCOSYLATED HEMOGLOBIN (HGB A1C): HEMOGLOBIN A1C: 8

## 2016-02-20 MED ORDER — TRAMADOL HCL 50 MG PO TABS: 50.0000 mg | ORAL_TABLET | Freq: Three times a day (TID) | ORAL | 0 refills | Status: DC | PRN

## 2016-02-20 MED ORDER — METFORMIN HCL 1000 MG PO TABS: ORAL_TABLET | ORAL | 3 refills | Status: DC

## 2016-02-20 MED ORDER — CYCLOBENZAPRINE HCL 10 MG PO TABS: 10.0000 mg | ORAL_TABLET | Freq: Three times a day (TID) | ORAL | 0 refills | Status: DC | PRN

## 2016-02-20 NOTE — Patient Instructions (Addendum)
Rhonda Miles was seen today for diabetes.  Diagnoses and all orders for this visit:  Diabetes mellitus due to underlying condition without complication, without long-term current use of insulin (HCC) -     Glucose (CBG) -     HgB A1c -     metFORMIN (GLUCOPHAGE) 1000 MG tablet; Take 1000 mg with breakfast, 500 mg with lunch, 1000 mg with dinner  Chronic left-sided low back pain without sciatica -     traMADol (ULTRAM) 50 MG tablet; Take 1 tablet (50 mg total) by mouth every 8 (eight) hours as needed. -     cyclobenzaprine (FLEXERIL) 10 MG tablet; Take 1 tablet (10 mg total) by mouth 3 (three) times daily as needed for muscle spasms.   Increase exercise to 150-180 minutes per week. Try to exercise in the morning between work and home.   F/u in 3 months for diabetes and weight check  Dr. Armen Pickup

## 2016-02-20 NOTE — Progress Notes (Signed)
Subjective:  Patient ID: Rhonda Miles, female    DOB: 11/21/1950  Age: 65 y.o. MRN: [id number]  CC: No chief complaint on file.   HPI Rhonda Miles presents for    1. CHRONIC DIABETES  Disease Monitoring  Blood Sugar Ranges: 109-150  Polyuria: no   Visual problems: no   Medication Compliance: yes  Medication Side Effects  Hypoglycemia: no   Preventitive Health Care  Eye Exam: done last October   Foot Exam: done today   Diet pattern: eating fruit, working on low carb   Exercise: minimal   2. Back pain: chronic. Low back. Has feeling of numbness in anterior thighs that is worse on the L side. Has weakness in leg at times. No fecal or urinary incontinence. She went to urgent care last month and was treated with tramadol and flexeril for flare up of pain. She now sleeps with a pillow between her legs which helps. She exercises once a month.    Social History  Substance Use Topics  . Smoking status: Former Smoker    Quit date: 10/22/1983  . Smokeless tobacco: Never Used  . Alcohol use No    Outpatient Medications Prior to Visit  Medication Sig Dispense Refill  . bisacodyl (DULCOLAX) 5 MG EC tablet Take 5 mg by mouth 2 (two) times daily as needed for moderate constipation.    . Blood Glucose Monitoring Suppl (BLOOD GLUCOSE METER) kit Use as instructed 1 each 0  . cholecalciferol (VITAMIN D) 1000 UNITS tablet Take 1,000 Units by mouth daily.    Marland Kitchen glucose blood test strip Use as instructed 100 each 12  . metFORMIN (GLUCOPHAGE) 1000 MG tablet Take 1 tablet (1,000 mg total) by mouth 2 (two) times daily with a meal. 60 tablet 5   No facility-administered medications prior to visit.     ROS Review of Systems  Constitutional: Negative for chills, fatigue and fever.  Eyes: Negative for visual disturbance.  Respiratory: Negative for shortness of breath.   Cardiovascular: Negative for chest pain.  Gastrointestinal: Negative for abdominal pain, blood in stool and  constipation.  Musculoskeletal: Positive for back pain. Negative for arthralgias.  Skin: Negative for rash.  Allergic/Immunologic: Negative for immunocompromised state.  Hematological: Negative for adenopathy. Does not bruise/bleed easily.  Psychiatric/Behavioral: Negative for dysphoric mood and suicidal ideas.    Objective:  BP 118/70 (BP Location: Left Arm, Patient Position: Sitting, Cuff Size: Large)   Pulse 84   Temp 98.6 F (37 C) (Oral)   Resp 17   Ht '5\' 7"'  (1.702 m)   Wt 247 lb 6.4 oz (112.2 kg)   SpO2 98%   BMI 38.75 kg/m   BP/Weight 02/20/2016 10/31/2015 32/99/2426  Systolic BP 834 196 222  Diastolic BP 70 73 69  Wt. (Lbs) 247.4 249 250  BMI 38.75 38.99 39.15   Physical Exam  Constitutional: She is oriented to person, place, and time. She appears well-developed and well-nourished. No distress.  HENT:  Head: Normocephalic and atraumatic.  Cardiovascular: Normal rate, regular rhythm, normal heart sounds and intact distal pulses.   Pulmonary/Chest: Effort normal and breath sounds normal.  Musculoskeletal: She exhibits no edema.  Neurological: She is alert and oriented to person, place, and time.  Skin: Skin is warm and dry. No rash noted.  Psychiatric: She has a normal mood and affect.   Lab Results  Component Value Date   HGBA1C 7.9 10/31/2015   Lab Results  Component Value Date   HGBA1C 8.0 02/20/2016  CBG 136  Assessment & Plan:   Rhonda Miles was seen today for diabetes.  Diagnoses and all orders for this visit:  Diabetes mellitus due to underlying condition without complication, without long-term current use of insulin ([facility]) -     Glucose (CBG) -     HgB A1c -     metFORMIN (GLUCOPHAGE) 1000 MG tablet; Take 1000 mg with breakfast, 500 mg with lunch, 1000 mg with dinner  Chronic left-sided low back pain without sciatica -     traMADol (ULTRAM) 50 MG tablet; Take 1 tablet (50 mg total) by mouth every 8 (eight) hours as needed. -     cyclobenzaprine  (FLEXERIL) 10 MG tablet; Take 1 tablet (10 mg total) by mouth 3 (three) times daily as needed for muscle spasms.  Obesity    No orders of the defined types were placed in this encounter.   Follow-up: Return in about 3 months (around 05/22/2016).   Boykin Nearing MD

## 2016-02-20 NOTE — Assessment & Plan Note (Signed)
Obesity with diabetes and chronic low back pain and meralgia paresthetica in thigh  Advised increase exercise  Weight loss Patient's goal is < 200#

## 2016-05-15 LAB — HM DIABETES EYE EXAM

## 2016-05-29 ENCOUNTER — Ambulatory Visit: Payer: BLUE CROSS/BLUE SHIELD | Attending: Family Medicine | Admitting: Family Medicine

## 2016-05-29 ENCOUNTER — Encounter: Payer: Self-pay | Admitting: Family Medicine

## 2016-05-29 VITALS — BP 128/79 | HR 83 | Temp 97.5°F | Ht 67.0 in | Wt 239.0 lb

## 2016-05-29 DIAGNOSIS — M25532 Pain in left wrist: Secondary | ICD-10-CM

## 2016-05-29 DIAGNOSIS — Z7984 Long term (current) use of oral hypoglycemic drugs: Secondary | ICD-10-CM | POA: Diagnosis not present

## 2016-05-29 DIAGNOSIS — E119 Type 2 diabetes mellitus without complications: Secondary | ICD-10-CM

## 2016-05-29 DIAGNOSIS — Z23 Encounter for immunization: Secondary | ICD-10-CM | POA: Diagnosis not present

## 2016-05-29 DIAGNOSIS — Z87891 Personal history of nicotine dependence: Secondary | ICD-10-CM | POA: Insufficient documentation

## 2016-05-29 DIAGNOSIS — E089 Diabetes mellitus due to underlying condition without complications: Secondary | ICD-10-CM

## 2016-05-29 LAB — GLUCOSE, POCT (MANUAL RESULT ENTRY): POC Glucose: 128 mg/dl — AB (ref 70–99)

## 2016-05-29 LAB — POCT GLYCOSYLATED HEMOGLOBIN (HGB A1C): Hemoglobin A1C: 7.4

## 2016-05-29 MED ORDER — METFORMIN HCL ER 500 MG PO TB24: ORAL_TABLET | ORAL | 5 refills | Status: DC

## 2016-05-29 MED ORDER — NAPROXEN 500 MG PO TABS: 500.0000 mg | ORAL_TABLET | Freq: Two times a day (BID) | ORAL | 0 refills | Status: DC

## 2016-05-29 NOTE — Progress Notes (Signed)
Subjective:  Patient ID: Rhonda Miles, female    DOB: 1951/06/12  Age: 65 y.o. MRN: [id number]  CC: Diabetes   HPI Rhonda Miles presents for    1. CHRONIC DIABETES  Disease Monitoring. not checking   Polyuria: no   Visual problems: no   Medication Compliance: no  Medication Side Effects  Hypoglycemia: no   Preventitive Health Care  Eye Exam: yes, went May 15, 2016. Saw Dr. Katy Miles. Due for f.u in 6 months.   Foot Exam: done recently   Diet pattern: eating low sugar, 3 meals per day   Exercise: yes   2. Left wrist pain:  Started 2 weeks ago. Slight swelling. Dorsal ulnar wrist pain. No pain at rest. Pain with movement. No injury. 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 Pain comes and goes. Pain is currently 9/10.   Social History  Substance Use Topics  . Smoking status: Former Smoker    Quit date: 10/22/1983  . Smokeless tobacco: Never Used  . Alcohol use No    Outpatient Medications Prior to Visit  Medication Sig Dispense Refill  . bisacodyl (DULCOLAX) 5 MG EC tablet Take 5 mg by mouth 2 (two) times daily as needed for moderate constipation.    . Blood Glucose Monitoring Suppl (BLOOD GLUCOSE METER) kit Use as instructed 1 each 0  . cholecalciferol (VITAMIN D) 1000 UNITS tablet Take 1,000 Units by mouth daily.    . cyclobenzaprine (FLEXERIL) 10 MG tablet Take 1 tablet (10 mg total) by mouth 3 (three) times daily as needed for muscle spasms. 30 tablet 0  . glucose blood test strip Use as instructed 100 each 12  . metFORMIN (GLUCOPHAGE) 1000 MG tablet Take 1000 mg with breakfast, 500 mg with lunch, 1000 mg with dinner 225 tablet 3  . traMADol (ULTRAM) 50 MG tablet Take 1 tablet (50 mg total) by mouth every 8 (eight) hours as needed. 30 tablet 0   No facility-administered medications prior to visit.     ROS Review of Systems  Constitutional: Negative for chills, fatigue and fever.  Eyes: Negative for visual disturbance.   Respiratory: Negative for shortness of breath.   Cardiovascular: Negative for chest pain.  Gastrointestinal: Negative for abdominal pain, blood in stool and constipation.  Musculoskeletal: Positive for arthralgias. Negative for back pain.  Skin: Negative for rash.  Allergic/Immunologic: Negative for immunocompromised state.  Hematological: Negative for adenopathy. Does not bruise/bleed easily.  Psychiatric/Behavioral: Negative for dysphoric mood and suicidal ideas.    Objective:  BP 128/79 (BP Location: Left Arm, Patient Position: Sitting, Cuff Size: Small)   Pulse 83   Temp 97.5 F (36.4 C) (Oral)   Ht '5\' 7"'  (1.702 m)   Wt 239 lb (108.4 kg)   SpO2 96%   BMI 37.43 kg/m   BP/Weight 05/29/2016 02/20/2016 7/62/8315  Systolic BP 176 160 737  Diastolic BP 79 70 73  Wt. (Lbs) 239 247.4 249  BMI 37.43 38.75 38.99    Physical Exam  Constitutional: She is oriented to person, place, and time. She appears well-developed and well-nourished. No distress.  HENT:  Head: Normocephalic and atraumatic.  Cardiovascular: Normal rate, regular rhythm, normal heart sounds and intact distal pulses.   Pulmonary/Chest: Effort normal and breath sounds normal.  Musculoskeletal: She exhibits no edema.       Left wrist: She exhibits tenderness (dorsal ulnar wrist tenderness with extension ). She exhibits normal range of motion, no bony  tenderness, no swelling, no effusion, no crepitus, no deformity and no laceration.  Neurological: She is alert and oriented to person, place, and time.  Skin: Skin is warm and dry. No rash noted.  Psychiatric: She has a normal mood and affect.   Lab Results  Component Value Date   HGBA1C 7.4 05/29/2016   CBG 128   Assessment & Plan:  [name] was seen today for diabetes.  Diagnoses and all orders for this visit:  Diabetes mellitus due to underlying condition without complication, without long-term current use of insulin (HCC) -     POCT glucose (manual entry) -      POCT glycosylated hemoglobin (Hb A1C) -     metFORMIN (GLUCOPHAGE XR) 500 MG 24 hr tablet; Take by mouth 1000 mg after breakfast, 500 mg after lunch, 1000 mg after dinner -     Microalbumin/Creatinine Ratio, Urine  Left wrist pain -     naproxen (NAPROSYN) 500 MG tablet; Take 1 tablet (500 mg total) by mouth 2 (two) times daily with a meal.  Flu vaccine need -     Flu Vaccine QUAD 36+ mos IM   1No orders of the defined types were placed in this encounter.   Follow-up: Return in about 3 months (around 08/29/2016) for diabetes .   Boykin Nearing MD

## 2016-05-29 NOTE — Assessment & Plan Note (Signed)
Wrist pain without trauma, suspect osteoarthritic pain  take naproxen for next 5 days with food  Ice wrist nightly Wear wrist splint at least 8 hrs per day

## 2016-05-29 NOTE — Assessment & Plan Note (Signed)
Well-controlled.  Continue current regimen. 

## 2016-05-29 NOTE — Patient Instructions (Addendum)
Maureen [name] was seen today for diabetes.  Diagnoses and all orders for this visit:  Diabetes mellitus due to underlying condition without complication, without long-term current use of insulin (HCC) -     POCT glucose (manual entry) -     POCT glycosylated hemoglobin (Hb A1C) -     metFORMIN (GLUCOPHAGE XR) 500 MG 24 hr tablet; Take by mouth 1000 mg after breakfast, 500 mg after lunch, 1000 mg after dinner -     Microalbumin/Creatinine Ratio, Urine  Left wrist pain -     naproxen (NAPROSYN) 500 MG tablet; Take 1 tablet (500 mg total) by mouth 2 (two) times daily with a meal.  take naproxen for next 5 days with food  Ice wrist nightly Wear wrist splint at least 8 hrs per day   Fu in 3 months for diabetes   Dr. Armen PickupFunches

## 2016-05-29 NOTE — Progress Notes (Signed)
Pt is here today to follow up on diabetes. Pt is also having wrist pain(left).  Pt is getting flu shot today.

## 2016-05-30 LAB — MICROALBUMIN / CREATININE URINE RATIO
Creatinine, Urine: 144 mg/dL (ref 20–320)
Microalb Creat Ratio: 5 mcg/mg creat (ref <30)
Microalb, Ur: 0.7 mg/dL

## 2016-06-20 ENCOUNTER — Encounter: Payer: Self-pay | Admitting: Family Medicine

## 2016-09-20 ENCOUNTER — Telehealth: Payer: Self-pay | Admitting: Family Medicine

## 2016-09-20 DIAGNOSIS — E089 Diabetes mellitus due to underlying condition without complications: Secondary | ICD-10-CM

## 2016-09-20 MED ORDER — ATORVASTATIN CALCIUM 40 MG PO TABS: 40.0000 mg | ORAL_TABLET | Freq: Every day | ORAL | 3 refills | Status: DC

## 2016-09-20 NOTE — Telephone Encounter (Signed)
Patient called  Left message informing patient that mychart message sent regarding medication change.   sent mychart message.    Hello Ms. Neale BurlyFreeman,   Given your  age and diabetes status it is recommended that she start statin, Lipitor 40 mg daily to lower risk of heart disease and stroke. Statins lower cholesterol and lower risk of atherosclerosis (hardening and narrowing of arteries). There is a potential side effect of muscle soreness. Your cholesterol levels were normal last year. However, even with normal levels is it recommended to take a statin if have elevated risk for cardiovascular disease. Your risk is elevated above the average due to diabetes.   I have sent Lipitor to your [facility]. Please message me back if you have questions.   Dr. Armen PickupFunches

## 2016-09-20 NOTE — Telephone Encounter (Signed)
Pt. Called requesting to speak with her nurse regarding the mychart message that her PCP had sent her. Please f/u with pt.

## 2016-09-21 ENCOUNTER — Other Ambulatory Visit: Payer: Self-pay | Admitting: Family Medicine

## 2016-09-21 DIAGNOSIS — Z1231 Encounter for screening mammogram for malignant neoplasm of breast: Secondary | ICD-10-CM

## 2016-09-21 NOTE — Telephone Encounter (Signed)
Attempted to call back to patient Left VM Advised patient to call back or preferably send a mychart message regarding the lipitor

## 2016-09-21 NOTE — Telephone Encounter (Signed)
Will route to PCP 

## 2016-10-09 ENCOUNTER — Ambulatory Visit
Admission: RE | Admit: 2016-10-09 | Discharge: 2016-10-09 | Disposition: A | Discharge status: Home / Self Care | Payer: BLUE CROSS/BLUE SHIELD | Source: Ambulatory Visit | Attending: Family Medicine | Admitting: Family Medicine

## 2016-10-09 DIAGNOSIS — Z1231 Encounter for screening mammogram for malignant neoplasm of breast: Secondary | ICD-10-CM

## 2016-12-05 ENCOUNTER — Encounter: Payer: Self-pay | Admitting: Family Medicine

## 2016-12-07 ENCOUNTER — Other Ambulatory Visit: Payer: Self-pay | Admitting: Family Medicine

## 2016-12-07 ENCOUNTER — Encounter: Payer: Self-pay | Admitting: Family Medicine

## 2016-12-07 DIAGNOSIS — E089 Diabetes mellitus due to underlying condition without complications: Secondary | ICD-10-CM

## 2016-12-07 NOTE — Telephone Encounter (Signed)
Refill request prior to appointment

## 2016-12-09 MED ORDER — METFORMIN HCL ER 500 MG PO TB24: ORAL_TABLET | ORAL | 0 refills | Status: DC

## 2017-01-03 ENCOUNTER — Encounter: Payer: Self-pay | Admitting: Family Medicine

## 2017-01-03 ENCOUNTER — Ambulatory Visit: Payer: BLUE CROSS/BLUE SHIELD | Attending: Family Medicine | Admitting: Family Medicine

## 2017-01-03 VITALS — BP 138/78 | HR 88 | Temp 98.1°F | Wt 243.0 lb

## 2017-01-03 DIAGNOSIS — Z6838 Body mass index (BMI) 38.0-38.9, adult: Secondary | ICD-10-CM | POA: Insufficient documentation

## 2017-01-03 DIAGNOSIS — E669 Obesity, unspecified: Secondary | ICD-10-CM | POA: Insufficient documentation

## 2017-01-03 DIAGNOSIS — E089 Diabetes mellitus due to underlying condition without complications: Secondary | ICD-10-CM | POA: Diagnosis not present

## 2017-01-03 DIAGNOSIS — E119 Type 2 diabetes mellitus without complications: Secondary | ICD-10-CM | POA: Diagnosis not present

## 2017-01-03 DIAGNOSIS — Z7984 Long term (current) use of oral hypoglycemic drugs: Secondary | ICD-10-CM | POA: Insufficient documentation

## 2017-01-03 DIAGNOSIS — R030 Elevated blood-pressure reading, without diagnosis of hypertension: Secondary | ICD-10-CM | POA: Diagnosis not present

## 2017-01-03 DIAGNOSIS — Z87891 Personal history of nicotine dependence: Secondary | ICD-10-CM | POA: Diagnosis not present

## 2017-01-03 LAB — POCT GLYCOSYLATED HEMOGLOBIN (HGB A1C): Hemoglobin A1C: 7.6

## 2017-01-03 LAB — GLUCOSE, POCT (MANUAL RESULT ENTRY): POC Glucose: 196 mg/dl — AB (ref 70–99)

## 2017-01-03 MED ORDER — GLIPIZIDE ER 5 MG PO TB24: 5.0000 mg | ORAL_TABLET | Freq: Every day | ORAL | 5 refills | Status: DC

## 2017-01-03 MED ORDER — METFORMIN HCL ER 500 MG PO TB24: ORAL_TABLET | ORAL | 2 refills | Status: DC

## 2017-01-03 NOTE — Assessment & Plan Note (Signed)
Elevated BP with obesity Diabetes Plan: Reduce sodium intake Increase exercise Weight loss

## 2017-01-03 NOTE — Patient Instructions (Addendum)
Rhonda Miles was seen today for diabetes.  Diagnoses and all orders for this visit:  Diabetes mellitus due to underlying condition without complication, without long-term current use of insulin (HCC) -     HgB A1c -     Glucose (CBG) -     metFORMIN (GLUCOPHAGE-XR) 500 MG 24 hr tablet; TAKE 2 TABLETS BY MOUTH AFTER BREAKFAST, 1 TABLET BY MOUTH AFTER LUNCH, THEN 2 TABLETS AFTER DINNER -     glipiZIDE (GLIPIZIDE XL) 5 MG 24 hr tablet; Take 1 tablet (5 mg total) by mouth daily with breakfast.   Your repeat BP is borderline for HTN 138/78  Reduce salt intake in diet Increase exercise for weight loss, BP control and diabetes control  F/u in 3 months for diabetes  Dr. Armen Pickup    DASH Eating Plan DASH stands for "Dietary Approaches to Stop Hypertension." The DASH eating plan is a healthy eating plan that has been shown to reduce high blood pressure (hypertension). It may also reduce your risk for type 2 diabetes, heart disease, and stroke. The DASH eating plan may also help with weight loss. What are tips for following this plan? General guidelines  Avoid eating more than 2,300 mg (milligrams) of salt (sodium) a day. If you have hypertension, you may need to reduce your sodium intake to 1,500 mg a day.  Limit alcohol intake to no more than 1 drink a day for nonpregnant women and 2 drinks a day for men. One drink equals 12 oz of beer, 5 oz of wine, or 1 oz of hard liquor.  Work with your health care provider to maintain a healthy body weight or to lose weight. Ask what an ideal weight is for you.  Get at least 30 minutes of exercise that causes your heart to beat faster (aerobic exercise) most days of the week. Activities may include walking, swimming, or biking.  Work with your health care provider or diet and nutrition specialist (dietitian) to adjust your eating plan to your individual calorie needs. Reading food labels  Check food labels for the amount of sodium per serving. Choose foods  with less than 5 percent of the Daily Value of sodium. Generally, foods with less than 300 mg of sodium per serving fit into this eating plan.  To find whole grains, look for the word "whole" as the first word in the ingredient list. Shopping  Buy products labeled as "low-sodium" or "no salt added."  Buy fresh foods. Avoid canned foods and premade or frozen meals. Cooking  Avoid adding salt when cooking. Use salt-free seasonings or herbs instead of table salt or sea salt. Check with your health care provider or pharmacist before using salt substitutes.  Do not fry foods. Cook foods using healthy methods such as baking, boiling, grilling, and broiling instead.  Cook with heart-healthy oils, such as olive, canola, soybean, or sunflower oil. Meal planning   Eat a balanced diet that includes: ? 5 or more servings of fruits and vegetables each day. At each meal, try to fill half of your plate with fruits and vegetables. ? Up to 6-8 servings of whole grains each day. ? Less than 6 oz of lean meat, poultry, or fish each day. A 3-oz serving of meat is about the same size as a deck of cards. One egg equals 1 oz. ? 2 servings of low-fat dairy each day. ? A serving of nuts, seeds, or beans 5 times each week. ? Heart-healthy fats. Healthy fats called Omega-3 fatty acids  are found in foods such as flaxseeds and coldwater fish, like sardines, salmon, and mackerel.  Limit how much you eat of the following: ? Canned or prepackaged foods. ? Food that is high in trans fat, such as fried foods. ? Food that is high in saturated fat, such as fatty meat. ? Sweets, desserts, sugary drinks, and other foods with added sugar. ? Full-fat dairy products.  Do not salt foods before eating.  Try to eat at least 2 vegetarian meals each week.  Eat more home-cooked food and less restaurant, buffet, and fast food.  When eating at a restaurant, ask that your food be prepared with less salt or no salt, if  possible. What foods are recommended? The items listed may not be a complete list. Talk with your dietitian about what dietary choices are best for you. Grains Whole-grain or whole-wheat bread. Whole-grain or whole-wheat pasta. Brown rice. Orpah Cobb. Bulgur. Whole-grain and low-sodium cereals. Pita bread. Low-fat, low-sodium crackers. Whole-wheat flour tortillas. Vegetables Fresh or frozen vegetables (raw, steamed, roasted, or grilled). Low-sodium or reduced-sodium tomato and vegetable juice. Low-sodium or reduced-sodium tomato sauce and tomato paste. Low-sodium or reduced-sodium canned vegetables. Fruits All fresh, dried, or frozen fruit. Canned fruit in natural juice (without added sugar). Meat and other protein foods Skinless chicken or [place]. Ground chicken or [place]. Pork with fat trimmed off. Fish and seafood. Egg whites. Dried beans, peas, or lentils. Unsalted nuts, nut butters, and seeds. Unsalted canned beans. Lean cuts of beef with fat trimmed off. Low-sodium, lean deli meat. Dairy Low-fat (1%) or fat-free (skim) milk. Fat-free, low-fat, or reduced-fat cheeses. Nonfat, low-sodium ricotta or cottage cheese. Low-fat or nonfat yogurt. Low-fat, low-sodium cheese. Fats and oils Soft margarine without trans fats. Vegetable oil. Low-fat, reduced-fat, or light mayonnaise and salad dressings (reduced-sodium). Canola, safflower, olive, soybean, and sunflower oils. Avocado. Seasoning and other foods Herbs. Spices. Seasoning mixes without salt. Unsalted popcorn and pretzels. Fat-free sweets. What foods are not recommended? The items listed may not be a complete list. Talk with your dietitian about what dietary choices are best for you. Grains Baked goods made with fat, such as croissants, muffins, or some breads. Dry pasta or rice meal packs. Vegetables Creamed or fried vegetables. Vegetables in a cheese sauce. Regular canned vegetables (not low-sodium or reduced-sodium). Regular canned  tomato sauce and paste (not low-sodium or reduced-sodium). Regular tomato and vegetable juice (not low-sodium or reduced-sodium). Rosita Fire. Olives. Fruits Canned fruit in a light or heavy syrup. Fried fruit. Fruit in cream or butter sauce. Meat and other protein foods Fatty cuts of meat. Ribs. Fried meat. Tomasa Blase. Sausage. Bologna and other processed lunch meats. Salami. Fatback. Hotdogs. Bratwurst. Salted nuts and seeds. Canned beans with added salt. Canned or smoked fish. Whole eggs or egg yolks. Chicken or [place] with skin. Dairy Whole or 2% milk, cream, and half-and-half. Whole or full-fat cream cheese. Whole-fat or sweetened yogurt. Full-fat cheese. Nondairy creamers. Whipped toppings. Processed cheese and cheese spreads. Fats and oils Butter. Stick margarine. Lard. Shortening. Ghee. Bacon fat. Tropical oils, such as coconut, palm kernel, or palm oil. Seasoning and other foods Salted popcorn and pretzels. Onion salt, garlic salt, seasoned salt, table salt, and sea salt. Worcestershire sauce. Tartar sauce. Barbecue sauce. Teriyaki sauce. Soy sauce, including reduced-sodium. Steak sauce. Canned and packaged gravies. Fish sauce. Oyster sauce. Cocktail sauce. Horseradish that you find on the shelf. Ketchup. Mustard. Meat flavorings and tenderizers. Bouillon cubes. Hot sauce and Tabasco sauce. Premade or packaged marinades. Premade or packaged taco  seasonings. Relishes. Regular salad dressings. Where to find more information:  National Heart, Lung, and Blood Institute: PopSteam.iswww.nhlbi.nih.gov  American Heart Association: www.heart.org Summary  The DASH eating plan is a healthy eating plan that has been shown to reduce high blood pressure (hypertension). It may also reduce your risk for type 2 diabetes, heart disease, and stroke.  With the DASH eating plan, you should limit salt (sodium) intake to 2,300 mg a day. If you have hypertension, you may need to reduce your sodium intake to 1,500 mg a day.  When  on the DASH eating plan, aim to eat more fresh fruits and vegetables, whole grains, lean proteins, low-fat dairy, and heart-healthy fats.  Work with your health care provider or diet and nutrition specialist (dietitian) to adjust your eating plan to your individual calorie needs. This information is not intended to replace advice given to you by your health care provider. Make sure you discuss any questions you have with your health care provider. Document Released: 06/28/2011 Document Revised: 07/02/2016 Document Reviewed: 07/02/2016 Elsevier Interactive Patient Education  2017 [employer]

## 2017-01-03 NOTE — Assessment & Plan Note (Signed)
Slight decline with rise in A1c Add glipizide5 mg XL in AM  Increase exercise Continue metformin 2500 mg daily

## 2017-01-03 NOTE — Progress Notes (Signed)
Subjective:  Patient ID: Rhonda Miles, female    DOB: 1950-11-06  Age: 66 y.o. MRN: [id number]  CC: Diabetes   HPI Rhonda Miles presents for    1. CHRONIC DIABETES  Disease Monitoring. not checking   Polyuria: yes  Visual problems: no   Medication Compliance: yes  Medication Side Effects  Hypoglycemia: no   Preventitive Health Care  Eye Exam: yes, went May 15, 2016. Saw Dr. Katy Fitch. Due for f.u in 6 months.   Foot Exam: done recently   Diet pattern: eating low sugar, 3 meals per day   Exercise: yes   2. Elevated BP:  She denies HA, CP, SOB, tension in neck and bag, leg swelling.   Social History  Substance Use Topics  . Smoking status: Former Smoker    Quit date: 10/22/1983  . Smokeless tobacco: Never Used  . Alcohol use No    Outpatient Medications Prior to Visit  Medication Sig Dispense Refill  . atorvastatin (LIPITOR) 40 MG tablet Take 1 tablet (40 mg total) by mouth daily. 90 tablet 3  . bisacodyl (DULCOLAX) 5 MG EC tablet Take 5 mg by mouth 2 (two) times daily as needed for moderate constipation.    . Blood Glucose Monitoring Suppl (BLOOD GLUCOSE METER) kit Use as instructed 1 each 0  . cholecalciferol (VITAMIN D) 1000 UNITS tablet Take 1,000 Units by mouth daily.    . cyclobenzaprine (FLEXERIL) 10 MG tablet Take 1 tablet (10 mg total) by mouth 3 (three) times daily as needed for muscle spasms. 30 tablet 0  . glucose blood test strip Use as instructed 100 each 12  . metFORMIN (GLUCOPHAGE-XR) 500 MG 24 hr tablet TAKE 2 TABLETS BY MOUTH AFTER BREAKFAST, 1 TABLET BY MOUTH AFTER LUNCH, THEN 2 TABLETS AFTER DINNER 150 tablet 0  . naproxen (NAPROSYN) 500 MG tablet Take 1 tablet (500 mg total) by mouth 2 (two) times daily with a meal. 30 tablet 0  . traMADol (ULTRAM) 50 MG tablet Take 1 tablet (50 mg total) by mouth every 8 (eight) hours as needed. 30 tablet 0   No facility-administered medications prior to visit.     ROS Review of Systems  Constitutional:  Negative for chills, fatigue and fever.  Eyes: Negative for visual disturbance.  Respiratory: Negative for shortness of breath.   Cardiovascular: Negative for chest pain.  Gastrointestinal: Negative for abdominal pain, blood in stool and constipation.  Musculoskeletal: Positive for arthralgias. Negative for back pain.  Skin: Negative for rash.  Allergic/Immunologic: Negative for immunocompromised state.  Hematological: Negative for adenopathy. Does not bruise/bleed easily.  Psychiatric/Behavioral: Negative for dysphoric mood and suicidal ideas.    Objective:  BP (!) 148/79   Pulse 88   Temp 98.1 F (36.7 C) (Oral)   Wt 243 lb (110.2 kg)   SpO2 97%   BMI 38.06 kg/m   BP/Weight 01/03/2017 05/29/2016 4/58/0998  Systolic BP 338 250 539  Diastolic BP 79 79 70  Wt. (Lbs) 243 239 247.4  BMI 38.06 37.43 38.75    Physical Exam  Constitutional: She is oriented to person, place, and time. She appears well-developed and well-nourished. No distress.  HENT:  Head: Normocephalic and atraumatic.  Neck: Carotid bruit is not present.  Cardiovascular: Normal rate, regular rhythm, normal heart sounds and intact distal pulses.   Pulmonary/Chest: Effort normal and breath sounds normal.  Musculoskeletal: She exhibits no edema.       Left wrist: She exhibits tenderness (dorsal ulnar wrist tenderness with extension ).  She exhibits normal range of motion, no bony tenderness, no swelling, no effusion, no crepitus, no deformity and no laceration.  Neurological: She is alert and oriented to person, place, and time.  Skin: Skin is warm and dry. No rash noted.  Psychiatric: She has a normal mood and affect.   Lab Results  Component Value Date   HGBA1C 7.4 05/29/2016   Lab Results  Component Value Date   HGBA1C 7.6 01/03/2017    CBG 196   Assessment & Plan:  Rhonda Miles was seen today for diabetes.  Diagnoses and all orders for this visit:  Diabetes mellitus due to underlying condition without  complication, without long-term current use of insulin (HCC) -     HgB A1c -     Glucose (CBG) -     metFORMIN (GLUCOPHAGE-XR) 500 MG 24 hr tablet; TAKE 2 TABLETS BY MOUTH AFTER BREAKFAST, 1 TABLET BY MOUTH AFTER LUNCH, THEN 2 TABLETS AFTER DINNER -     glipiZIDE (GLIPIZIDE XL) 5 MG 24 hr tablet; Take 1 tablet (5 mg total) by mouth daily with breakfast.  Elevated BP without diagnosis of hypertension   1No orders of the defined types were placed in this encounter.   Follow-up: No Follow-up on file.   Boykin Nearing MD

## 2017-01-24 ENCOUNTER — Encounter: Payer: Self-pay | Admitting: Family Medicine

## 2017-01-24 DIAGNOSIS — E089 Diabetes mellitus due to underlying condition without complications: Secondary | ICD-10-CM

## 2017-01-24 MED ORDER — METFORMIN HCL ER 500 MG PO TB24: ORAL_TABLET | ORAL | 3 refills | Status: DC

## 2017-01-24 MED ORDER — ATORVASTATIN CALCIUM 40 MG PO TABS: 40.0000 mg | ORAL_TABLET | Freq: Every day | ORAL | 3 refills | Status: DC

## 2017-01-24 MED ORDER — GLIPIZIDE ER 5 MG PO TB24: 5.0000 mg | ORAL_TABLET | Freq: Every day | ORAL | 3 refills | Status: DC

## 2017-01-28 ENCOUNTER — Encounter: Payer: Self-pay | Admitting: Family Medicine

## 2017-01-28 DIAGNOSIS — E089 Diabetes mellitus due to underlying condition without complications: Secondary | ICD-10-CM

## 2017-01-29 MED ORDER — METFORMIN HCL ER 500 MG PO TB24: ORAL_TABLET | ORAL | 3 refills | Status: DC

## 2017-01-29 MED ORDER — ATORVASTATIN CALCIUM 40 MG PO TABS: 40.0000 mg | ORAL_TABLET | Freq: Every day | ORAL | 3 refills | Status: DC

## 2017-01-29 MED ORDER — GLIPIZIDE ER 5 MG PO TB24: 5.0000 mg | ORAL_TABLET | Freq: Every day | ORAL | 3 refills | Status: DC

## 2017-01-29 NOTE — Telephone Encounter (Signed)
Patient concern

## 2017-03-26 ENCOUNTER — Ambulatory Visit (INDEPENDENT_AMBULATORY_CARE_PROVIDER_SITE_OTHER): Payer: BLUE CROSS/BLUE SHIELD | Admitting: Internal Medicine

## 2017-03-26 ENCOUNTER — Encounter: Payer: Self-pay | Admitting: Internal Medicine

## 2017-03-26 VITALS — BP 124/78 | HR 92 | Ht 67.0 in | Wt 242.0 lb

## 2017-03-26 DIAGNOSIS — K601 Chronic anal fissure: Secondary | ICD-10-CM | POA: Diagnosis not present

## 2017-03-26 DIAGNOSIS — K641 Second degree hemorrhoids: Secondary | ICD-10-CM | POA: Diagnosis not present

## 2017-03-26 HISTORY — DX: Chronic anal fissure: K60.1

## 2017-03-26 HISTORY — DX: Second degree hemorrhoids: K64.1

## 2017-03-26 MED ORDER — AMBULATORY NON FORMULARY MEDICATION: 3 refills | Status: DC

## 2017-03-26 NOTE — Patient Instructions (Signed)
  [facility] the [facility] for you to pick up at your convenience: Diltiazem /Lidocaine rectal cream  We are giving you a handout to read on anal fissures.   Please follow up with Dr Leone PayorGessner in early November.    I appreciate the opportunity to care for you. Stan Headarl Gessner, MD, [facility]

## 2017-03-26 NOTE — Progress Notes (Signed)
[name] 66 y.o. 1951-02-28 [id number] Referred by: Estevan Ryder., PA-C  Assessment & Plan:   Encounter Diagnoses  Name Primary?  . Chronic posterior anal fissure Yes  . Prolapsed internal hemorrhoids, grade 2     As best I can tell she has a posterior anal fissure by exam. It's a little hard to see with the anoscope as she is so tender she doesn't tolerate it well. I clearly saw hemorrhoids which are think could've been bleeding but the tenderness appears to have been related to a fissure in my estimation. I'm not to treat for that with diltiazem/lidocaine cream. At least twice a day hopefully 3 times a day per rectum. Return in November for reassessment. We could consider hemorrhoidal banding at that time. There is too much spasm stenosis and tenderness to do that now. Colonoscopy in 2015 was negative. Loose frequent stools from metformin or part of the issue but that seems to be helping her diabetes. She is controlling things overall. Obviously if her stools were more formed she might have rectal pain with defecation.  I appreciate the opportunity to care for this patient. CC: Rhonda Nearing, MD Side Rhonda Macho PA-C  Subjective:   Chief Complaint:  Hemorrhoids  HPI The patient is a very nice 66 year old African-American woman I know from a colonoscopy in 2015, negative screening, who developed painful swollen thrombosed hemorrhoid seen on the left side, evaluated by primary care urgent care at [facility] on July 5. She was referred back to see me. She used hydrocortisone cream. Over time this improved. She had never had that in the past. Since that time she's had intermittent spontaneous rectal bleeding. She says that she has 2-3 soft or loose stools a day related to metformin use. These bowel movements are controllable. She does not really get pain with or after defecation. There is rare constipation. Last time she was constipated was 6 months ago. She is currently using  Preparation H passed a white.    Allergies  Allergen Reactions  . Asa [Aspirin] Other (See Comments)    shakey   Current Meds  Medication Sig  . atorvastatin (LIPITOR) 40 MG tablet Take 1 tablet (40 mg total) by mouth daily.  . bisacodyl (DULCOLAX) 5 MG EC tablet Take 5 mg by mouth 2 (two) times daily as needed for moderate constipation.  . Blood Glucose Monitoring Suppl (BLOOD GLUCOSE METER) kit Use as instructed  . cholecalciferol (VITAMIN D) 1000 UNITS tablet Take 1,000 Units by mouth daily.  Marland Kitchen glipiZIDE (GLIPIZIDE XL) 5 MG 24 hr tablet Take 1 tablet (5 mg total) by mouth daily with breakfast.  . glucose blood test strip Use as instructed  . metFORMIN (GLUCOPHAGE-XR) 500 MG 24 hr tablet TAKE 2 TABLETS BY MOUTH AFTER BREAKFAST, 1 TABLET BY MOUTH AFTER LUNCH, THEN 2 TABLETS AFTER DINNER   Past Medical History:  Diagnosis Date  . Chronic low back pain without sciatica 10/31/2015  . Diabetes mellitus without complication ([place]) Dx 0600  . GERD (gastroesophageal reflux disease) Dx 2014  . Hemorrhoids   . Hyperlipidemia    Past Surgical History:  Procedure Laterality Date  . BILATERAL SALPINGOOPHORECTOMY  11/05/2002  . COLONOSCOPY  09/2013  . LACERATION REPAIR Right 1957   ankle from lawnmower accident  . LAPAROSCOPIC ASSISTED VAGINAL HYSTERECTOMY  11/05/2002   Dr. Matthew Saras, done for menorrhagia.  Benign pathology   Social History   Social History  . Marital status: Married   Occupational History  .  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 Smoking status: Former Smoker    Quit date: 10/22/1983  . Smokeless tobacco: Never Used  . Alcohol use No  . Drug use: No  . Sexual activity: Yes    Partners: Male    Social History Narrative   Personal care assistant   Married   family history includes Breast cancer in her maternal aunt; Cancer - Other in her father; Colon cancer (age of onset: 42) in her maternal grandmother; Diabetes in her  father; Hypertension in her mother.   Review of Systems As per history of present illness.  Objective:   Physical Exam BP 124/78   Pulse 92   Ht '5\' 7"'  (1.702 m)   Wt 242 lb (109.8 kg)   BMI 37.90 kg/m  Well-developed well-nourished obese pleasant black woman in no acute distress Eyes are anicteric She has an appropriate mood and affect Abdomen is obese   Rhonda Miles, Rhonda Miles present.  Rectal:  NL anoderm Tender exam w/ spasm ++ posterior tenderness  0.125% NTG and 5% lidocaine applied   Anoscopy was performed with the patient in the left lateral decubitus position while a chaperone was present and revealed Gr 2 inflamed internal hemorrhoids, and suspect posterior fissure

## 2017-04-05 ENCOUNTER — Ambulatory Visit: Payer: BLUE CROSS/BLUE SHIELD | Admitting: Internal Medicine

## 2017-05-14 ENCOUNTER — Encounter: Payer: Self-pay | Admitting: Family Medicine

## 2017-05-14 LAB — HM DIABETES EYE EXAM

## 2017-05-23 ENCOUNTER — Ambulatory Visit: Payer: BLUE CROSS/BLUE SHIELD | Admitting: Internal Medicine

## 2017-06-04 ENCOUNTER — Encounter: Payer: Self-pay | Admitting: Internal Medicine

## 2017-06-04 ENCOUNTER — Ambulatory Visit: Payer: BLUE CROSS/BLUE SHIELD | Admitting: Internal Medicine

## 2017-06-04 DIAGNOSIS — K601 Chronic anal fissure: Secondary | ICD-10-CM | POA: Diagnosis not present

## 2017-06-04 NOTE — Assessment & Plan Note (Signed)
Much better Take diltiazem/lidocaine  for 1 month after well RTC prn

## 2017-06-04 NOTE — Patient Instructions (Addendum)
  Glad your much improved.   Continue to use your rectal cream for a month after you feel better.   Follow up with us as needed.    I appreciate the opportunity to care for you. Stan Headarl Gessner, MD, [facility]AC[facility]

## 2017-06-04 NOTE — Progress Notes (Signed)
   Rhonda Miles 66 y.o. 20-Jan-1951 [id number]  Assessment & Plan:  Chronic posterior anal fissure Much better Take diltiazem/lidocaine  for 1 month after well RTC prn      Subjective:   Chief Complaint:  HPI The patient reports that she is having much less pain and bleeding since treating her anal fissure with diltiazem and lidocaine topical.  I had seen her about 2-1/2 months ago.  She is not having problems every day and overall feels much better.  3 soft to formed bowel movements a day.  No significant straining reported. Allergies  Allergen Reactions  . Asa [Aspirin] Other (See Comments)    shakey   Current Meds  Medication Sig  . AMBULATORY NON FORMULARY MEDICATION Diltiazem 2% gel mixed with the Lidocaine 5% Apply pea size amount inside rectum three times a day  . atorvastatin (LIPITOR) 40 MG tablet Take 1 tablet (40 mg total) by mouth daily.  . bisacodyl (DULCOLAX) 5 MG EC tablet Take 5 mg by mouth 2 (two) times daily as needed for moderate constipation.  . Blood Glucose Monitoring Suppl (BLOOD GLUCOSE METER) kit Use as instructed  . cholecalciferol (VITAMIN D) 1000 UNITS tablet Take 1,000 Units by mouth daily.  Marland Kitchen glipiZIDE (GLIPIZIDE XL) 5 MG 24 hr tablet Take 1 tablet (5 mg total) by mouth daily with breakfast.  . glucose blood test strip Use as instructed  . metFORMIN (GLUCOPHAGE-XR) 500 MG 24 hr tablet TAKE 2 TABLETS BY MOUTH AFTER BREAKFAST, 1 TABLET BY MOUTH AFTER LUNCH, THEN 2 TABLETS AFTER DINNER   Past Medical History:  Diagnosis Date  . Chronic low back pain without sciatica 10/31/2015  . Chronic posterior anal fissure 03/26/2017  . Diabetes mellitus without complication ([place]) Dx 3943  . GERD (gastroesophageal reflux disease) Dx 2014  . Hemorrhoids   . Hyperlipidemia   . Prolapsed internal hemorrhoids, grade 2 03/26/2017   All 3 positions at anoscopy   Past Surgical History:  Procedure Laterality Date  . BILATERAL SALPINGOOPHORECTOMY  11/05/2002  .  COLONOSCOPY  09/2013  . LACERATION REPAIR Right 1957   ankle from lawnmower accident  . LAPAROSCOPIC ASSISTED VAGINAL HYSTERECTOMY  11/05/2002   Dr. Matthew Saras, done for menorrhagia.  Benign pathology   Review of Systems Per HPI  Objective:   Physical Exam BP (!) 146/70   Pulse 84   Ht _0  (1.702 m)   Wt 248 lb (112.5 kg)   BMI 38.84 kg/m  Obese pleasant black woman no acute distress  Rhonda Miles, CMA present.  Rectal exam shows a tiny posterior sentinel pile, digital exam with the index finger is not tender at all today there is slight induration in the posterior canal but this is significantly better than exam 2.5 months ago.

## 2017-06-07 ENCOUNTER — Encounter: Payer: Self-pay | Admitting: Internal Medicine

## 2017-06-07 ENCOUNTER — Other Ambulatory Visit: Payer: Self-pay

## 2017-06-07 ENCOUNTER — Ambulatory Visit: Payer: BLUE CROSS/BLUE SHIELD | Attending: Internal Medicine | Admitting: Internal Medicine

## 2017-06-07 VITALS — BP 138/80 | HR 83 | Temp 98.3°F | Resp 20 | Wt 246.6 lb

## 2017-06-07 DIAGNOSIS — Z90710 Acquired absence of both cervix and uterus: Secondary | ICD-10-CM | POA: Insufficient documentation

## 2017-06-07 DIAGNOSIS — Z833 Family history of diabetes mellitus: Secondary | ICD-10-CM | POA: Diagnosis not present

## 2017-06-07 DIAGNOSIS — Z886 Allergy status to analgesic agent status: Secondary | ICD-10-CM | POA: Insufficient documentation

## 2017-06-07 DIAGNOSIS — Z23 Encounter for immunization: Secondary | ICD-10-CM | POA: Diagnosis not present

## 2017-06-07 DIAGNOSIS — Z6838 Body mass index (BMI) 38.0-38.9, adult: Secondary | ICD-10-CM | POA: Diagnosis not present

## 2017-06-07 DIAGNOSIS — Z7984 Long term (current) use of oral hypoglycemic drugs: Secondary | ICD-10-CM | POA: Insufficient documentation

## 2017-06-07 DIAGNOSIS — Z8249 Family history of ischemic heart disease and other diseases of the circulatory system: Secondary | ICD-10-CM | POA: Diagnosis not present

## 2017-06-07 DIAGNOSIS — E785 Hyperlipidemia, unspecified: Secondary | ICD-10-CM | POA: Insufficient documentation

## 2017-06-07 DIAGNOSIS — Z87891 Personal history of nicotine dependence: Secondary | ICD-10-CM | POA: Diagnosis not present

## 2017-06-07 DIAGNOSIS — E119 Type 2 diabetes mellitus without complications: Secondary | ICD-10-CM | POA: Diagnosis not present

## 2017-06-07 DIAGNOSIS — I10 Essential (primary) hypertension: Secondary | ICD-10-CM | POA: Diagnosis not present

## 2017-06-07 DIAGNOSIS — Z79899 Other long term (current) drug therapy: Secondary | ICD-10-CM | POA: Insufficient documentation

## 2017-06-07 LAB — GLUCOSE, POCT (MANUAL RESULT ENTRY): POC GLUCOSE: 73 mg/dL (ref 70–99)

## 2017-06-07 LAB — POCT GLYCOSYLATED HEMOGLOBIN (HGB A1C): HEMOGLOBIN A1C: 6.9

## 2017-06-07 MED ORDER — LISINOPRIL 5 MG PO TABS: 5.0000 mg | ORAL_TABLET | Freq: Every day | ORAL | 3 refills | Status: DC

## 2017-06-07 NOTE — Progress Notes (Signed)
Follow up for DM and meds Flu shot

## 2017-06-07 NOTE — Patient Instructions (Signed)
Stop glipizide.  Continue metformin.  Check your blood sugars daily in the mornings for the next 2 weeks.  Goal is to keep your blood sugars between 90-130 in the mornings.  Check your feet regularly.   Your blood pressure is elevated.  We have started you on a medication called lisinopril to take daily.  Try to limit the salt in the foods.  Try to check your blood pressure at least once a month.  Goal is to be 140/90 or lower.

## 2017-06-07 NOTE — Progress Notes (Signed)
Patient ID: Rhonda Miles, female    DOB: 02/24/1951  MRN: [id number]  CC: No chief complaint on file.   Subjective: Rhonda Miles is a 66 y.o. female who presents for chronic disease management. Her concerns today include:  Patient with history of DM type II, HL, obesity, elevated blood pressure without diagnosis of HTN  1. DM -checking BS 2-3 x a wk. Range 80-90s. In 60s few times. She feels nervous when BS is low; can occur even after she has eaten. BS 73 today in clinic. She had BF but no lunch as yet. -Eating habits: I always tried to watch my eating patterns Exercise:  Active on her job and at [employer] exam: done last mth with Rhonda Miles. Laser surgery BL - glaucoma Some persistent numbness RT lateral foot x 1 mth. Had surgery on this foot >30 yrs ago  2. HL: Tolerating Lipitor.  3. BP: does not check BP.  No chest pains or shortness of breath at rest on exertion.  No lower extremity edema.  Patient Active Problem List   Diagnosis Date Noted  . Chronic posterior anal fissure 03/26/2017  . Prolapsed internal hemorrhoids, grade 2 03/26/2017  . Elevated BP without diagnosis of hypertension 01/03/2017  . Glaucoma (increased eye pressure) 07/12/2015  . Snoring 07/12/2015  . Bothersome menopausal vasomotor symptoms 06/03/2014  . Diabetes mellitus due to underlying condition without complications ([place]) 40/98/1191  . Obesity 02/13/2013     Current Outpatient Medications on File Prior to Visit  Medication Sig Dispense Refill  . atorvastatin (LIPITOR) 40 MG tablet Take 1 tablet (40 mg total) by mouth daily. 90 tablet 3  . bisacodyl (DULCOLAX) 5 MG EC tablet Take 5 mg by mouth 2 (two) times daily as needed for moderate constipation.    . Blood Glucose Monitoring Suppl (BLOOD GLUCOSE METER) kit Use as instructed 1 each 0  . cholecalciferol (VITAMIN D) 1000 UNITS tablet Take 1,000 Units by mouth daily.    Marland Kitchen glipiZIDE (GLIPIZIDE XL) 5 MG 24 hr tablet Take 1 tablet (5 mg total)  by mouth daily with breakfast. 90 tablet 3  . metFORMIN (GLUCOPHAGE-XR) 500 MG 24 hr tablet TAKE 2 TABLETS BY MOUTH AFTER BREAKFAST, 1 TABLET BY MOUTH AFTER LUNCH, THEN 2 TABLETS AFTER DINNER 450 tablet 3  . AMBULATORY NON FORMULARY MEDICATION Diltiazem 2% gel mixed with the Lidocaine 5% Apply pea size amount inside rectum three times a day 30 g 3  . glucose blood test strip Use as instructed 100 each 12   No current facility-administered medications on file prior to visit.     Allergies  Allergen Reactions  . Asa [Aspirin] Other (See Comments)    shakey    Social History   Socioeconomic History  . Marital status: Married    Spouse name: Not on file  . Number of children: Not on file  . Years of education: Not on file  . Highest education level: Not on file  Social Needs  . Financial resource strain: Not on file  . Food insecurity - worry: Not on file  . Food insecurity - inability: Not on file  . Transportation needs - medical: Not on file  . Transportation needs - non-medical: Not on file  Occupational History  . Occupation: Physiological scientist: [employer]  Tobacco Use  . Smoking status: Former Smoker    Last attempt to quit: 10/22/1983    Years since quitting: 33.6  . Smokeless  tobacco: Never Used  Substance and Sexual Activity  . Alcohol use: No  . Drug use: No  . Sexual activity: Yes    Partners: Male  Other Topics Concern  . Not on file  Social History Narrative   Personal care assistant   Married    Family History  Problem Relation Age of Onset  . Diabetes Father   . Cancer - Other Father        FROM CHEMICAL EXPOSURE  . Hypertension Mother   . Colon cancer Maternal Grandmother 69  . Breast cancer Maternal Aunt   . Stomach cancer Neg Hx   . Pancreatic cancer Neg Hx     Past Surgical History:  Procedure Laterality Date  . BILATERAL SALPINGOOPHORECTOMY  11/05/2002  . COLONOSCOPY  09/2013  . LACERATION REPAIR Right 1957    ankle from lawnmower accident  . LAPAROSCOPIC ASSISTED VAGINAL HYSTERECTOMY  11/05/2002   Rhonda Miles, done for menorrhagia.  Benign pathology    ROS: Review of Systems Neg except as stated above PHYSICAL EXAM: BP (!) 148/84 (BP Location: Right Arm, Patient Position: Sitting, Cuff Size: Large)   Pulse 83   Temp 98.3 F (36.8 C) (Oral)   Resp 20   Wt 246 lb 9.6 oz (111.9 kg)   SpO2 99%   BMI 38.62 kg/m   Wt Readings from Last 3 Encounters:  06/07/17 246 lb 9.6 oz (111.9 kg)  06/04/17 248 lb (112.5 kg)  03/26/17 242 lb (109.8 kg)    Repeat is BP 150/80 Physical Exam  General appearance - alert, well appearing, and in no distress Mental status - alert, oriented to person, place, and time, normal mood, behavior, speech, dress, motor activity, and thought processes Mouth - mucous membranes moist, pharynx normal without lesions Neck - supple, no significant adenopathy Chest - clear to auscultation, no wheezes, rales or rhonchi, symmetric air entry Heart - normal rate, regular rhythm, normal S1, S2, no murmurs, rubs, clicks or gallops Extremities - peripheral pulses normal, no pedal edema, no clubbing or cyanosis Diabetic Foot Exam - Simple   Simple Foot Form Visual Inspection No deformities, no ulcerations, no other skin breakdown bilaterally:  Yes Sensation Testing See comments:  Yes Pulse Check Posterior Tibialis and Dorsalis pulse intact bilaterally:  Yes Comments Abnormal LEAP along lateral edge of RT foot      Results for orders placed or performed in visit on 06/07/17  Glucose (CBG)  Result Value Ref Range   POC Glucose 73 70 - 99 mg/dl   Lab Results  Component Value Date   HGBA1C 6.9 06/07/2017    ASSESSMENT AND PLAN: 1. Controlled type 2 diabetes mellitus without complication, without long-term current use of insulin ([place]) At goal.  However given hypoglycemic episodes I recommend stopping glipizide and continuing metformin.  Encouraged to eat meals on  time. Blood sugar low in clinic today.  Patient given a small can of soda on a pack of crackers. Encouraged her to always take a small snack with her. - Glucose (CBG) - HgB A1c - Comprehensive metabolic panel - Lipid panel - CBC - Microalbumin / creatinine urine ratio  2. Need for influenza vaccination - Flu Vaccine QUAD 6+ mos PF IM (Fluarix Quad PF)  3. Essential hypertension We decided to go ahead and start her on antihypertensive today. DASH diet and encouraged - lisinopril (PRINIVIL,ZESTRIL) 5 MG tablet; Take 1 tablet (5 mg total) daily by mouth.  Dispense: 90 tablet; Refill: 3  4. Class 2 severe obesity  with serious comorbidity and body mass index (BMI) of 38.0 to 38.9 in adult, unspecified obesity type [facility]) Encouraged her to continue healthy eating habits and try to stay active.  5. Hyperlipidemia, unspecified hyperlipidemia type Cont Lipitor   Patient was given the opportunity to ask questions.  Patient verbalized understanding of the plan and was able to repeat key elements of the plan.   Orders Placed This Encounter  Procedures  . Flu Vaccine QUAD 6+ mos PF IM (Fluarix Quad PF)  . Glucose (CBG)  . HgB A1c     Requested Prescriptions    No prescriptions requested or ordered in this encounter    Follow-up in 3 months Karle Plumber, MD, [facility]

## 2017-06-08 LAB — CBC
HEMATOCRIT: 37.8 % (ref 34.0–46.6)
HEMOGLOBIN: 11.7 g/dL (ref 11.1–15.9)
MCH: 23.9 pg — ABNORMAL LOW (ref 26.6–33.0)
MCHC: 31 g/dL — AB (ref 31.5–35.7)
MCV: 77 fL — AB (ref 79–97)
Platelets: 393 10*3/uL — ABNORMAL HIGH (ref 150–379)
RBC: 4.89 x10E6/uL (ref 3.77–5.28)
RDW: 16.2 % — ABNORMAL HIGH (ref 12.3–15.4)
WBC: 6 10*3/uL (ref 3.4–10.8)

## 2017-06-08 LAB — COMPREHENSIVE METABOLIC PANEL
A/G RATIO: 1.2 (ref 1.2–2.2)
ALK PHOS: 85 IU/L (ref 39–117)
ALT: 13 IU/L (ref 0–32)
AST: 14 IU/L (ref 0–40)
Albumin: 4.2 g/dL (ref 3.6–4.8)
BILIRUBIN TOTAL: 0.5 mg/dL (ref 0.0–1.2)
BUN/Creatinine Ratio: 20 (ref 12–28)
BUN: 14 mg/dL (ref 8–27)
CALCIUM: 9.6 mg/dL (ref 8.7–10.3)
CHLORIDE: 100 mmol/L (ref 96–106)
CO2: 24 mmol/L (ref 20–29)
Creatinine, Ser: 0.69 mg/dL (ref 0.57–1.00)
GFR calc Af Amer: 106 mL/min/{1.73_m2} (ref >59)
GFR, EST NON AFRICAN AMERICAN: 92 mL/min/{1.73_m2} (ref >59)
GLOBULIN, TOTAL: 3.4 g/dL (ref 1.5–4.5)
Glucose: 96 mg/dL (ref 65–99)
POTASSIUM: 4.2 mmol/L (ref 3.5–5.2)
SODIUM: 140 mmol/L (ref 134–144)
Total Protein: 7.6 g/dL (ref 6.0–8.5)

## 2017-06-08 LAB — LIPID PANEL
CHOL/HDL RATIO: 2.7 ratio (ref 0.0–4.4)
Cholesterol, Total: 107 mg/dL (ref 100–199)
HDL: 39 mg/dL — AB (ref >39)
LDL Calculated: 53 mg/dL (ref 0–99)
TRIGLYCERIDES: 74 mg/dL (ref 0–149)
VLDL Cholesterol Cal: 15 mg/dL (ref 5–40)

## 2017-06-08 LAB — MICROALBUMIN / CREATININE URINE RATIO
Creatinine, Urine: 203.6 mg/dL
MICROALB/CREAT RATIO: 6.6 mg/g{creat} (ref 0.0–30.0)
MICROALBUM., U, RANDOM: 13.5 ug/mL

## 2017-09-06 ENCOUNTER — Ambulatory Visit: Payer: BLUE CROSS/BLUE SHIELD | Attending: Internal Medicine | Admitting: Internal Medicine

## 2017-09-06 ENCOUNTER — Encounter: Payer: Self-pay | Admitting: Internal Medicine

## 2017-09-06 VITALS — BP 117/79 | HR 85 | Temp 98.7°F | Resp 16 | Wt 249.8 lb

## 2017-09-06 DIAGNOSIS — Z7984 Long term (current) use of oral hypoglycemic drugs: Secondary | ICD-10-CM | POA: Diagnosis not present

## 2017-09-06 DIAGNOSIS — D509 Iron deficiency anemia, unspecified: Secondary | ICD-10-CM | POA: Diagnosis not present

## 2017-09-06 DIAGNOSIS — Z79899 Other long term (current) drug therapy: Secondary | ICD-10-CM | POA: Diagnosis not present

## 2017-09-06 DIAGNOSIS — E669 Obesity, unspecified: Secondary | ICD-10-CM | POA: Insufficient documentation

## 2017-09-06 DIAGNOSIS — I10 Essential (primary) hypertension: Secondary | ICD-10-CM | POA: Diagnosis not present

## 2017-09-06 DIAGNOSIS — E785 Hyperlipidemia, unspecified: Secondary | ICD-10-CM

## 2017-09-06 DIAGNOSIS — Z87891 Personal history of nicotine dependence: Secondary | ICD-10-CM | POA: Diagnosis not present

## 2017-09-06 DIAGNOSIS — E119 Type 2 diabetes mellitus without complications: Secondary | ICD-10-CM | POA: Diagnosis not present

## 2017-09-06 DIAGNOSIS — H409 Unspecified glaucoma: Secondary | ICD-10-CM | POA: Insufficient documentation

## 2017-09-06 DIAGNOSIS — K648 Other hemorrhoids: Secondary | ICD-10-CM | POA: Insufficient documentation

## 2017-09-06 LAB — GLUCOSE, POCT (MANUAL RESULT ENTRY): POC GLUCOSE: 128 mg/dL — AB (ref 70–99)

## 2017-09-06 NOTE — Patient Instructions (Signed)
Continue to work on eating habits.   Continue current medications.

## 2017-09-06 NOTE — Progress Notes (Signed)
Patient ID: Rhonda Miles, female    DOB: 04-08-1951  MRN: [id number]  CC: Diabetes and Hypertension   Subjective: Rhonda Miles is a 67 y.o. female who presents for chronic ds management. Her concerns today include:  Patient with history of DM type II, HL, obesity, elevated blood pressure without diagnosis of HTN  1.  DM:  Meds: Glipizide stopped on last visit due to hypoglycemia.  Metformin was continued.  Compliant with Metformin Checking BS once a day.   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   Eating Habits:  Doing better.  Cooking more and eating less fried foods  2.  HTTN:  Started on Lisinopril on last visit No CP/SOB/LE edema  3.  I went over blood work from last visit.  Hemoglobin is in normal range but has been steadily trending down over the past several years she is up-to-date with colon cancer screening.. No blood in stools.  No fatigue or dizziness. Patient Active Problem List   Diagnosis Date Noted  . Essential hypertension 06/07/2017  . Chronic posterior anal fissure 03/26/2017  . Prolapsed internal hemorrhoids, grade 2 03/26/2017  . Glaucoma (increased eye pressure) 07/12/2015  . Snoring 07/12/2015  . Bothersome menopausal vasomotor symptoms 06/03/2014  . Diabetes mellitus due to underlying condition without complications ([place]) 28/36/6294  . Obesity 02/13/2013     Current Outpatient Medications on File Prior to Visit  Medication Sig Dispense Refill  . AMBULATORY NON FORMULARY MEDICATION Diltiazem 2% gel mixed with the Lidocaine 5% Apply pea size amount inside rectum three times a day 30 g 3  . atorvastatin (LIPITOR) 40 MG tablet Take 1 tablet (40 mg total) by mouth daily. 90 tablet 3  . bisacodyl (DULCOLAX) 5 MG EC tablet Take 5 mg by mouth 2 (two) times daily as needed for moderate constipation.    . Blood Glucose Monitoring Suppl (BLOOD GLUCOSE METER) kit Use as instructed 1 each 0  .  cholecalciferol (VITAMIN D) 1000 UNITS tablet Take 1,000 Units by mouth daily.    Marland Kitchen glucose blood test strip Use as instructed 100 each 12  . lisinopril (PRINIVIL,ZESTRIL) 5 MG tablet Take 1 tablet (5 mg total) daily by mouth. 90 tablet 3  . metFORMIN (GLUCOPHAGE-XR) 500 MG 24 hr tablet TAKE 2 TABLETS BY MOUTH AFTER BREAKFAST, 1 TABLET BY MOUTH AFTER LUNCH, THEN 2 TABLETS AFTER DINNER 450 tablet 3   No current facility-administered medications on file prior to visit.     Allergies  Allergen Reactions  . Asa [Aspirin] Other (See Comments)    shakey    Social History   Socioeconomic History  . Marital status: Married    Spouse name: Not on file  . Number of children: Not on file  . Years of education: Not on file  . Highest education level: Not on file  Social Needs  . Financial resource strain: Not on file  . Food insecurity - worry: Not on file  . Food insecurity - inability: Not on file  . Transportation needs - medical: Not on file  . Transportation needs - non-medical: Not on file  Occupational History  . Occupation: Physiological scientist: [employer]  Tobacco Use  . Smoking status: Former Smoker    Last attempt to quit: 10/22/1983    Years since quitting: 33.8  . Smokeless tobacco: Never Used  Substance and Sexual Activity  . Alcohol use:  No  . Drug use: No  . Sexual activity: Yes    Partners: Male  Other Topics Concern  . Not on file  Social History Narrative   Personal care assistant   Married    Family History  Problem Relation Age of Onset  . Diabetes Father   . Cancer - Other Father        FROM CHEMICAL EXPOSURE  . Hypertension Mother   . Colon cancer Maternal Grandmother 59  . Breast cancer Maternal Aunt   . Stomach cancer Neg Hx   . Pancreatic cancer Neg Hx     Past Surgical History:  Procedure Laterality Date  . BILATERAL SALPINGOOPHORECTOMY  11/05/2002  . COLONOSCOPY  09/2013  . LACERATION REPAIR Right 1957   ankle  from lawnmower accident  . LAPAROSCOPIC ASSISTED VAGINAL HYSTERECTOMY  11/05/2002   Dr. Matthew Miles, done for menorrhagia.  Benign pathology    ROS: Review of Systems Negative except as stated above. PHYSICAL EXAM: BP 117/79   Pulse 85   Temp 98.7 F (37.1 C) (Oral)   Resp 16   Wt 249 lb 12.8 oz (113.3 kg)   SpO2 98%   BMI 39.12 kg/m   Wt Readings from Last 3 Encounters:  09/06/17 249 lb 12.8 oz (113.3 kg)  06/07/17 246 lb 9.6 oz (111.9 kg)  06/04/17 248 lb (112.5 kg)   Physical Exam  General appearance - alert, well appearing, and in no distress Mental status - alert, oriented to person, place, and time, normal mood, behavior, speech, dress, motor activity, and thought processes Neck - supple, no significant adenopathy Chest - clear to auscultation, no wheezes, rales or rhonchi, symmetric air entry Heart - normal rate, regular rhythm, normal S1, S2, no murmurs, rubs, clicks or gallops Extremities - peripheral pulses normal, no pedal edema, no clubbing or cyanosis  Results for orders placed or performed in visit on 09/06/17  POCT glucose (manual entry)  Result Value Ref Range   POC Glucose 128 (A) 70 - 99 mg/dl    ASSESSMENT AND PLAN: 1. Controlled type 2 diabetes mellitus without complication, without long-term current use of insulin ([place]) -Commended her on trying to change her eating habits.  Continue metformin. - POCT glucose (manual entry) - Hemoglobin A1c  2. Essential hypertension At goal.  Continue lisinopril.  3. Microcytic anemia - Iron, TIBC and Ferritin Panel  4. Hyperlipidemia, unspecified hyperlipidemia type Tolerating Lipitor   Patient was given the opportunity to ask questions.  Patient verbalized understanding of the plan and was able to repeat key elements of the plan.   Orders Placed This Encounter  Procedures  . Iron, TIBC and Ferritin Panel  . Hemoglobin A1c  . POCT glucose (manual entry)     Requested Prescriptions    No prescriptions  requested or ordered in this encounter    Return in about 4 months (around 01/04/2018).  Karle Plumber, MD, FACP

## 2017-09-07 LAB — IRON,TIBC AND FERRITIN PANEL
Ferritin: 28 ng/mL (ref 15–150)
IRON SATURATION: 11 % — AB (ref 15–55)
IRON: 39 ug/dL (ref 27–139)
Total Iron Binding Capacity: 361 ug/dL (ref 250–450)
UIBC: 322 ug/dL (ref 118–369)

## 2017-09-07 LAB — HEMOGLOBIN A1C
Est. average glucose Bld gHb Est-mCnc: 192 mg/dL
Hgb A1c MFr Bld: 8.3 % — ABNORMAL HIGH (ref 4.8–5.6)

## 2017-09-08 ENCOUNTER — Telehealth: Payer: Self-pay | Admitting: Internal Medicine

## 2017-09-08 MED ORDER — FERROUS SULFATE 325 (65 FE) MG PO TABS: 325.0000 mg | ORAL_TABLET | Freq: Every day | ORAL | 0 refills | Status: DC

## 2017-09-08 MED ORDER — GLIMEPIRIDE 2 MG PO TABS: 2.0000 mg | ORAL_TABLET | Freq: Every day | ORAL | 6 refills | Status: DC

## 2017-09-08 NOTE — Telephone Encounter (Signed)
PC placed to pt this a.m.  Pt informed that A1C 8.3 which is elevated compared to last visit.  I recommend continue Metformin and add Amaryl.  Advised to check BS daily for net 2-3 wks.   Iron study c/w IDA.  I recommend starting iron supplement.  Rxn to be sent to her pharmacy.  Pt had c-scope 2015. Pt expressed understaning of plan.

## 2017-11-11 ENCOUNTER — Other Ambulatory Visit: Payer: Self-pay | Admitting: Internal Medicine

## 2017-11-11 DIAGNOSIS — Z1231 Encounter for screening mammogram for malignant neoplasm of breast: Secondary | ICD-10-CM

## 2017-12-02 ENCOUNTER — Ambulatory Visit
Admission: RE | Admit: 2017-12-02 | Discharge: 2017-12-02 | Disposition: A | Discharge status: Home / Self Care | Payer: BLUE CROSS/BLUE SHIELD | Source: Ambulatory Visit | Attending: Internal Medicine | Admitting: Internal Medicine

## 2017-12-02 DIAGNOSIS — Z1231 Encounter for screening mammogram for malignant neoplasm of breast: Secondary | ICD-10-CM

## 2017-12-06 ENCOUNTER — Other Ambulatory Visit: Payer: Self-pay | Admitting: Internal Medicine

## 2017-12-23 ENCOUNTER — Encounter: Payer: Self-pay | Admitting: Internal Medicine

## 2017-12-23 ENCOUNTER — Ambulatory Visit: Payer: Medicare Other | Attending: Internal Medicine | Admitting: Internal Medicine

## 2017-12-23 VITALS — BP 131/76 | HR 88 | Temp 98.4°F | Resp 16 | Wt 242.8 lb

## 2017-12-23 DIAGNOSIS — I10 Essential (primary) hypertension: Secondary | ICD-10-CM | POA: Insufficient documentation

## 2017-12-23 DIAGNOSIS — Z886 Allergy status to analgesic agent status: Secondary | ICD-10-CM | POA: Diagnosis not present

## 2017-12-23 DIAGNOSIS — Z9889 Other specified postprocedural states: Secondary | ICD-10-CM | POA: Insufficient documentation

## 2017-12-23 DIAGNOSIS — Z833 Family history of diabetes mellitus: Secondary | ICD-10-CM | POA: Diagnosis not present

## 2017-12-23 DIAGNOSIS — Z87891 Personal history of nicotine dependence: Secondary | ICD-10-CM | POA: Insufficient documentation

## 2017-12-23 DIAGNOSIS — Z8249 Family history of ischemic heart disease and other diseases of the circulatory system: Secondary | ICD-10-CM | POA: Insufficient documentation

## 2017-12-23 DIAGNOSIS — Z6830 Body mass index (BMI) 30.0-30.9, adult: Secondary | ICD-10-CM | POA: Insufficient documentation

## 2017-12-23 DIAGNOSIS — E119 Type 2 diabetes mellitus without complications: Secondary | ICD-10-CM

## 2017-12-23 DIAGNOSIS — Z7984 Long term (current) use of oral hypoglycemic drugs: Secondary | ICD-10-CM | POA: Diagnosis not present

## 2017-12-23 DIAGNOSIS — Z90710 Acquired absence of both cervix and uterus: Secondary | ICD-10-CM | POA: Insufficient documentation

## 2017-12-23 DIAGNOSIS — E785 Hyperlipidemia, unspecified: Secondary | ICD-10-CM | POA: Diagnosis not present

## 2017-12-23 DIAGNOSIS — E089 Diabetes mellitus due to underlying condition without complications: Secondary | ICD-10-CM | POA: Diagnosis not present

## 2017-12-23 DIAGNOSIS — Z800 Family history of malignant neoplasm of digestive organs: Secondary | ICD-10-CM | POA: Diagnosis not present

## 2017-12-23 DIAGNOSIS — D509 Iron deficiency anemia, unspecified: Secondary | ICD-10-CM | POA: Insufficient documentation

## 2017-12-23 DIAGNOSIS — E669 Obesity, unspecified: Secondary | ICD-10-CM | POA: Insufficient documentation

## 2017-12-23 LAB — POCT GLYCOSYLATED HEMOGLOBIN (HGB A1C): HbA1c, POC (controlled diabetic range): 6.6 % (ref 0.0–7.0)

## 2017-12-23 LAB — GLUCOSE, POCT (MANUAL RESULT ENTRY): POC Glucose: 198 mg/dl — AB (ref 70–99)

## 2017-12-23 MED ORDER — ATORVASTATIN CALCIUM 40 MG PO TABS: 40.0000 mg | ORAL_TABLET | Freq: Every day | ORAL | 3 refills | Status: DC

## 2017-12-23 MED ORDER — METFORMIN HCL ER 500 MG PO TB24: ORAL_TABLET | ORAL | 3 refills | Status: DC

## 2017-12-23 NOTE — Progress Notes (Signed)
Patient ID: Rhonda Miles, female    DOB: 1950/07/25  MRN: [id number]  CC: Diabetes; Hypertension; and Headache   Subjective: Rhonda Miles is a 67 y.o. female who presents for chronic ds management.  Last seen 08/2017 Her concerns today include:  Patient with history of DM type II, HL, IDA, obesity, elevated blood pressure without diagnosis of HTN  DM/obesity: checks BS every a.m.  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 Eating habits:  "I don't eat as much."  Loss 7 lbs since last visit with me.  She is down a few clothes size Meds:  Compliant with Metformin and Amaryl  HTN:  Has a slight HA today.  Attributes this to stress of dealing with a client today.  No CP/SOB/LE edema Limits salt in foods  Anemia:  Taking the iron supplement as prescribed  HL:  Compliant with Lipitor.  No muscle cramps or aches  Patient Active Problem List   Diagnosis Date Noted  . Essential hypertension 06/07/2017  . Chronic posterior anal fissure 03/26/2017  . Prolapsed internal hemorrhoids, grade 2 03/26/2017  . Glaucoma (increased eye pressure) 07/12/2015  . Snoring 07/12/2015  . Bothersome menopausal vasomotor symptoms 06/03/2014  . Diabetes mellitus due to underlying condition without complications ([place]) 74/82/7078  . Obesity 02/13/2013     Current Outpatient Medications on File Prior to Visit  Medication Sig Dispense Refill  . AMBULATORY NON FORMULARY MEDICATION Diltiazem 2% gel mixed with the Lidocaine 5% Apply pea size amount inside rectum three times a day 30 g 3  . bisacodyl (DULCOLAX) 5 MG EC tablet Take 5 mg by mouth 2 (two) times daily as needed for moderate constipation.    . Blood Glucose Monitoring Suppl (BLOOD GLUCOSE METER) kit Use as instructed 1 each 0  . cholecalciferol (VITAMIN D) 1000 UNITS tablet Take 1,000 Units by mouth daily.    . Ferrous Sulfate (IRON) 325 (65 Fe) MG TABS TAKE 1 TABLET BY MOUTH ONCE DAILY WITH  BREAKFAST 90 tablet 0  . glimepiride (AMARYL) 2 MG tablet Take 1 tablet (2 mg total) by mouth daily before breakfast. 30 tablet 6  . glucose blood test strip Use as instructed 100 each 12  . lisinopril (PRINIVIL,ZESTRIL) 5 MG tablet Take 1 tablet (5 mg total) daily by mouth. 90 tablet 3   No current facility-administered medications on file prior to visit.     Allergies  Allergen Reactions  . Asa [Aspirin] Other (See Comments)    shakey    Social History   Socioeconomic History  . Marital status: Married    Spouse name: Not on file  . Number of children: Not on file  . Years of education: Not on file  . Highest education level: Not on file  Occupational History  . 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 Financial resource strain: Not on file  . Food insecurity:    Worry: Not on file    Inability: Not on file  . Transportation needs:    Medical: Not on file    Non-medical: Not on file  Tobacco Use  . Smoking status: Former Smoker    Last attempt to quit: 10/22/1983    Years since quitting: 34.1  . Smokeless tobacco: Never Used  Substance and Sexual Activity  . Alcohol use: No  . Drug use: No  . Sexual activity: Yes    Partners:  Male  Lifestyle  . Physical activity:    Days per week: Not on file    Minutes per session: Not on file  . Stress: Not on file  Relationships  . Social connections:    Talks on phone: Not on file    Gets together: Not on file    Attends religious service: Not on file    Active member of club or organization: Not on file    Attends meetings of clubs or organizations: Not on file    Relationship status: Not on file  . Intimate partner violence:    Fear of current or ex partner: Not on file    Emotionally abused: Not on file    Physically abused: Not on file    Forced sexual activity: Not on file  Other Topics Concern  . Not on file  Social History Narrative   Personal care assistant    Married    Family History  Problem Relation Age of Onset  . Diabetes Father   . Cancer - Other Father        FROM CHEMICAL EXPOSURE  . Hypertension Mother   . Colon cancer Maternal Grandmother 24  . Breast cancer Maternal Aunt   . Stomach cancer Neg Hx   . Pancreatic cancer Neg Hx     Past Surgical History:  Procedure Laterality Date  . BILATERAL SALPINGOOPHORECTOMY  11/05/2002  . COLONOSCOPY  09/2013  . LACERATION REPAIR Right 1957   ankle from lawnmower accident  . LAPAROSCOPIC ASSISTED VAGINAL HYSTERECTOMY  11/05/2002   Dr. Matthew Saras, done for menorrhagia.  Benign pathology    ROS: Review of Systems Neg except as stated above PHYSICAL EXAM: BP 131/76   Pulse 88   Temp 98.4 F (36.9 C) (Oral)   Resp 16   Wt 242 lb 12.8 oz (110.1 kg)   SpO2 97%   BMI 38.03 kg/m   Wt Readings from Last 3 Encounters:  12/23/17 242 lb 12.8 oz (110.1 kg)  09/06/17 249 lb 12.8 oz (113.3 kg)  06/07/17 246 lb 9.6 oz (111.9 kg)    Physical Exam  General appearance - alert, well appearing, and in no distress Mental status - normal mood, behavior, speech, dress, motor activity, and thought processes Eyes - pupils equal and reactive, extraocular eye movements intact Neck - supple, no significant adenopathy Chest - clear to auscultation, no wheezes, rales or rhonchi, symmetric air entry Extremities - peripheral pulses normal, no pedal edema, no clubbing or cyanosis Diabetic Foot Exam - Simple   Simple Foot Form Visual Inspection No deformities, no ulcerations, no other skin breakdown bilaterally:  Yes Sensation Testing Intact to touch and monofilament testing bilaterally:  Yes Pulse Check Posterior Tibialis and Dorsalis pulse intact bilaterally:  Yes Comments     Results for orders placed or performed in visit on 12/23/17  POCT glucose (manual entry)  Result Value Ref Range   POC Glucose 198 (A) 70 - 99 mg/dl  POCT glycosylated hemoglobin (Hb A1C)  Result Value Ref Range    Hemoglobin A1C  4.0 - 5.6 %   HbA1c, POC (prediabetic range)  5.7 - 6.4 %   HbA1c, POC (controlled diabetic range) 6.6 0.0 - 7.0 %   Lab Results  Component Value Date   HGBA1C 6.6 12/23/2017   Lab Results  Component Value Date   WBC 6.0 06/07/2017   HGB 11.7 06/07/2017   HCT 37.8 06/07/2017   MCV 77 (L) 06/07/2017   PLT 393 (H) 06/07/2017  Chemistry      Component Value Date/Time   NA 140 06/07/2017 1450   K 4.2 06/07/2017 1450   CL 100 06/07/2017 1450   CO2 24 06/07/2017 1450   BUN 14 06/07/2017 1450   CREATININE 0.69 06/07/2017 1450   CREATININE 0.52 10/31/2015 1245      Component Value Date/Time   CALCIUM 9.6 06/07/2017 1450   ALKPHOS 85 06/07/2017 1450   AST 14 06/07/2017 1450   ALT 13 06/07/2017 1450   BILITOT 0.5 06/07/2017 1450        ASSESSMENT AND PLAN: 1. Controlled type 2 diabetes mellitus without complication, without long-term current use of insulin (HCC) A1C down more than 2 points.  Commended her on improved eating habits and increase regular exercise.  Keep up the good works - POCT glucose (manual entry) - POCT glycosylated hemoglobin (Hb A1C) - metFORMIN (GLUCOPHAGE-XR) 500 MG 24 hr tablet; TAKE 2 TABLETS BY MOUTH AFTER BREAKFAST, 1 TABLET BY MOUTH AFTER LUNCH, THEN 2 TABLETS AFTER DINNER  Dispense: 450 tablet; Refill: 3  2. Essential hypertension At goal.  Continue Lisinopril  3. Hyperlipidemia, unspecified hyperlipidemia type - atorvastatin (LIPITOR) 40 MG tablet; Take 1 tablet (40 mg total) by mouth daily.  Dispense: 90 tablet; Refill: 3  4. Iron deficiency anemia, unspecified iron deficiency anemia type Cont iron supplement  5. Obesity (BMI 30-39.9) See #1 above   Patient was given the opportunity to ask questions.  Patient verbalized understanding of the plan and was able to repeat key elements of the plan.   Orders Placed This Encounter  Procedures  . POCT glucose (manual entry)  . POCT glycosylated hemoglobin (Hb A1C)      Requested Prescriptions   Signed Prescriptions Disp Refills  . metFORMIN (GLUCOPHAGE-XR) 500 MG 24 hr tablet 450 tablet 3    Sig: TAKE 2 TABLETS BY MOUTH AFTER BREAKFAST, 1 TABLET BY MOUTH AFTER LUNCH, THEN 2 TABLETS AFTER DINNER  . atorvastatin (LIPITOR) 40 MG tablet 90 tablet 3    Sig: Take 1 tablet (40 mg total) by mouth daily.    Return in about 3 months (around 03/25/2018).  Karle Plumber, MD, FACP

## 2017-12-23 NOTE — Patient Instructions (Signed)
Keep up the good works.  You have loss 7 lbs.

## 2018-01-29 ENCOUNTER — Other Ambulatory Visit: Payer: Self-pay

## 2018-01-29 DIAGNOSIS — E119 Type 2 diabetes mellitus without complications: Secondary | ICD-10-CM

## 2018-01-30 MED ORDER — METFORMIN HCL ER 500 MG PO TB24: ORAL_TABLET | ORAL | 3 refills | Status: DC

## 2018-03-11 ENCOUNTER — Other Ambulatory Visit: Payer: Self-pay | Admitting: Internal Medicine

## 2018-03-20 ENCOUNTER — Ambulatory Visit: Payer: Medicare Other | Attending: Internal Medicine | Admitting: Internal Medicine

## 2018-03-20 ENCOUNTER — Encounter: Payer: Self-pay | Admitting: Internal Medicine

## 2018-03-20 VITALS — BP 143/80 | HR 74 | Temp 98.9°F | Resp 16 | Wt 237.0 lb

## 2018-03-20 DIAGNOSIS — Z79899 Other long term (current) drug therapy: Secondary | ICD-10-CM | POA: Diagnosis not present

## 2018-03-20 DIAGNOSIS — Z8249 Family history of ischemic heart disease and other diseases of the circulatory system: Secondary | ICD-10-CM | POA: Diagnosis not present

## 2018-03-20 DIAGNOSIS — Z6837 Body mass index (BMI) 37.0-37.9, adult: Secondary | ICD-10-CM | POA: Diagnosis not present

## 2018-03-20 DIAGNOSIS — Z833 Family history of diabetes mellitus: Secondary | ICD-10-CM | POA: Diagnosis not present

## 2018-03-20 DIAGNOSIS — Z886 Allergy status to analgesic agent status: Secondary | ICD-10-CM | POA: Insufficient documentation

## 2018-03-20 DIAGNOSIS — Z7984 Long term (current) use of oral hypoglycemic drugs: Secondary | ICD-10-CM | POA: Diagnosis not present

## 2018-03-20 DIAGNOSIS — E11649 Type 2 diabetes mellitus with hypoglycemia without coma: Secondary | ICD-10-CM

## 2018-03-20 DIAGNOSIS — I10 Essential (primary) hypertension: Secondary | ICD-10-CM

## 2018-03-20 DIAGNOSIS — M5431 Sciatica, right side: Secondary | ICD-10-CM

## 2018-03-20 DIAGNOSIS — E669 Obesity, unspecified: Secondary | ICD-10-CM | POA: Insufficient documentation

## 2018-03-20 DIAGNOSIS — Z23 Encounter for immunization: Secondary | ICD-10-CM | POA: Diagnosis not present

## 2018-03-20 DIAGNOSIS — E785 Hyperlipidemia, unspecified: Secondary | ICD-10-CM | POA: Insufficient documentation

## 2018-03-20 DIAGNOSIS — Z87891 Personal history of nicotine dependence: Secondary | ICD-10-CM | POA: Insufficient documentation

## 2018-03-20 DIAGNOSIS — E119 Type 2 diabetes mellitus without complications: Secondary | ICD-10-CM | POA: Diagnosis present

## 2018-03-20 DIAGNOSIS — E089 Diabetes mellitus due to underlying condition without complications: Secondary | ICD-10-CM | POA: Diagnosis not present

## 2018-03-20 LAB — POCT GLYCOSYLATED HEMOGLOBIN (HGB A1C): HbA1c, POC (prediabetic range): 6.3 % (ref 5.7–6.4)

## 2018-03-20 LAB — GLUCOSE, POCT (MANUAL RESULT ENTRY)
POC GLUCOSE: 66 mg/dL — AB (ref 70–99)
POC GLUCOSE: 93 mg/dL (ref 70–99)
POC Glucose: 79 mg/dl (ref 70–99)

## 2018-03-20 MED ORDER — LISINOPRIL 10 MG PO TABS
10.0000 mg | ORAL_TABLET | Freq: Every day | ORAL | 1 refills | Status: DC
Start: 2018-03-20 — End: 2018-10-21

## 2018-03-20 MED ORDER — CYCLOBENZAPRINE HCL 5 MG PO TABS: 5.0000 mg | ORAL_TABLET | Freq: Two times a day (BID) | ORAL | 0 refills | Status: DC | PRN

## 2018-03-20 MED ORDER — GLIMEPIRIDE 2 MG PO TABS: 1.0000 mg | ORAL_TABLET | Freq: Every day | ORAL | 6 refills | Status: DC

## 2018-03-20 MED ORDER — PREDNISONE 20 MG PO TABS: ORAL_TABLET | ORAL | 0 refills | Status: DC

## 2018-03-20 NOTE — Patient Instructions (Addendum)
Decrease Amaryl to 2 mg half a tablet daily. Increase lisinopril to 10 mg daily.  Influenza Virus Vaccine injection (Fluarix) What is this medicine? INFLUENZA VIRUS VACCINE (in floo EN zuh VAHY ruhs vak SEEN) helps to reduce the risk of getting influenza also known as the flu. This medicine may be used for other purposes; ask your health care provider or pharmacist if you have questions. COMMON BRAND NAME(S): Fluarix, Fluzone What should I tell my health care provider before I take this medicine? They need to know if you have any of these conditions: -bleeding disorder like hemophilia -fever or infection -Guillain-Barre syndrome or other neurological problems -immune system problems -infection with the human immunodeficiency virus (HIV) or AIDS -low blood platelet counts -multiple sclerosis -an unusual or allergic reaction to influenza virus vaccine, eggs, chicken proteins, latex, gentamicin, other medicines, foods, dyes or preservatives -pregnant or trying to get pregnant -breast-feeding How should I use this medicine? This vaccine is for injection into a muscle. It is given by a health care professional. A copy of Vaccine Information Statements will be given before each vaccination. Read this sheet carefully each time. The sheet may change frequently. Talk to your pediatrician regarding the use of this medicine in children. Special care may be needed. Overdosage: If you think you have taken too much of this medicine contact a poison control center or emergency room at once. NOTE: This medicine is only for you. Do not share this medicine with others. What if I miss a dose? This does not apply. What may interact with this medicine? -chemotherapy or radiation therapy -medicines that lower your immune system like etanercept, anakinra, infliximab, and adalimumab -medicines that treat or prevent blood clots like warfarin -phenytoin -steroid medicines like prednisone or  cortisone -theophylline -vaccines This list may not describe all possible interactions. Give your health care provider a list of all the medicines, herbs, non-prescription drugs, or dietary supplements you use. Also tell them if you smoke, drink alcohol, or use illegal drugs. Some items may interact with your medicine. What should I watch for while using this medicine? Report any side effects that do not go away within 3 days to your doctor or health care professional. Call your health care provider if any unusual symptoms occur within 6 weeks of receiving this vaccine. You may still catch the flu, but the illness is not usually as bad. You cannot get the flu from the vaccine. The vaccine will not protect against colds or other illnesses that may cause fever. The vaccine is needed every year. What side effects may I notice from receiving this medicine? Side effects that you should report to your doctor or health care professional as soon as possible: -allergic reactions like skin rash, itching or hives, swelling of the face, lips, or tongue Side effects that usually do not require medical attention (report to your doctor or health care professional if they continue or are bothersome): -fever -headache -muscle aches and pains -pain, tenderness, redness, or swelling at site where injected -weak or tired This list may not describe all possible side effects. Call your doctor for medical advice about side effects. You may report side effects to FDA at 1-800-FDA-1088. Where should I keep my medicine? This vaccine is only given in a clinic, pharmacy, doctor's office, or other health care setting and will not be stored at home. NOTE: This sheet is a summary. It may not cover all possible information. If you have questions about this medicine, talk to your doctor,  pharmacist, or health care provider.  2018 Elsevier/Gold Standard (2008-02-04 09:30:40)

## 2018-03-20 NOTE — Progress Notes (Signed)
Patient ID: Rhonda Miles, female    DOB: 15-Apr-1951  MRN: [id number]  CC: Diabetes and Hypertension   Subjective: Rhonda Miles is a 67 y.o. female who presents for chronic ds management. Her concerns today include:  Patient with history of DM type II, HL, IDA, obesity, elevated blood pressure without diagnosis of HTN  DM:  BS low today in office.  Eat a salad for lunch today. Checks BS QOD.  Lowest 69-132.  Lows about 3 x a mth.  Does not feel when it is low Walking almost daily Doing well with eating habits  HTN:  compliant with lisinopril and salt restriction.  No chest pains or shortness of breath.  No lower extremity edema.  C/o pain in RT lower back that radiates down the leg x 1 mth.  Some numbness in calf.  No loss of bowel or bladder function.  No anesthesia in the groin area.  no initiating factors Worse when she sits on right side Takes Aleve Patient Active Problem List   Diagnosis Date Noted  . Essential hypertension 06/07/2017  . Chronic posterior anal fissure 03/26/2017  . Prolapsed internal hemorrhoids, grade 2 03/26/2017  . Glaucoma (increased eye pressure) 07/12/2015  . Snoring 07/12/2015  . Bothersome menopausal vasomotor symptoms 06/03/2014  . Diabetes mellitus due to underlying condition without complications ([place]) 00/17/4944  . Obesity 02/13/2013     Current Outpatient Medications on File Prior to Visit  Medication Sig Dispense Refill  . AMBULATORY NON FORMULARY MEDICATION Diltiazem 2% gel mixed with the Lidocaine 5% Apply pea size amount inside rectum three times a day 30 g 3  . atorvastatin (LIPITOR) 40 MG tablet Take 1 tablet (40 mg total) by mouth daily. 90 tablet 3  . bisacodyl (DULCOLAX) 5 MG EC tablet Take 5 mg by mouth 2 (two) times daily as needed for moderate constipation.    . Blood Glucose Monitoring Suppl (BLOOD GLUCOSE METER) kit Use as instructed 1 each 0  . cholecalciferol (VITAMIN D) 1000 UNITS tablet Take 1,000 Units by mouth  daily.    . Ferrous Sulfate (IRON) 325 (65 Fe) MG TABS TAKE 1 TABLET BY MOUTH ONCE DAILY WITH BREAKFAST 90 tablet 0  . glucose blood test strip Use as instructed 100 each 12  . metFORMIN (GLUCOPHAGE-XR) 500 MG 24 hr tablet TAKE 2 TABLETS BY MOUTH AFTER BREAKFAST, 1 TABLET BY MOUTH AFTER LUNCH, THEN 2 TABLETS AFTER DINNER 450 tablet 3   No current facility-administered medications on file prior to visit.     Allergies  Allergen Reactions  . Asa [Aspirin] Other (See Comments)    shakey    Social History   Socioeconomic History  . Marital status: Married    Spouse name: Not on file  . Number of children: Not on file  . Years of education: Not on file  . Highest education level: Not on file  Occupational History  . 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 Financial resource strain: Not on file  . Food insecurity:    Worry: Not on file    Inability: Not on file  . Transportation needs:    Medical: Not on file    Non-medical: Not on file  Tobacco Use  . Smoking status: Former Smoker    Last attempt to quit: 10/22/1983    Years since quitting: 34.4  . Smokeless tobacco: Never Used  Substance and Sexual Activity  . Alcohol use: No  .  Drug use: No  . Sexual activity: Yes    Partners: Male  Lifestyle  . Physical activity:    Days per week: Not on file    Minutes per session: Not on file  . Stress: Not on file  Relationships  . Social connections:    Talks on phone: Not on file    Gets together: Not on file    Attends religious service: Not on file    Active member of club or organization: Not on file    Attends meetings of clubs or organizations: Not on file    Relationship status: Not on file  . Intimate partner violence:    Fear of current or ex partner: Not on file    Emotionally abused: Not on file    Physically abused: Not on file    Forced sexual activity: Not on file  Other Topics Concern  . Not on file  Social  History Narrative   Personal care assistant   Married    Family History  Problem Relation Age of Onset  . Diabetes Father   . Cancer - Other Father        FROM CHEMICAL EXPOSURE  . Hypertension Mother   . Colon cancer Maternal Grandmother 12  . Breast cancer Maternal Aunt   . Stomach cancer Neg Hx   . Pancreatic cancer Neg Hx     Past Surgical History:  Procedure Laterality Date  . BILATERAL SALPINGOOPHORECTOMY  11/05/2002  . COLONOSCOPY  09/2013  . LACERATION REPAIR Right 1957   ankle from lawnmower accident  . LAPAROSCOPIC ASSISTED VAGINAL HYSTERECTOMY  11/05/2002   Dr. Matthew Saras, done for menorrhagia.  Benign pathology    ROS: Review of Systems  PHYSICAL EXAM: BP (!) 143/80   Pulse 74   Temp 98.9 F (37.2 C) (Oral)   Resp 16   Wt 237 lb (107.5 kg)   SpO2 97%   BMI 37.12 kg/m   Physical Exam  General appearance - alert, well appearing, and in no distress Mental status - normal mood, behavior, speech, dress, motor activity, and thought processes Neck - supple, no significant adenopathy Chest - clear to auscultation, no wheezes, rales or rhonchi, symmetric air entry Heart - normal rate, regular rhythm, normal S1, S2, no murmurs, rubs, clicks or gallops Neurological - motor and sensory grossly normal bilaterally. Musculoskeletal -no tenderness on palpation of the lumbar spine.  Straight leg raise negative. Extremities -no lower extremity edema.  Results for orders placed or performed in visit on 03/20/18  POCT glucose (manual entry)  Result Value Ref Range   POC Glucose 66 (A) 70 - 99 mg/dl  POCT glycosylated hemoglobin (Hb A1C)  Result Value Ref Range   Hemoglobin A1C     HbA1c POC (<> result, manual entry)     HbA1c, POC (prediabetic range) 6.3 5.7 - 6.4 %   HbA1c, POC (controlled diabetic range)    POCT glucose (manual entry)  Result Value Ref Range   POC Glucose 79 70 - 99 mg/dl  POCT glucose (manual entry)  Result Value Ref Range   POC Glucose 93 70 -  99 mg/dl    ASSESSMENT AND PLAN: 1. Controlled type 2 diabetes mellitus with hypoglycemia, without long-term current use of insulin (HCC) Given the hypoglycemic unawareness I recommend decreasing glimepiride to 1 mg daily.  Continue to monitor blood sugars.  I told her the goal is to keep the blood sugars between 90-130.  If she finds that she is still getting  numbers below 90, she needs to call and let me know. Patient ate a candy bar while here in the office and we were able to get her blood sugar back up. - POCT glucose (manual entry) - POCT glycosylated hemoglobin (Hb A1C) - POCT glucose (manual entry) - glimepiride (AMARYL) 2 MG tablet; Take 0.5 tablets (1 mg total) by mouth daily before breakfast.  Dispense: 30 tablet; Refill: 6 - POCT glucose (manual entry)  2. Essential hypertension Not at goal.  Increase lisinopril to 10 mg daily. - lisinopril (PRINIVIL,ZESTRIL) 10 MG tablet; Take 1 tablet (10 mg total) by mouth daily.  Dispense: 90 tablet; Refill: 1  3. Need for immunization against influenza - Flu Vaccine QUAD 36+ mos IM  4. Sciatica of right side We will try her with a tapering dose of prednisone.  Patient advised that it can cause a bump in the blood sugars.  Flexeril to use as needed.  Patient warned that it can cause drowsiness.  Warm compresses to the lumbar spine also recommended. - predniSONE (DELTASONE) 20 MG tablet; 1.5 tabs daily x 3 days then 1 tab x 2 days.  Dispense: 7 tablet; Refill: 0 - cyclobenzaprine (FLEXERIL) 5 MG tablet; Take 1 tablet (5 mg total) by mouth 2 (two) times daily as needed for muscle spasms.  Dispense: 30 tablet; Refill: 0   Patient was given the opportunity to ask questions.  Patient verbalized understanding of the plan and was able to repeat key elements of the plan.   Orders Placed This Encounter  Procedures  . Flu Vaccine QUAD 36+ mos IM  . POCT glucose (manual entry)  . POCT glycosylated hemoglobin (Hb A1C)  . POCT glucose (manual  entry)  . POCT glucose (manual entry)     Requested Prescriptions   Signed Prescriptions Disp Refills  . lisinopril (PRINIVIL,ZESTRIL) 10 MG tablet 90 tablet 1    Sig: Take 1 tablet (10 mg total) by mouth daily.  Marland Kitchen glimepiride (AMARYL) 2 MG tablet 30 tablet 6    Sig: Take 0.5 tablets (1 mg total) by mouth daily before breakfast.  . predniSONE (DELTASONE) 20 MG tablet 7 tablet 0    Sig: 1.5 tabs daily x 3 days then 1 tab x 2 days.  . cyclobenzaprine (FLEXERIL) 5 MG tablet 30 tablet 0    Sig: Take 1 tablet (5 mg total) by mouth 2 (two) times daily as needed for muscle spasms.    Return in about 3 months (around 06/20/2018).  Karle Plumber, MD, FACP

## 2018-03-20 NOTE — Progress Notes (Signed)
Pt states she has been having pain coming from her right side. Pt states the pain comes from her hip and radiates down to her leg. Pt states sometimes the pain can start from the bottom and work itself up to the hip

## 2018-03-25 ENCOUNTER — Ambulatory Visit: Payer: Self-pay | Admitting: Internal Medicine

## 2018-05-13 ENCOUNTER — Other Ambulatory Visit: Payer: Self-pay | Admitting: Internal Medicine

## 2018-05-15 LAB — HM DIABETES EYE EXAM

## 2018-06-12 ENCOUNTER — Other Ambulatory Visit: Payer: Self-pay | Admitting: Internal Medicine

## 2018-06-26 ENCOUNTER — Encounter: Payer: Self-pay | Admitting: Internal Medicine

## 2018-06-26 ENCOUNTER — Ambulatory Visit: Payer: Medicare Other | Attending: Internal Medicine | Admitting: Internal Medicine

## 2018-06-26 VITALS — BP 152/77 | HR 84 | Temp 97.5°F | Wt 236.6 lb

## 2018-06-26 DIAGNOSIS — E119 Type 2 diabetes mellitus without complications: Secondary | ICD-10-CM

## 2018-06-26 DIAGNOSIS — E669 Obesity, unspecified: Secondary | ICD-10-CM

## 2018-06-26 DIAGNOSIS — Z23 Encounter for immunization: Secondary | ICD-10-CM

## 2018-06-26 DIAGNOSIS — M7660 Achilles tendinitis, unspecified leg: Secondary | ICD-10-CM | POA: Diagnosis not present

## 2018-06-26 DIAGNOSIS — D509 Iron deficiency anemia, unspecified: Secondary | ICD-10-CM | POA: Diagnosis not present

## 2018-06-26 DIAGNOSIS — I10 Essential (primary) hypertension: Secondary | ICD-10-CM | POA: Diagnosis not present

## 2018-06-26 MED ORDER — AMLODIPINE BESYLATE 5 MG PO TABS: 5.0000 mg | ORAL_TABLET | Freq: Every day | ORAL | 3 refills | Status: DC

## 2018-06-26 MED ORDER — PNEUMOCOCCAL 13-VAL CONJ VACC IM SUSP: 0.5000 mL | INTRAMUSCULAR | 0 refills | Status: AC

## 2018-06-26 NOTE — Progress Notes (Signed)
[id number]  a1c-6.4

## 2018-06-26 NOTE — Progress Notes (Signed)
Patient ID: Rhonda Miles, female    DOB: Dec 07, 1950  MRN: [id number]  CC: No chief complaint on file.   Subjective: Rhonda Miles is a 67 y.o. female who presents for chronic ds management Her concerns today include:  Patient with history of DM type II, HL,IDA,obesity, HTN  DM: compliant with Metformin and Amaryl Walking daily - tries to get in 6000 steps a day but most days she goes over this goal Eating habits: feels she does well with eating habits No blurred vision.  Saw Dr. Schuyler Amor 04/2018.  Hx of glaucoma. No numbness in hands/feet.  Had some swelling over RT achilles 2 wks ago that has just about resolve.  No initiating factors.  Injury in 1957 where RT ankle including the achilles was severed from the blade of the lawn mower  HYPERTENSION Currently taking: see medication list Med Adherence: _0  Yes    _1  No Medication side effects: _2  Yes    _3  No Adherence with salt restriction: _4  Yes    _5  No Home Monitoring?: _6  Yes    _7  No Monitoring Frequency: _8  Yes    _9  No Home BP results range: _10  Yes    _11  No SOB? _12  Yes    _13  No Chest Pain?: _14  Yes    _15  No Leg swelling?: _16  Yes    _17  No Headaches?: _18  Yes    _19  No Dizziness? _20  Yes    _21  No Comments:   Anemia: Reports compliance with iron.  Denies any tiredness or dizziness. Patient Active Problem List   Diagnosis Date Noted  . Essential hypertension 06/07/2017  . Chronic posterior anal fissure 03/26/2017  . Prolapsed internal hemorrhoids, grade 2 03/26/2017  . Glaucoma (increased eye pressure) 07/12/2015  . Snoring 07/12/2015  . Bothersome menopausal vasomotor symptoms 06/03/2014  . Diabetes mellitus due to underlying condition without complications ([place]) 78/46/9629  . Obesity 02/13/2013     Current Outpatient Medications on File Prior to Visit  Medication Sig Dispense Refill  . AMBULATORY NON FORMULARY MEDICATION Diltiazem 2% gel mixed with the Lidocaine 5% Apply pea size amount inside rectum  three times a day 30 g 3  . atorvastatin (LIPITOR) 40 MG tablet Take 1 tablet (40 mg total) by mouth daily. 90 tablet 3  . bisacodyl (DULCOLAX) 5 MG EC tablet Take 5 mg by mouth 2 (two) times daily as needed for moderate constipation.    . Blood Glucose Monitoring Suppl (BLOOD GLUCOSE METER) kit Use as instructed 1 each 0  . cholecalciferol (VITAMIN D) 1000 UNITS tablet Take 1,000 Units by mouth daily.    . cyclobenzaprine (FLEXERIL) 5 MG tablet Take 1 tablet (5 mg total) by mouth 2 (two) times daily as needed for muscle spasms. 30 tablet 0  . Ferrous Sulfate (IRON) 325 (65 Fe) MG TABS TAKE 1 TABLET BY MOUTH ONCE DAILY WITH BREAKFAST 90 tablet 0  . glimepiride (AMARYL) 2 MG tablet Take 0.5 tablets (1 mg total) by mouth daily before breakfast. 30 tablet 6  . glucose blood test strip Use as instructed 100 each 12  . lisinopril (PRINIVIL,ZESTRIL) 10 MG tablet Take 1 tablet (10 mg total) by mouth daily. 90 tablet 1  . metFORMIN (GLUCOPHAGE-XR) 500 MG 24 hr tablet TAKE 2 TABLETS BY MOUTH AFTER BREAKFAST, 1 TABLET BY MOUTH AFTER LUNCH, THEN 2 TABLETS AFTER DINNER 450 tablet 3   No current facility-administered medications on file prior to visit.     Allergies  Allergen Reactions  . Diona Fanti [  Aspirin] Other (See Comments)    shakey    Social History   Socioeconomic History  . Marital status: Married    Spouse name: Not on file  . Number of children: Not on file  . Years of education: Not on file  . Highest education level: Not on file  Occupational History  . 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 Financial resource strain: Not on file  . Food insecurity:    Worry: Not on file    Inability: Not on file  . Transportation needs:    Medical: Not on file    Non-medical: Not on file  Tobacco Use  . Smoking status: Former Smoker    Last attempt to quit: 10/22/1983    Years since quitting: 34.7  . Smokeless tobacco: Never Used  Substance and  Sexual Activity  . Alcohol use: No  . Drug use: No  . Sexual activity: Yes    Partners: Male  Lifestyle  . Physical activity:    Days per week: Not on file    Minutes per session: Not on file  . Stress: Not on file  Relationships  . Social connections:    Talks on phone: Not on file    Gets together: Not on file    Attends religious service: Not on file    Active member of club or organization: Not on file    Attends meetings of clubs or organizations: Not on file    Relationship status: Not on file  . Intimate partner violence:    Fear of current or ex partner: Not on file    Emotionally abused: Not on file    Physically abused: Not on file    Forced sexual activity: Not on file  Other Topics Concern  . Not on file  Social History Narrative   Personal care assistant   Married    Family History  Problem Relation Age of Onset  . Diabetes Father   . Cancer - Other Father        FROM CHEMICAL EXPOSURE  . Hypertension Mother   . Colon cancer Maternal Grandmother 17  . Breast cancer Maternal Aunt   . Stomach cancer Neg Hx   . Pancreatic cancer Neg Hx     Past Surgical History:  Procedure Laterality Date  . BILATERAL SALPINGOOPHORECTOMY  11/05/2002  . COLONOSCOPY  09/2013  . LACERATION REPAIR Right 1957   ankle from lawnmower accident  . LAPAROSCOPIC ASSISTED VAGINAL HYSTERECTOMY  11/05/2002   Dr. Matthew Saras, done for menorrhagia.  Benign pathology    ROS: Review of Systems Negative except as above. PHYSICAL EXAM: BP (!) 152/77   Pulse 84   Temp (!) 97.5 F (36.4 C) (Oral)   Wt 236 lb 9.6 oz (107.3 kg)   SpO2 96%   BMI 37.06 kg/m   Wt Readings from Last 3 Encounters:  06/26/18 236 lb 9.6 oz (107.3 kg)  03/20/18 237 lb (107.5 kg)  12/23/17 242 lb 12.8 oz (110.1 kg)   BP 160/84 Physical Exam  General appearance - alert, well appearing, and in no distress Mental status - normal mood, behavior, speech, dress, motor activity, and thought processes Neck -  supple, no significant adenopathy Chest - clear to auscultation, no wheezes, rales or rhonchi, symmetric air entry Heart - normal rate, regular rhythm, normal S1, S2, no murmurs, rubs, clicks or gallops Extremities - peripheral pulses normal, no pedal edema, no clubbing or cyanosis  MSK: Right foot- slight enlargement and irregularity felt along the mid aspect of the Achilles tendon.  No edema or erythema.  Slight tenderness on squeezing the Achilles tendon at this level.  Healed scar is noted at this level from previous injury that was reported by patient.  A1C 6.4/BS 95 ASSESSMENT AND PLAN:  1. Controlled type 2 diabetes mellitus without complication, without long-term current use of insulin ([place]) Commended her on good control of her diabetes.  She will continue Amaryl and metformin.  Continue healthy eating habits and regular exercise. - POCT glucose (manual entry) - POCT glycosylated hemoglobin (Hb A1C) - Microalbumin / creatinine urine ratio - CBC - Comprehensive metabolic panel - Lipid panel  2. Essential hypertension Not at goal.  Amlodipine 5 mg.  Continue low-salt diet. - amLODipine (NORVASC) 5 MG tablet; Take 1 tablet (5 mg total) by mouth daily.  Dispense: 90 tablet; Refill: 3  3. Iron deficiency anemia, unspecified iron deficiency anemia type Continue iron.  4. Pain in Achilles tendon Mild swelling over area of previous injury.  I recommend using  Salon Pos PRN  5. Need for vaccination against Streptococcus pneumoniae using pneumococcal conjugate vaccine 13  6. Obesity (BMI 30-39.9) See #1 above.   Patient was given the opportunity to ask questions.  Patient verbalized understanding of the plan and was able to repeat key elements of the plan.   Orders Placed This Encounter  Procedures  . Microalbumin / creatinine urine ratio  . CBC  . Comprehensive metabolic panel  . Lipid panel  . POCT glucose (manual entry)  . POCT glycosylated hemoglobin (Hb A1C)      Requested Prescriptions   Signed Prescriptions Disp Refills  . amLODipine (NORVASC) 5 MG tablet 90 tablet 3    Sig: Take 1 tablet (5 mg total) by mouth daily.  . pneumococcal 13-valent conjugate vaccine (PREVNAR 13) SUSP injection 0.5 mL 0    Sig: Inject 0.5 mLs into the muscle tomorrow at 10 am for 1 dose.    Return in about 3 months (around 09/25/2018).  Karle Plumber, MD, FACP

## 2018-06-26 NOTE — Patient Instructions (Addendum)
Your blood pressure is not at goal.  Our goal is to get you to 130/80 or lower.  Continue lisinopril.  We have added a new blood pressure medication called amlodipine 5 mg daily.  Your diabetes is under good control.  Continue healthy eating habits and regular exercise.   Pneumococcal Conjugate Vaccine (PCV13) What You Need to Know 1. Why get vaccinated? Vaccination can protect both children and adults from pneumococcal disease. Pneumococcal disease is caused by bacteria that can spread from person to person through close contact. It can cause ear infections, and it can also lead to more serious infections of the:  Lungs (pneumonia),  Blood (bacteremia), and  Covering of the brain and spinal cord (meningitis).  Pneumococcal pneumonia is most common among adults. Pneumococcal meningitis can cause deafness and brain damage, and it kills about 1 child in 10 who get it. Anyone can get pneumococcal disease, but children under 74 years of age and adults 49 years and older, people with certain medical conditions, and cigarette smokers are at the highest risk. Before there was a vaccine, the [place] saw:  more than 700 cases of meningitis,  about 13,000 blood infections,  about 5 million ear infections, and  about 200 deaths  in children under 5 each year from pneumococcal disease. Since vaccine became available, severe pneumococcal disease in these children has fallen by 88%. About 18,000 older adults die of pneumococcal disease each year in the [place]. Treatment of pneumococcal infections with penicillin and other drugs is not as effective as it used to be, because some strains of the disease have become resistant to these drugs. This makes prevention of the disease, through vaccination, even more important. 2. PCV13 vaccine Pneumococcal conjugate vaccine (called PCV13) protects against 13 types of pneumococcal bacteria. PCV13 is routinely given to children at 2, 4, 6, and  16-51 months of age. It is also recommended for children and adults 91 to 69 years of age with certain health conditions, and for all adults 32 years of age and older. Your doctor can give you details. 3. Some people should not get this vaccine Anyone who has ever had a life-threatening allergic reaction to a dose of this vaccine, to an earlier pneumococcal vaccine called PCV7, or to any vaccine containing diphtheria toxoid (for example, DTaP), should not get PCV13. Anyone with a severe allergy to any component of PCV13 should not get the vaccine. Tell your doctor if the person being vaccinated has any severe allergies. If the person scheduled for vaccination is not feeling well, your healthcare provider might decide to reschedule the shot on another day. 4. Risks of a vaccine reaction With any medicine, including vaccines, there is a chance of reactions. These are usually mild and go away on their own, but serious reactions are also possible. Problems reported following PCV13 varied by age and dose in the series. The most common problems reported among children were:  About half became drowsy after the shot, had a temporary loss of appetite, or had redness or tenderness where the shot was given.  About 1 out of 3 had swelling where the shot was given.  About 1 out of 3 had a mild fever, and about 1 in 20 had a fever over 102.60F.  Up to about 8 out of 10 became fussy or irritable.  Adults have reported pain, redness, and swelling where the shot was given; also mild fever, fatigue, headache, chills, or muscle pain. Young children who get PCV13 along  with inactivated flu vaccine at the same time may be at increased risk for seizures caused by fever. Ask your doctor for more information. Problems that could happen after any vaccine:  People sometimes faint after a medical procedure, including vaccination. Sitting or lying down for about 15 minutes can help prevent fainting, and injuries caused by a  fall. Tell your doctor if you feel dizzy, or have vision changes or ringing in the ears.  Some older children and adults get severe pain in the shoulder and have difficulty moving the arm where a shot was given. This happens very rarely.  Any medication can cause a severe allergic reaction. Such reactions from a vaccine are very rare, estimated at about 1 in a million doses, and would happen within a few minutes to a few hours after the vaccination. As with any medicine, there is a very small chance of a vaccine causing a serious injury or death. The safety of vaccines is always being monitored. For more information, visit: http://floyd.org/www.cdc.gov/vaccinesafety/ 5. What if there is a serious reaction? What should I look for? Look for anything that concerns you, such as signs of a severe allergic reaction, very high fever, or unusual behavior. Signs of a severe allergic reaction can include hives, swelling of the face and throat, difficulty breathing, a fast heartbeat, dizziness, and weakness-usually within a few minutes to a few hours after the vaccination. What should I do?  If you think it is a severe allergic reaction or other emergency that can't wait, call 9-1-1 or get the person to the nearest hospital. Otherwise, call your doctor.  Reactions should be reported to the Vaccine Adverse Event Reporting System (VAERS). Your doctor should file this report, or you can do it yourself through the VAERS web site at www.vaers.LAgents.nohhs.gov, or by calling 1-517-860-0320. ? VAERS does not give medical advice. 6. The National Vaccine Injury Compensation Program The [employer] Vaccine Injury Compensation Program (VICP) is a federal program that was created to compensate people who may have been injured by certain vaccines. Persons who believe they may have been injured by a vaccine can learn about the program and about filing a claim by calling 1-(828)664-6235 or visiting the VICP website at SpiritualWord.atwww.hrsa.gov/vaccinecompensation.  There is a time limit to file a claim for compensation. 7. How can I learn more?  Ask your healthcare provider. He or she can give you the vaccine package insert or suggest other sources of information.  Call your local or state health department.  Contact the Centers for Disease Control and Prevention (CDC): ? Call (315)756-74751-2568085375 (1-800-CDC-INFO) or ? Visit CDC's website at PicCapture.uywww.cdc.gov/vaccines Vaccine Information Statement, PCV13 Vaccine (05/27/2014) This information is not intended to replace advice given to you by your health care provider. Make sure you discuss any questions you have with your health care provider. Document Released: 05/06/2006 Document Revised: 03/29/2016 Document Reviewed: 03/29/2016 Elsevier Interactive Patient Education  2017 [employer]

## 2018-06-27 LAB — COMPREHENSIVE METABOLIC PANEL
ALBUMIN: 3.9 g/dL (ref 3.6–4.8)
ALK PHOS: 91 IU/L (ref 39–117)
ALT: 30 IU/L (ref 0–32)
AST: 22 IU/L (ref 0–40)
Albumin/Globulin Ratio: 1.2 (ref 1.2–2.2)
BILIRUBIN TOTAL: 0.4 mg/dL (ref 0.0–1.2)
BUN / CREAT RATIO: 14 (ref 12–28)
BUN: 10 mg/dL (ref 8–27)
CHLORIDE: 100 mmol/L (ref 96–106)
CO2: 23 mmol/L (ref 20–29)
Calcium: 9.7 mg/dL (ref 8.7–10.3)
Creatinine, Ser: 0.73 mg/dL (ref 0.57–1.00)
GFR calc Af Amer: 100 mL/min/{1.73_m2} (ref >59)
GFR calc non Af Amer: 87 mL/min/{1.73_m2} (ref >59)
GLUCOSE: 95 mg/dL (ref 65–99)
Globulin, Total: 3.2 g/dL (ref 1.5–4.5)
Potassium: 4.3 mmol/L (ref 3.5–5.2)
SODIUM: 141 mmol/L (ref 134–144)
Total Protein: 7.1 g/dL (ref 6.0–8.5)

## 2018-06-27 LAB — CBC
HEMOGLOBIN: 12.5 g/dL (ref 11.1–15.9)
Hematocrit: 38.4 % (ref 34.0–46.6)
MCH: 25.8 pg — AB (ref 26.6–33.0)
MCHC: 32.6 g/dL (ref 31.5–35.7)
MCV: 79 fL (ref 79–97)
Platelets: 322 10*3/uL (ref 150–450)
RBC: 4.84 x10E6/uL (ref 3.77–5.28)
RDW: 14.4 % (ref 12.3–15.4)
WBC: 5.7 10*3/uL (ref 3.4–10.8)

## 2018-06-27 LAB — LIPID PANEL
CHOLESTEROL TOTAL: 106 mg/dL (ref 100–199)
Chol/HDL Ratio: 2.2 ratio (ref 0.0–4.4)
HDL: 49 mg/dL (ref >39)
LDL Calculated: 43 mg/dL (ref 0–99)
Triglycerides: 72 mg/dL (ref 0–149)
VLDL CHOLESTEROL CAL: 14 mg/dL (ref 5–40)

## 2018-06-27 LAB — GLUCOSE, POCT (MANUAL RESULT ENTRY): POC Glucose: 95 mg/dl (ref 70–99)

## 2018-06-27 LAB — MICROALBUMIN / CREATININE URINE RATIO
CREATININE, UR: 109.2 mg/dL
Microalb/Creat Ratio: 7 mg/g creat (ref 0.0–30.0)
Microalbumin, Urine: 7.6 ug/mL

## 2018-06-27 LAB — POCT GLYCOSYLATED HEMOGLOBIN (HGB A1C): HBA1C, POC (PREDIABETIC RANGE): 6.4 % (ref 5.7–6.4)

## 2018-09-22 ENCOUNTER — Other Ambulatory Visit: Payer: Self-pay | Admitting: Internal Medicine

## 2018-09-25 ENCOUNTER — Ambulatory Visit: Payer: Medicare Other | Attending: Internal Medicine | Admitting: Internal Medicine

## 2018-09-25 ENCOUNTER — Encounter: Payer: Self-pay | Admitting: Internal Medicine

## 2018-09-25 VITALS — BP 132/83 | HR 79 | Temp 98.1°F | Resp 16 | Wt 234.8 lb

## 2018-09-25 DIAGNOSIS — E1165 Type 2 diabetes mellitus with hyperglycemia: Secondary | ICD-10-CM

## 2018-09-25 DIAGNOSIS — I10 Essential (primary) hypertension: Secondary | ICD-10-CM

## 2018-09-25 DIAGNOSIS — E785 Hyperlipidemia, unspecified: Secondary | ICD-10-CM

## 2018-09-25 DIAGNOSIS — E669 Obesity, unspecified: Secondary | ICD-10-CM

## 2018-09-25 DIAGNOSIS — IMO0001 Reserved for inherently not codable concepts without codable children: Secondary | ICD-10-CM

## 2018-09-25 DIAGNOSIS — D509 Iron deficiency anemia, unspecified: Secondary | ICD-10-CM

## 2018-09-25 DIAGNOSIS — M79671 Pain in right foot: Secondary | ICD-10-CM

## 2018-09-25 LAB — POCT GLYCOSYLATED HEMOGLOBIN (HGB A1C): HbA1c, POC (controlled diabetic range): 7.2 % — AB (ref 0.0–7.0)

## 2018-09-25 LAB — GLUCOSE, POCT (MANUAL RESULT ENTRY): POC Glucose: 96 mg/dl (ref 70–99)

## 2018-09-25 MED ORDER — ATORVASTATIN CALCIUM 40 MG PO TABS: 40.0000 mg | ORAL_TABLET | Freq: Every day | ORAL | 3 refills | Status: DC

## 2018-09-25 MED ORDER — IRON 325 (65 FE) MG PO TABS: ORAL_TABLET | ORAL | 1 refills | Status: DC

## 2018-09-25 NOTE — Progress Notes (Signed)
Pt states when she takes her shoes off and when she walks her right heel hurts  Pt states the pain has been going on for a month

## 2018-09-25 NOTE — Progress Notes (Signed)
Patient ID: Rhonda Miles, female    DOB: 20-Oct-1950  MRN: [id number]  CC: No chief complaint on file.   Subjective: Rhonda Miles is a 67 y.o. female who presents for chronic ds management Her concerns today include:  Patient with history of DM type II, HL,IDA,obesity, HTN  Patient complains of soreness in the right plantar heel when she walks without shoes on.  She has noticed this x1 month.  She does not feel it as long as she wear shoes.  No injury to the foot.  She wears good support tennis shoes at work  DM:  Checking BS few times a wk; range 112-115.  Out of lancets.  She plans to purchase a more this week Not walking as much as before because it is dark by the time she gets home from work.  She plans to start walking again given that daylight savings time will start this weekend  Eating habits:  Doing well Reports compliance with metformin and Amaryl.  HTN:  No device to check BP.  Reports compliance with medications.  She limits salt in the foods.  Denies any chest pains or shortness of breath.  No lower extremity edema.  HL: Admits that she has not been taking the atorvastatin as consistently as she should.  She is requesting refills.  Last lipid profile was done 3 months ago and was at goal.   Patient Active Problem List   Diagnosis Date Noted  . Essential hypertension 06/07/2017  . Chronic posterior anal fissure 03/26/2017  . Prolapsed internal hemorrhoids, grade 2 03/26/2017  . Glaucoma (increased eye pressure) 07/12/2015  . Snoring 07/12/2015  . Bothersome menopausal vasomotor symptoms 06/03/2014  . Diabetes mellitus due to underlying condition without complications ([place]) 54/27/0623  . Obesity 02/13/2013     Current Outpatient Medications on File Prior to Visit  Medication Sig Dispense Refill  . AMBULATORY NON FORMULARY MEDICATION Diltiazem 2% gel mixed with the Lidocaine 5% Apply pea size amount inside rectum three times a day 30 g 3  . amLODipine  (NORVASC) 5 MG tablet Take 1 tablet (5 mg total) by mouth daily. 90 tablet 3  . bisacodyl (DULCOLAX) 5 MG EC tablet Take 5 mg by mouth 2 (two) times daily as needed for moderate constipation.    . Blood Glucose Monitoring Suppl (BLOOD GLUCOSE METER) kit Use as instructed 1 each 0  . cholecalciferol (VITAMIN D) 1000 UNITS tablet Take 1,000 Units by mouth daily.    . cyclobenzaprine (FLEXERIL) 5 MG tablet Take 1 tablet (5 mg total) by mouth 2 (two) times daily as needed for muscle spasms. 30 tablet 0  . glimepiride (AMARYL) 2 MG tablet Take 0.5 tablets (1 mg total) by mouth daily before breakfast. 30 tablet 6  . glucose blood test strip Use as instructed 100 each 12  . lisinopril (PRINIVIL,ZESTRIL) 10 MG tablet Take 1 tablet (10 mg total) by mouth daily. 90 tablet 1  . metFORMIN (GLUCOPHAGE-XR) 500 MG 24 hr tablet TAKE 2 TABLETS BY MOUTH AFTER BREAKFAST, 1 TABLET BY MOUTH AFTER LUNCH, THEN 2 TABLETS AFTER DINNER 450 tablet 3   No current facility-administered medications on file prior to visit.     Allergies  Allergen Reactions  . Asa [Aspirin] Other (See Comments)    shakey    Social History   Socioeconomic History  . Marital status: Married    Spouse name: Not on file  . Number of children: Not on file  . Years of education:  Not on file  . Highest education level: Not on file  Occupational History  . 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 Financial resource strain: Not on file  . Food insecurity:    Worry: Not on file    Inability: Not on file  . Transportation needs:    Medical: Not on file    Non-medical: Not on file  Tobacco Use  . Smoking status: Former Smoker    Last attempt to quit: 10/22/1983    Years since quitting: 34.9  . Smokeless tobacco: Never Used  Substance and Sexual Activity  . Alcohol use: No  . Drug use: No  . Sexual activity: Yes    Partners: Male  Lifestyle  . Physical activity:    Days per week: Not  on file    Minutes per session: Not on file  . Stress: Not on file  Relationships  . Social connections:    Talks on phone: Not on file    Gets together: Not on file    Attends religious service: Not on file    Active member of club or organization: Not on file    Attends meetings of clubs or organizations: Not on file    Relationship status: Not on file  . Intimate partner violence:    Fear of current or ex partner: Not on file    Emotionally abused: Not on file    Physically abused: Not on file    Forced sexual activity: Not on file  Other Topics Concern  . Not on file  Social History Narrative   Personal care assistant   Married    Family History  Problem Relation Age of Onset  . Diabetes Father   . Cancer - Other Father        FROM CHEMICAL EXPOSURE  . Hypertension Mother   . Colon cancer Maternal Grandmother 74  . Breast cancer Maternal Aunt   . Stomach cancer Neg Hx   . Pancreatic cancer Neg Hx     Past Surgical History:  Procedure Laterality Date  . BILATERAL SALPINGOOPHORECTOMY  11/05/2002  . COLONOSCOPY  09/2013  . LACERATION REPAIR Right 1957   ankle from lawnmower accident  . LAPAROSCOPIC ASSISTED VAGINAL HYSTERECTOMY  11/05/2002   Dr. Matthew Saras, done for menorrhagia.  Benign pathology    ROS: Review of Systems Negative except as stated above  PHYSICAL EXAM: BP 132/83   Pulse 79   Temp 98.1 F (36.7 C) (Oral)   Resp 16   Wt 234 lb 12.8 oz (106.5 kg)   SpO2 98%   BMI 36.77 kg/m   Wt Readings from Last 3 Encounters:  09/25/18 234 lb 12.8 oz (106.5 kg)  06/26/18 236 lb 9.6 oz (107.3 kg)  03/20/18 237 lb (107.5 kg)   Physical Exam General appearance - alert, well appearing, and in no distress Mental status - normal mood, behavior, speech, dress, motor activity, and thought processes Mouth - mucous membranes moist, pharynx normal without lesions Chest - clear to auscultation, no wheezes, rales or rhonchi, symmetric air entry Heart - normal rate,  regular rhythm, normal S1, S2, no murmurs, rubs, clicks or gallops Extremities - peripheral pulses normal, no pedal edema, no clubbing or cyanosis Diabetic Foot Exam - Simple   Simple Foot Form Visual Inspection No deformities, no ulcerations, no other skin breakdown bilaterally:  Yes Sensation Testing Intact to touch and monofilament testing bilaterally:  Yes Pulse Check Posterior Tibialis and  Dorsalis pulse intact bilaterally:  Yes Comments No tenderness on palpation of the right heel     Results for orders placed or performed in visit on 09/25/18  POCT glucose (manual entry)  Result Value Ref Range   POC Glucose 96 70 - 99 mg/dl  POCT glycosylated hemoglobin (Hb A1C)  Result Value Ref Range   Hemoglobin A1C     HbA1c POC (<> result, manual entry)     HbA1c, POC (prediabetic range)     HbA1c, POC (controlled diabetic range) 7.2 (A) 0.0 - 7.0 %    CMP Latest Ref Rng & Units 06/26/2018 06/07/2017 10/31/2015  Glucose 65 - 99 mg/dL 95 96 108(H)  BUN 8 - 27 mg/dL '10 14 15  ' Creatinine 0.57 - 1.00 mg/dL 0.73 0.69 0.52  Sodium 134 - 144 mmol/L 141 140 140  Potassium 3.5 - 5.2 mmol/L 4.3 4.2 4.2  Chloride 96 - 106 mmol/L 100 100 103  CO2 20 - 29 mmol/L '23 24 28  ' Calcium 8.7 - 10.3 mg/dL 9.7 9.6 9.3  Total Protein 6.0 - 8.5 g/dL 7.1 7.6 7.2  Total Bilirubin 0.0 - 1.2 mg/dL 0.4 0.5 0.6  Alkaline Phos 39 - 117 IU/L 91 85 61  AST 0 - 40 IU/L '22 14 12  ' ALT 0 - 32 IU/L '30 13 19   ' Lipid Panel     Component Value Date/Time   CHOL 106 06/26/2018 1524   TRIG 72 06/26/2018 1524   HDL 49 06/26/2018 1524   CHOLHDL 2.2 06/26/2018 1524   CHOLHDL 2.9 10/31/2015 1245   VLDL 15 10/31/2015 1245   LDLCALC 43 06/26/2018 1524    CBC    Component Value Date/Time   WBC 5.7 06/26/2018 1524   WBC 5.3 10/31/2015 1245   RBC 4.84 06/26/2018 1524   RBC 4.85 10/31/2015 1245   HGB 12.5 06/26/2018 1524   HCT 38.4 06/26/2018 1524   PLT 322 06/26/2018 1524   MCV 79 06/26/2018 1524   MCH 25.8 (L)  06/26/2018 1524   MCH 24.1 (L) 10/31/2015 1245   MCHC 32.6 06/26/2018 1524   MCHC 31.3 (L) 10/31/2015 1245   RDW 14.4 06/26/2018 1524   LYMPHSABS 2.4 10/26/2013 1154   MONOABS 0.3 10/26/2013 1154   EOSABS 0.1 10/26/2013 1154   BASOSABS 0.0 10/26/2013 1154    ASSESSMENT AND PLAN: 1. Diabetes mellitus type 2, uncontrolled, without complications (HCC) U8E slightly increased compared to last visit.  Patient plans to work on increasing activity level by walking 2-3 times a week.  She will continue current dose of metformin and Amaryl.  Encourage her to continue healthy eating habits - POCT glucose (manual entry) - POCT glycosylated hemoglobin (Hb A1C)  2. Hyperlipidemia, unspecified hyperlipidemia type Encourage compliance with medication - atorvastatin (LIPITOR) 40 MG tablet; Take 1 tablet (40 mg total) by mouth daily.  Dispense: 90 tablet; Refill: 3  3. Essential hypertension Close to goal.  She will continue low-salt diet and lisinopril and amlodipine  4. Pain of right heel Differential diagnoses include bone spur versus plantar fasciitis.  Heel exercises recommended.  Recommend x-ray to evaluate for bone spur but patient declines  5. Obesity (BMI 30-39.9) See #1 above  6. Iron deficiency anemia, unspecified iron deficiency anemia type Patient requested refill on iron - Ferrous Sulfate (IRON) 325 (65 Fe) MG TABS; 1 tab PO daily  Dispense: 90 each; Refill: 1     Patient was given the opportunity to ask questions.  Patient verbalized understanding of the  plan and was able to repeat key elements of the plan.   Orders Placed This Encounter  Procedures  . POCT glucose (manual entry)  . POCT glycosylated hemoglobin (Hb A1C)     Requested Prescriptions   Signed Prescriptions Disp Refills  . atorvastatin (LIPITOR) 40 MG tablet 90 tablet 3    Sig: Take 1 tablet (40 mg total) by mouth daily.  . Ferrous Sulfate (IRON) 325 (65 Fe) MG TABS 90 each 1    Sig: 1 tab PO daily     Return in about 3 months (around 12/26/2018).  Karle Plumber, MD, FACP

## 2018-10-21 ENCOUNTER — Other Ambulatory Visit: Payer: Self-pay | Admitting: Internal Medicine

## 2018-10-21 DIAGNOSIS — I10 Essential (primary) hypertension: Secondary | ICD-10-CM

## 2018-12-26 ENCOUNTER — Encounter: Payer: Self-pay | Admitting: Internal Medicine

## 2018-12-26 ENCOUNTER — Other Ambulatory Visit: Payer: Self-pay

## 2018-12-26 ENCOUNTER — Ambulatory Visit: Payer: Medicare Other | Attending: Internal Medicine | Admitting: Internal Medicine

## 2018-12-26 DIAGNOSIS — E785 Hyperlipidemia, unspecified: Secondary | ICD-10-CM

## 2018-12-26 DIAGNOSIS — I10 Essential (primary) hypertension: Secondary | ICD-10-CM | POA: Diagnosis not present

## 2018-12-26 DIAGNOSIS — Z7189 Other specified counseling: Secondary | ICD-10-CM

## 2018-12-26 DIAGNOSIS — D509 Iron deficiency anemia, unspecified: Secondary | ICD-10-CM | POA: Diagnosis not present

## 2018-12-26 DIAGNOSIS — E119 Type 2 diabetes mellitus without complications: Secondary | ICD-10-CM

## 2018-12-26 MED ORDER — BLOOD PRESSURE MONITOR DEVI: 0 refills | Status: AC

## 2018-12-26 MED ORDER — LISINOPRIL 10 MG PO TABS: 10.0000 mg | ORAL_TABLET | Freq: Every day | ORAL | 1 refills | Status: DC

## 2018-12-26 MED ORDER — METFORMIN HCL ER 500 MG PO TB24: ORAL_TABLET | ORAL | 3 refills | Status: DC

## 2018-12-26 NOTE — Progress Notes (Signed)
Pt is needing a refill on Tramadol

## 2018-12-26 NOTE — Progress Notes (Signed)
Virtual Visit via Telephone Note Due to current restrictions/limitations of in-office visits due to the COVID-19 pandemic, this scheduled clinical appointment was converted to a telehealth visit  I connected with Lovett Sox on 12/26/18 at 4:04 p.m EDT by telephone and verified that I am speaking with the correct person using two identifiers. I am in my office.  The patient is at home.  Only the patient and myself participated in this encounter.  I discussed the limitations, risks, security and privacy concerns of performing an evaluation and management service by telephone and the availability of in person appointments. I also discussed with the patient that there may be a patient responsible charge related to this service. The patient expressed understanding and agreed to proceed.  History of Present Illness: Patient with history of DM type II, HL,IDA,obesity, HTN.  Patient was last seen 09/2018.  Today's appointment was for chronic disease management.  Woke up with pain in the neck about 2 1/2 wks. thinks she slept wrong on her pillow.  Took some muscle relaxant and a pain med that she had left.  Also took Aleve.  Pain is gone.  DM:  Last A1C 7.2 09/2018.  Checking BS daily in mornings. Range 90-123 -thinks she loss a few lbs Exercise: walking 2-3 x a wk Eating habits: she has cut back on the amount that she eats  HTN:  No device to check BP but would like one.  Reports compliance with meds -limits salt in foods -no CP/SOB/LE edema/HA/ dizziness  HL: Reports compliance with an tolerating Lipitor  IDA:  Compliant taking iron supplement  Observations/Objective: No direct obeservation    Chemistry      Component Value Date/Time   NA 141 06/26/2018 1524   K 4.3 06/26/2018 1524   CL 100 06/26/2018 1524   CO2 23 06/26/2018 1524   BUN 10 06/26/2018 1524   CREATININE 0.73 06/26/2018 1524   CREATININE 0.52 10/31/2015 1245      Component Value Date/Time   CALCIUM 9.7 06/26/2018  1524   ALKPHOS 91 06/26/2018 1524   AST 22 06/26/2018 1524   ALT 30 06/26/2018 1524   BILITOT 0.4 06/26/2018 1524     Lab Results  Component Value Date   WBC 5.7 06/26/2018   HGB 12.5 06/26/2018   HCT 38.4 06/26/2018   MCV 79 06/26/2018   PLT 322 06/26/2018   Assessment and Plan: 1. Controlled type 2 diabetes mellitus without complication, without long-term current use of insulin (HCC) Commended her on healthy eating habits and regular exercise.  Continue metformin. - metFORMIN (GLUCOPHAGE-XR) 500 MG 24 hr tablet; TAKE 2 TABLETS BY MOUTH AFTER BREAKFAST, 1 TABLET BY MOUTH AFTER LUNCH, THEN 2 TABLETS AFTER DINNER  Dispense: 450 tablet; Refill: 3  2. Essential hypertension Of control unknown.  Prescription sent to her pharmacy for blood pressure monitoring device.  Continue lisinopril and amlodipine - lisinopril (ZESTRIL) 10 MG tablet; Take 1 tablet (10 mg total) by mouth daily.  Dispense: 90 tablet; Refill: 1 - Blood Pressure Monitor DEVI; Use as directed to check home blood pressure 2-3 times a week  Dispense: 1 Device; Refill: 0  3. Hyperlipidemia, unspecified hyperlipidemia type Continue Lipitor.  4. Iron deficiency anemia, unspecified iron deficiency anemia type Continue iron supplement  5. Educated About Covid-19 Virus Infection Discussed the importance of social distancing, good handwashing and wearing of facial mask when in public   Follow Up Instructions: Follow-up in 3 months   I discussed the assessment and treatment  plan with the patient. The patient was provided an opportunity to ask questions and all were answered. The patient agreed with the plan and demonstrated an understanding of the instructions.   The patient was advised to call back or seek an in-person evaluation if the symptoms worsen or if the condition fails to improve as anticipated.  I provided 13 minutes of non-face-to-face time during this encounter.   Jonah Blueeborah Zyann Mabry, MD

## 2019-01-08 ENCOUNTER — Telehealth: Payer: Self-pay | Admitting: Internal Medicine

## 2019-01-08 ENCOUNTER — Other Ambulatory Visit: Payer: Self-pay | Admitting: Internal Medicine

## 2019-01-08 ENCOUNTER — Other Ambulatory Visit: Payer: Self-pay | Admitting: Pharmacist

## 2019-01-08 DIAGNOSIS — Z1231 Encounter for screening mammogram for malignant neoplasm of breast: Secondary | ICD-10-CM

## 2019-01-08 MED ORDER — METFORMIN HCL 500 MG PO TABS: ORAL_TABLET | ORAL | 0 refills | Status: DC

## 2019-01-08 NOTE — Telephone Encounter (Signed)
Will forward to Luke  

## 2019-01-08 NOTE — Telephone Encounter (Signed)
Orders sent for regular metformin 500 mg with the same sig.

## 2019-01-08 NOTE — Telephone Encounter (Signed)
Patient called stating that her metformin was recalled and wanted to know what she should do. Please follow up/.

## 2019-01-08 NOTE — Progress Notes (Signed)
Will send in for regular release metformin per auto-sub protocol.

## 2019-02-12 ENCOUNTER — Ambulatory Visit
Admission: RE | Admit: 2019-02-12 | Discharge: 2019-02-12 | Disposition: A | Discharge status: Home / Self Care | Payer: Medicare Other | Source: Ambulatory Visit | Attending: Internal Medicine | Admitting: Internal Medicine

## 2019-02-12 ENCOUNTER — Other Ambulatory Visit: Payer: Self-pay

## 2019-02-12 DIAGNOSIS — Z1231 Encounter for screening mammogram for malignant neoplasm of breast: Secondary | ICD-10-CM

## 2019-04-03 ENCOUNTER — Encounter: Payer: Self-pay | Admitting: Internal Medicine

## 2019-04-03 ENCOUNTER — Ambulatory Visit: Payer: Medicare Other | Attending: Internal Medicine | Admitting: Internal Medicine

## 2019-04-03 VITALS — BP 145/85

## 2019-04-03 DIAGNOSIS — E1169 Type 2 diabetes mellitus with other specified complication: Secondary | ICD-10-CM | POA: Diagnosis not present

## 2019-04-03 DIAGNOSIS — E785 Hyperlipidemia, unspecified: Secondary | ICD-10-CM

## 2019-04-03 DIAGNOSIS — I10 Essential (primary) hypertension: Secondary | ICD-10-CM

## 2019-04-03 DIAGNOSIS — Z23 Encounter for immunization: Secondary | ICD-10-CM

## 2019-04-03 DIAGNOSIS — E119 Type 2 diabetes mellitus without complications: Secondary | ICD-10-CM | POA: Diagnosis not present

## 2019-04-03 MED ORDER — METFORMIN HCL 500 MG PO TABS: ORAL_TABLET | ORAL | 6 refills | Status: DC

## 2019-04-03 MED ORDER — GLIMEPIRIDE 1 MG PO TABS: 1.0000 mg | ORAL_TABLET | Freq: Every day | ORAL | 5 refills | Status: DC

## 2019-04-03 MED ORDER — AMLODIPINE BESYLATE 10 MG PO TABS: 10.0000 mg | ORAL_TABLET | Freq: Every day | ORAL | 5 refills | Status: DC

## 2019-04-03 MED ORDER — LISINOPRIL 10 MG PO TABS: 10.0000 mg | ORAL_TABLET | Freq: Every day | ORAL | 1 refills | Status: DC

## 2019-04-03 NOTE — Progress Notes (Signed)
Virtual Visit via Telephone Note Due to current restrictions/limitations of in-office visits due to the COVID-19 pandemic, this scheduled clinical appointment was converted to a telehealth visit  I connected with Rhonda Miles on 04/03/19 at 3:53 p.m by telephone and verified that I am speaking with the correct person using two identifiers. I am in my office.  The patient is at home.  Only the patient and myself participated in this encounter.  I discussed the limitations, risks, security and privacy concerns of performing an evaluation and management service by telephone and the availability of in person appointments. I also discussed with the patient that there may be a patient responsible charge related to this service. The patient expressed understanding and agreed to proceed.   History of Present Illness: Patient with history of DM type II, HL,IDA,obesity, HTN.  Last eval 12/2018.  DM:  Checking BS daily in mornings.  Gives range 102-120 Compliant with Metformin and Amaryl.  Currently takes half a tablet of the 2 mg Amaryl.  She wanted to know if it comes in the 1 mg tablets so she is not having to break it in half. Walk daily for 30 mins-1 hr Eating more fruits, less fried foods. Drinks water and [employer] Next eye appt is 06/2019 with Dr. Schuyler Amor.  Last eye exam was June 05/2019 with [facility]  HTN:  BP this a.m BP 145/85.  SBP range 135 or higher/DBP 70-85 -she limits salt intake No CP/SOB/HA  HL:  Taking and tolerating Lipitor.   Outpatient Encounter Medications as of 04/03/2019  Medication Sig  . AMBULATORY NON FORMULARY MEDICATION Diltiazem 2% gel mixed with the Lidocaine 5% Apply pea size amount inside rectum three times a day  . amLODipine (NORVASC) 5 MG tablet Take 1 tablet (5 mg total) by mouth daily.  Marland Kitchen atorvastatin (LIPITOR) 40 MG tablet Take 1 tablet (40 mg total) by mouth daily.  . bisacodyl (DULCOLAX) 5 MG EC tablet Take 5 mg by mouth 2 (two) times daily as needed  for moderate constipation.  . Blood Glucose Monitoring Suppl (BLOOD GLUCOSE METER) kit Use as instructed  . Blood Pressure Monitor DEVI Use as directed to check home blood pressure 2-3 times a week  . cholecalciferol (VITAMIN D) 1000 UNITS tablet Take 1,000 Units by mouth daily.  . cyclobenzaprine (FLEXERIL) 5 MG tablet Take 1 tablet (5 mg total) by mouth 2 (two) times daily as needed for muscle spasms. (Patient not taking: Reported on 04/03/2019)  . Ferrous Sulfate (IRON) 325 (65 Fe) MG TABS 1 tab PO daily  . glimepiride (AMARYL) 2 MG tablet Take 0.5 tablets (1 mg total) by mouth daily before breakfast.  . glucose blood test strip Use as instructed  . lisinopril (ZESTRIL) 10 MG tablet Take 1 tablet (10 mg total) by mouth daily.  . metFORMIN (GLUCOPHAGE) 500 MG tablet Take 2 tablets by mouth after breakfast, 1 tablet by mouth after lunch, and 2 tablets by mouth after dinner.   No facility-administered encounter medications on file as of 04/03/2019.       Observations/Objective: No direct observation done  Assessment and Plan: 1. Controlled type 2 diabetes mellitus without complication, without long-term current use of insulin ([place]) Patient will continue metformin and Amaryl.  Encouraged her to continue healthy eating habits and regular exercise.  I have changed the Amaryl to the 1 mg pill - Hemoglobin A1c; Future - glimepiride (AMARYL) 1 MG tablet; Take 1 tablet (1 mg total) by mouth daily before breakfast.  Dispense:  30 tablet; Refill: 5 - metFORMIN (GLUCOPHAGE) 500 MG tablet; Take 2 tablets by mouth after breakfast, 1 tablet by mouth after lunch, and 2 tablets by mouth after dinner.  Dispense: 450 tablet; Refill: 6  2. Essential hypertension Not at goal based on blood pressure readings at home.  Goal is 130/80 or lower.  Encouraged her to continue DASH diet.  Recommend increase Norvasc to 10 mg daily - amLODipine (NORVASC) 10 MG tablet; Take 1 tablet (10 mg total) by mouth daily.   Dispense: 30 tablet; Refill: 5 - lisinopril (ZESTRIL) 10 MG tablet; Take 1 tablet (10 mg total) by mouth daily.  Dispense: 90 tablet; Refill: 1  3. Hyperlipidemia associated with type 2 diabetes mellitus (HCC) Continue Lipitor.  4. Influenza vaccine needed She will come as a nurse only visit next week to get flu shot   Follow Up Instructions: 3 mths in person   I discussed the assessment and treatment plan with the patient. The patient was provided an opportunity to ask questions and all were answered. The patient agreed with the plan and demonstrated an understanding of the instructions.   The patient was advised to call back or seek an in-person evaluation if the symptoms worsen or if the condition fails to improve as anticipated.  I provided 17 minutes of non-face-to-face time during this encounter.   Karle Plumber, MD

## 2019-04-03 NOTE — Progress Notes (Signed)
Pt states her blood sugar this morning was 119

## 2019-04-07 ENCOUNTER — Other Ambulatory Visit: Payer: Self-pay

## 2019-04-07 ENCOUNTER — Ambulatory Visit: Payer: Medicare Other | Attending: Family Medicine | Admitting: Pharmacist

## 2019-04-07 DIAGNOSIS — Z23 Encounter for immunization: Secondary | ICD-10-CM | POA: Diagnosis not present

## 2019-04-07 NOTE — Progress Notes (Signed)
Patient presents for vaccination against influenza per orders of Dr. Johnson. Consent given. Counseling provided. No contraindications exists. Vaccine administered without incident.   

## 2019-04-15 ENCOUNTER — Telehealth: Payer: Self-pay | Admitting: Internal Medicine

## 2019-04-15 NOTE — Telephone Encounter (Signed)
Done

## 2019-04-15 NOTE — Telephone Encounter (Signed)
Pt called to request a proof on Flu shot faxed to [phone]

## 2019-06-19 ENCOUNTER — Other Ambulatory Visit: Payer: Self-pay | Admitting: Internal Medicine

## 2019-06-19 DIAGNOSIS — D509 Iron deficiency anemia, unspecified: Secondary | ICD-10-CM

## 2019-07-20 ENCOUNTER — Ambulatory Visit: Payer: Medicare Other | Admitting: Internal Medicine

## 2019-08-06 ENCOUNTER — Ambulatory Visit: Payer: Medicare Other | Admitting: Internal Medicine

## 2019-08-28 ENCOUNTER — Other Ambulatory Visit: Payer: Self-pay | Admitting: Pharmacist

## 2019-08-28 ENCOUNTER — Encounter: Payer: Self-pay | Admitting: Internal Medicine

## 2019-08-28 ENCOUNTER — Ambulatory Visit: Payer: Medicare Other | Attending: Internal Medicine | Admitting: Internal Medicine

## 2019-08-28 ENCOUNTER — Other Ambulatory Visit: Payer: Self-pay

## 2019-08-28 VITALS — BP 124/78 | HR 98 | Temp 97.1°F | Resp 16 | Ht 67.0 in | Wt 234.8 lb

## 2019-08-28 DIAGNOSIS — E785 Hyperlipidemia, unspecified: Secondary | ICD-10-CM | POA: Diagnosis not present

## 2019-08-28 DIAGNOSIS — D509 Iron deficiency anemia, unspecified: Secondary | ICD-10-CM | POA: Insufficient documentation

## 2019-08-28 DIAGNOSIS — E089 Diabetes mellitus due to underlying condition without complications: Secondary | ICD-10-CM

## 2019-08-28 DIAGNOSIS — E119 Type 2 diabetes mellitus without complications: Secondary | ICD-10-CM

## 2019-08-28 DIAGNOSIS — I10 Essential (primary) hypertension: Secondary | ICD-10-CM | POA: Diagnosis not present

## 2019-08-28 DIAGNOSIS — E1169 Type 2 diabetes mellitus with other specified complication: Secondary | ICD-10-CM

## 2019-08-28 LAB — GLUCOSE, POCT (MANUAL RESULT ENTRY): POC Glucose: 127 mg/dl — AB (ref 70–99)

## 2019-08-28 MED ORDER — ONETOUCH VERIO VI STRP: ORAL_STRIP | 6 refills | Status: DC

## 2019-08-28 MED ORDER — ONETOUCH DELICA LANCETS 33G MISC: 6 refills | Status: DC

## 2019-08-28 MED ORDER — ONETOUCH VERIO W/DEVICE KIT: PACK | 0 refills | Status: DC

## 2019-08-28 MED ORDER — IRON 325 (65 FE) MG PO TABS: ORAL_TABLET | ORAL | 0 refills | Status: DC

## 2019-08-28 MED ORDER — GLIMEPIRIDE 1 MG PO TABS: 1.0000 mg | ORAL_TABLET | Freq: Every day | ORAL | 5 refills | Status: DC

## 2019-08-28 MED ORDER — AMLODIPINE BESYLATE 10 MG PO TABS: 10.0000 mg | ORAL_TABLET | Freq: Every day | ORAL | 5 refills | Status: DC

## 2019-08-28 NOTE — Progress Notes (Signed)
Patient ID: NORLEEN XIE, female    DOB: [birth year]  MRN: [id number]  CC: Diabetes and Hypertension   Subjective: Rhonda Miles is a 69 y.o. female who presents for chronic ds management Her concerns today include:  Patient with history of DM type II, HL,IDA,obesity, HTN  DIABETES TYPE 2 Last A1C:   Results for orders placed or performed in visit on 09/25/18  POCT glucose (manual entry)  Result Value Ref Range   POC Glucose 96 70 - 99 mg/dl  POCT glycosylated hemoglobin (Hb A1C)  Result Value Ref Range   Hemoglobin A1C     HbA1c POC (<> result, manual entry)     HbA1c, POC (prediabetic range)     HbA1c, POC (controlled diabetic range) 7.2 (A) 0.0 - 7.0 %    Med Adherence:  '[x]'  Yes    '[]'  No Medication side effects:  '[]'  Yes    '[x]'  No Home Monitoring? no Home glucose results range: Diet Adherence: '[x]'  Yes  - does high protein snack for lunch, keeps portion sizes small Exercise: not walking as much as before because it is dark by the time she gets off work.  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 '[]'  Yes    '[x]'  No Retinopathy hx? '[]'  Yes    '[]'  No Last eye exam: Up-to-date with eye exam Comments:   HYPERTENSION Currently taking: see medication list Med Adherence: '[x]'  Yes-on Norvasc and lisinopril    '[]'  No Medication side effects: '[]'  Yes    '[x]'  No Adherence with salt restriction: '[]'  Yes    '[]'  No Home Monitoring?: '[x]'  Yes    '[]'  No Monitoring Frequency: QOD Home BP results range: 129-130s/79-83 SOB? '[]'  Yes    '[x]'  No Chest Pain?: '[]'  Yes    '[x]'  No Leg swelling?: '[]'  Yes    '[x]'  No Headaches?: '[]'  Yes    '[x]'  No Dizziness? '[]'  Yes    '[x]'  No Comments:   HL:  Compliant with and tolerating Lipitor  HM:  Had COVID vaccines Pzifer:  1/14 and 08/27/2019  Patient Active Problem List   Diagnosis Date Noted  . Influenza vaccine needed 04/03/2019  . Hyperlipidemia associated  with type 2 diabetes mellitus ([place]) 04/03/2019  . Essential hypertension 06/07/2017  . Chronic posterior anal fissure 03/26/2017  . Prolapsed internal hemorrhoids, grade 2 03/26/2017  . Glaucoma (increased eye pressure) 07/12/2015  . Snoring 07/12/2015  . Bothersome menopausal vasomotor symptoms 06/03/2014  . Diabetes mellitus due to underlying condition without complications ([place]) 32/95/1884  . Obesity 02/13/2013     Current Outpatient Medications on File Prior to Visit  Medication Sig Dispense Refill  . AMBULATORY NON FORMULARY MEDICATION Diltiazem 2% gel mixed with the Lidocaine 5% Apply pea size amount inside rectum three times a day 30 g 3  . atorvastatin (LIPITOR) 40 MG tablet Take 1 tablet (40 mg total) by mouth daily. 90 tablet 3  . bisacodyl (DULCOLAX) 5 MG EC tablet Take 5 mg by mouth 2 (two) times daily as needed for moderate constipation.    . Blood Pressure Monitor DEVI Use as directed to check home blood pressure 2-3 times a week 1 Device 0  . cholecalciferol (VITAMIN D) 1000 UNITS tablet Take 1,000 Units by mouth daily.    . cyclobenzaprine (FLEXERIL) 5 MG tablet Take 1 tablet (5 mg total) by mouth 2 (  two) times daily as needed for muscle spasms. (Patient not taking: Reported on 04/03/2019) 30 tablet 0  . lisinopril (ZESTRIL) 10 MG tablet Take 1 tablet (10 mg total) by mouth daily. 90 tablet 1  . metFORMIN (GLUCOPHAGE) 500 MG tablet Take 2 tablets by mouth after breakfast, 1 tablet by mouth after lunch, and 2 tablets by mouth after dinner. 450 tablet 6   No current facility-administered medications on file prior to visit.    Allergies  Allergen Reactions  . Asa [Aspirin] Other (See Comments)    shakey    Social History   Socioeconomic History  . Marital status: Married    Spouse name: Not on file  . Number of children: Not on file  . Years of education: Not on file  . Highest education level: Not on file  Occupational History  . Occupation: Archivist: [employer]  Tobacco Use  . Smoking status: Former Smoker    Quit date: 10/22/1983    Years since quitting: 35.8  . Smokeless tobacco: Never Used  Substance and Sexual Activity  . Alcohol use: No  . Drug use: No  . Sexual activity: Yes    Partners: Male  Other Topics Concern  . Not on file  Social History Narrative   Personal care assistant   Married   Social Determinants of Health   Financial Resource Strain:   . Difficulty of Paying Living Expenses: Not on file  Food Insecurity:   . Worried About Charity fundraiser in the Last Year: Not on file  . Ran Out of Food in the Last Year: Not on file  Transportation Needs:   . Lack of Transportation (Medical): Not on file  . Lack of Transportation (Non-Medical): Not on file  Physical Activity:   . Days of Exercise per Week: Not on file  . Minutes of Exercise per Session: Not on file  Stress:   . Feeling of Stress : Not on file  Social Connections:   . Frequency of Communication with Friends and Family: Not on file  . Frequency of Social Gatherings with Friends and Family: Not on file  . Attends Religious Services: Not on file  . Active Member of Clubs or Organizations: Not on file  . Attends Archivist Meetings: Not on file  . Marital Status: Not on file  Intimate Partner Violence:   . Fear of Current or Ex-Partner: Not on file  . Emotionally Abused: Not on file  . Physically Abused: Not on file  . Sexually Abused: Not on file    Family History  Problem Relation Age of Onset  . Diabetes Father   . Cancer - Other Father        FROM CHEMICAL EXPOSURE  . Hypertension Mother   . Colon cancer Maternal Grandmother 22  . Breast cancer Maternal Aunt   . Stomach cancer Neg Hx   . Pancreatic cancer Neg Hx     Past Surgical History:  Procedure Laterality Date  . BILATERAL SALPINGOOPHORECTOMY  11/05/2002  . COLONOSCOPY  09/2013  . LACERATION REPAIR Right 1957   ankle from  lawnmower accident  . LAPAROSCOPIC ASSISTED VAGINAL HYSTERECTOMY  11/05/2002   Dr. Matthew Saras, done for menorrhagia.  Benign pathology    ROS: Review of Systems Negative except as stated above  PHYSICAL EXAM: BP 124/78   Pulse 98   Temp (!) 97.1 F (36.2 C)   Resp 16   Ht '5\' 7"'  (  1.702 m)   Wt 234 lb 12.8 oz (106.5 kg)   SpO2 96%   BMI 36.77 kg/m   Physical Exam  General appearance - alert, well appearing, and in no distress Mental status - normal mood, behavior, speech, dress, motor activity, and thought processes Neck - supple, no significant adenopathy Chest - clear to auscultation, no wheezes, rales or rhonchi, symmetric air entry Heart - normal rate, regular rhythm, normal S1, S2, no murmurs, rubs, clicks or gallops Extremities -trace lower extremity edema Diabetic Foot Exam - Simple   Simple Foot Form Visual Inspection No deformities, no ulcerations, no other skin breakdown bilaterally: Yes Sensation Testing Intact to touch and monofilament testing bilaterally: Yes Pulse Check Posterior Tibialis and Dorsalis pulse intact bilaterally: Yes Comments     CMP Latest Ref Rng & Units 06/26/2018 06/07/2017 10/31/2015  Glucose 65 - 99 mg/dL 95 96 108(H)  BUN 8 - 27 mg/dL '10 14 15  ' Creatinine 0.57 - 1.00 mg/dL 0.73 0.69 0.52  Sodium 134 - 144 mmol/L 141 140 140  Potassium 3.5 - 5.2 mmol/L 4.3 4.2 4.2  Chloride 96 - 106 mmol/L 100 100 103  CO2 20 - 29 mmol/L '23 24 28  ' Calcium 8.7 - 10.3 mg/dL 9.7 9.6 9.3  Total Protein 6.0 - 8.5 g/dL 7.1 7.6 7.2  Total Bilirubin 0.0 - 1.2 mg/dL 0.4 0.5 0.6  Alkaline Phos 39 - 117 IU/L 91 85 61  AST 0 - 40 IU/L '22 14 12  ' ALT 0 - 32 IU/L '30 13 19   ' Lipid Panel     Component Value Date/Time   CHOL 106 06/26/2018 1524   TRIG 72 06/26/2018 1524   HDL 49 06/26/2018 1524   CHOLHDL 2.2 06/26/2018 1524   CHOLHDL 2.9 10/31/2015 1245   VLDL 15 10/31/2015 1245   LDLCALC 43 06/26/2018 1524    CBC    Component Value Date/Time   WBC 5.7  06/26/2018 1524   WBC 5.3 10/31/2015 1245   RBC 4.84 06/26/2018 1524   RBC 4.85 10/31/2015 1245   HGB 12.5 06/26/2018 1524   HCT 38.4 06/26/2018 1524   PLT 322 06/26/2018 1524   MCV 79 06/26/2018 1524   MCH 25.8 (L) 06/26/2018 1524   MCH 24.1 (L) 10/31/2015 1245   MCHC 32.6 06/26/2018 1524   MCHC 31.3 (L) 10/31/2015 1245   RDW 14.4 06/26/2018 1524   LYMPHSABS 2.4 10/26/2013 1154   MONOABS 0.3 10/26/2013 1154   EOSABS 0.1 10/26/2013 1154   BASOSABS 0.0 10/26/2013 1154    ASSESSMENT AND PLAN: 1. Type 2 diabetes mellitus without complication, without long-term current use of insulin [facility]) Patient requesting prescription be sent to her pharmacy for diabetic testing supplies. Commended her on healthy eating habits and encouraged her to keep up the good work Encouraged her to try to get in some exercise indoors since too cold to walk outdoors.  Continue Amaryl and Metformin - Blood Glucose Monitoring Suppl (ONETOUCH VERIO) w/Device KIT; Use as directed to check blood sugar twice daily. E08.9  Dispense: 1 kit; Refill: 0 - glucose blood (ONETOUCH VERIO) test strip; Use as directed to check blood sugar twice daily. E08.9  Dispense: 100 each; Refill: 6 - OneTouch Delica Lancets 16R MISC; Use as directed to check blood sugar twice daily. E08.9  Dispense: 100 each; Refill: 6 - CBC; Future - Comprehensive metabolic panel; Future - Lipid panel; Future - Hemoglobin A1c; Future - glimepiride (AMARYL) 1 MG tablet; Take 1 tablet (1 mg total) by mouth  daily before breakfast.  Dispense: 30 tablet; Refill: 5  2. Iron deficiency anemia, unspecified iron deficiency anemia type Requested refill on iron - Ferrous Sulfate (IRON) 325 (65 Fe) MG TABS; Take 1 tablet by mouth once daily  Dispense: 90 tablet; Refill: 0  3. Essential hypertension Blood pressure today at goal.  She will continue amlodipine and lisinopril - amLODipine (NORVASC) 10 MG tablet; Take 1 tablet (10 mg total) by mouth daily.   Dispense: 30 tablet; Refill: 5  4. Hyperlipidemia associated with type 2 diabetes mellitus ([place]) Continue atorvastatin     Patient was given the opportunity to ask questions.  Patient verbalized understanding of the plan and was able to repeat key elements of the plan.   Orders Placed This Encounter  Procedures  . CBC  . Comprehensive metabolic panel  . Lipid panel  . Hemoglobin A1c  . POCT glucose (manual entry)     Requested Prescriptions   Signed Prescriptions Disp Refills  . Blood Glucose Monitoring Suppl (ONETOUCH VERIO) w/Device KIT 1 kit 0    Sig: Use as directed to check blood sugar twice daily. E08.9  . glucose blood (ONETOUCH VERIO) test strip 100 each 6    Sig: Use as directed to check blood sugar twice daily. E08.9  . OneTouch Delica Lancets 31G MISC 100 each 6    Sig: Use as directed to check blood sugar twice daily. E08.9  . Ferrous Sulfate (IRON) 325 (65 Fe) MG TABS 90 tablet 0    Sig: Take 1 tablet by mouth once daily  . glimepiride (AMARYL) 1 MG tablet 30 tablet 5    Sig: Take 1 tablet (1 mg total) by mouth daily before breakfast.  . amLODipine (NORVASC) 10 MG tablet 30 tablet 5    Sig: Take 1 tablet (10 mg total) by mouth daily.    Return in about 4 months (around 12/26/2019).  Karle Plumber, MD, FACP

## 2019-09-04 ENCOUNTER — Other Ambulatory Visit: Payer: Self-pay

## 2019-09-04 ENCOUNTER — Ambulatory Visit: Payer: Medicare Other | Attending: Internal Medicine

## 2019-09-04 DIAGNOSIS — E119 Type 2 diabetes mellitus without complications: Secondary | ICD-10-CM

## 2019-09-05 LAB — LIPID PANEL
Chol/HDL Ratio: 2.5 ratio (ref 0.0–4.4)
Cholesterol, Total: 88 mg/dL — ABNORMAL LOW (ref 100–199)
HDL: 35 mg/dL — ABNORMAL LOW (ref >39)
LDL Chol Calc (NIH): 40 mg/dL (ref 0–99)
Triglycerides: 52 mg/dL (ref 0–149)
VLDL Cholesterol Cal: 13 mg/dL (ref 5–40)

## 2019-09-05 LAB — COMPREHENSIVE METABOLIC PANEL
ALT: 29 IU/L (ref 0–32)
AST: 16 IU/L (ref 0–40)
Albumin/Globulin Ratio: 1.4 (ref 1.2–2.2)
Albumin: 4.2 g/dL (ref 3.8–4.8)
Alkaline Phosphatase: 84 IU/L (ref 39–117)
BUN/Creatinine Ratio: 15 (ref 12–28)
BUN: 10 mg/dL (ref 8–27)
Bilirubin Total: 0.5 mg/dL (ref 0.0–1.2)
CO2: 27 mmol/L (ref 20–29)
Calcium: 9.6 mg/dL (ref 8.7–10.3)
Chloride: 101 mmol/L (ref 96–106)
Creatinine, Ser: 0.68 mg/dL (ref 0.57–1.00)
GFR calc Af Amer: 105 mL/min/{1.73_m2} (ref >59)
GFR calc non Af Amer: 91 mL/min/{1.73_m2} (ref >59)
Globulin, Total: 3 g/dL (ref 1.5–4.5)
Glucose: 185 mg/dL — ABNORMAL HIGH (ref 65–99)
Potassium: 4.1 mmol/L (ref 3.5–5.2)
Sodium: 140 mmol/L (ref 134–144)
Total Protein: 7.2 g/dL (ref 6.0–8.5)

## 2019-09-05 LAB — CBC
Hematocrit: 32.5 % — ABNORMAL LOW (ref 34.0–46.6)
Hemoglobin: 12.1 g/dL (ref 11.1–15.9)
MCH: 32.5 pg (ref 26.6–33.0)
MCHC: 37.2 g/dL — ABNORMAL HIGH (ref 31.5–35.7)
MCV: 87 fL (ref 79–97)
Platelets: 338 10*3/uL (ref 150–450)
RBC: 3.72 x10E6/uL — ABNORMAL LOW (ref 3.77–5.28)
RDW: 15.7 % — ABNORMAL HIGH (ref 11.7–15.4)
WBC: 5.6 10*3/uL (ref 3.4–10.8)

## 2019-09-05 LAB — HEMOGLOBIN A1C
Est. average glucose Bld gHb Est-mCnc: 226 mg/dL
Hgb A1c MFr Bld: 9.5 % — ABNORMAL HIGH (ref 4.8–5.6)

## 2019-09-06 ENCOUNTER — Other Ambulatory Visit: Payer: Self-pay | Admitting: Internal Medicine

## 2019-09-06 DIAGNOSIS — E119 Type 2 diabetes mellitus without complications: Secondary | ICD-10-CM

## 2019-09-06 MED ORDER — GLIMEPIRIDE 2 MG PO TABS: 2.0000 mg | ORAL_TABLET | Freq: Every day | ORAL | 5 refills | Status: DC

## 2019-09-10 ENCOUNTER — Telehealth: Payer: Self-pay

## 2019-09-10 NOTE — Telephone Encounter (Signed)
-----   Message from Marcine Matar, MD sent at 09/06/2019 11:08 AM EST ----- Let patient know that I have sent the results of her labs to her MyChart account.  If she is unable to access her MyChart account please go over the results with her and she was sent to her MyChart account.

## 2019-09-10 NOTE — Telephone Encounter (Signed)
Patient has reviewed her lab results via mychart.

## 2019-11-18 ENCOUNTER — Other Ambulatory Visit: Payer: Self-pay | Admitting: Internal Medicine

## 2019-11-18 DIAGNOSIS — E785 Hyperlipidemia, unspecified: Secondary | ICD-10-CM

## 2019-11-21 ENCOUNTER — Other Ambulatory Visit: Payer: Self-pay | Admitting: Internal Medicine

## 2019-11-21 DIAGNOSIS — I10 Essential (primary) hypertension: Secondary | ICD-10-CM

## 2019-12-02 ENCOUNTER — Other Ambulatory Visit: Payer: Self-pay | Admitting: Internal Medicine

## 2019-12-02 DIAGNOSIS — E785 Hyperlipidemia, unspecified: Secondary | ICD-10-CM

## 2019-12-03 DIAGNOSIS — L089 Local infection of the skin and subcutaneous tissue, unspecified: Secondary | ICD-10-CM | POA: Diagnosis not present

## 2019-12-10 DIAGNOSIS — L089 Local infection of the skin and subcutaneous tissue, unspecified: Secondary | ICD-10-CM | POA: Diagnosis not present

## 2019-12-30 DIAGNOSIS — H401131 Primary open-angle glaucoma, bilateral, mild stage: Secondary | ICD-10-CM | POA: Diagnosis not present

## 2019-12-30 DIAGNOSIS — H2513 Age-related nuclear cataract, bilateral: Secondary | ICD-10-CM | POA: Diagnosis not present

## 2020-01-04 ENCOUNTER — Ambulatory Visit: Payer: Medicare Other | Attending: Internal Medicine | Admitting: Internal Medicine

## 2020-01-04 DIAGNOSIS — D509 Iron deficiency anemia, unspecified: Secondary | ICD-10-CM | POA: Diagnosis not present

## 2020-01-04 DIAGNOSIS — I10 Essential (primary) hypertension: Secondary | ICD-10-CM | POA: Diagnosis not present

## 2020-01-04 DIAGNOSIS — E669 Obesity, unspecified: Secondary | ICD-10-CM

## 2020-01-04 DIAGNOSIS — M545 Low back pain, unspecified: Secondary | ICD-10-CM

## 2020-01-04 DIAGNOSIS — L720 Epidermal cyst: Secondary | ICD-10-CM

## 2020-01-04 DIAGNOSIS — E1169 Type 2 diabetes mellitus with other specified complication: Secondary | ICD-10-CM

## 2020-01-04 MED ORDER — CLINDAMYCIN HCL 300 MG PO CAPS: 300.0000 mg | ORAL_CAPSULE | Freq: Three times a day (TID) | ORAL | 0 refills | Status: DC

## 2020-01-04 MED ORDER — CYCLOBENZAPRINE HCL 5 MG PO TABS: 5.0000 mg | ORAL_TABLET | Freq: Every day | ORAL | 0 refills | Status: DC | PRN

## 2020-01-04 MED ORDER — IRON 325 (65 FE) MG PO TABS: ORAL_TABLET | ORAL | 0 refills | Status: DC

## 2020-01-04 MED ORDER — TRAMADOL HCL ER 100 MG PO TB24: 100.0000 mg | ORAL_TABLET | Freq: Two times a day (BID) | ORAL | 0 refills | Status: DC | PRN

## 2020-01-04 NOTE — Progress Notes (Signed)
Pt states she went to the urgent care twice in May for a cyst on her breast pt states she thinks it's coming back

## 2020-01-04 NOTE — Progress Notes (Signed)
Virtual Visit via Telephone Note Due to current restrictions/limitations of in-office visits due to the COVID-19 pandemic, this scheduled clinical appointment was converted to a telehealth visit  I connected with Rhonda Miles on 01/04/20 at 4:58 p.m by telephone and verified that I am speaking with the correct person using two identifiers. I am in my office.  The patient is at home.  Only the patient and myself participated in this encounter.  I discussed the limitations, risks, security and privacy concerns of performing an evaluation and management service by telephone and the availability of in person appointments. I also discussed with the patient that there may be a patient responsible charge related to this service. The patient expressed understanding and agreed to proceed.   History of Present Illness: Patient with history of DM type II, HL,IDA,obesity, HTN  Pt reports having an infected cyst  Above LT breast dx 12/03/19.  Seen at [place] 12/03/2019.  Given abx.  Went back 12/10/19 and had I&D; prescribed a different abx.  It drained for several days then stopped. Site has closed up. However, she feels it is coming back because she feels a knot in there and it is a little sore.  No redness.    C/o pain across lower back x 1 wk. Started after she stood up from a pillow covered chair. No radiation down legs.  Little numbness in LT leg intermittently when standing for long time. Muscles feel tight.  Given muscle relaxant in past with pain pill. Taking Aleve once a day which helps. Using a heating pad which helps.  DM: doing well with DM. Checks BS BID; gives range of 89-123.  Compliant with oral meds.  Doing well eating habits.  Wgh down to 231 from 235 lbs from last visit.  HTN: BP has been good.  Limits salt in foods.  Compliant with Lisinopril and Norvasc.  Anemia: out of iron pills x a few days. She was still taking daily.  I had sent her a Mychart letter on last visit  telling her Hb was nl and to dec taking to QOD.  Outpatient Encounter Medications as of 01/04/2020  Medication Sig  . AMBULATORY NON FORMULARY MEDICATION Diltiazem 2% gel mixed with the Lidocaine 5% Apply pea size amount inside rectum three times a day  . amLODipine (NORVASC) 10 MG tablet Take 1 tablet by mouth once daily  . atorvastatin (LIPITOR) 40 MG tablet Take 1 tablet by mouth once daily  . bisacodyl (DULCOLAX) 5 MG EC tablet Take 5 mg by mouth 2 (two) times daily as needed for moderate constipation.  . Blood Glucose Monitoring Suppl (ONETOUCH VERIO) w/Device KIT Use as directed to check blood sugar twice daily. E08.9  . Blood Pressure Monitor DEVI Use as directed to check home blood pressure 2-3 times a week  . cholecalciferol (VITAMIN D) 1000 UNITS tablet Take 1,000 Units by mouth daily.  . cyclobenzaprine (FLEXERIL) 5 MG tablet Take 1 tablet (5 mg total) by mouth 2 (two) times daily as needed for muscle spasms. (Patient not taking: Reported on 04/03/2019)  . Ferrous Sulfate (IRON) 325 (65 Fe) MG TABS Take 1 tablet by mouth once daily  . glimepiride (AMARYL) 2 MG tablet Take 1 tablet (2 mg total) by mouth daily before breakfast. Dose increase  . glucose blood (ONETOUCH VERIO) test strip Use as directed to check blood sugar twice daily. E08.9  . lisinopril (ZESTRIL) 10 MG tablet Take 1 tablet (10 mg total) by mouth daily.  Marland Kitchen  metFORMIN (GLUCOPHAGE) 500 MG tablet Take 2 tablets by mouth after breakfast, 1 tablet by mouth after lunch, and 2 tablets by mouth after dinner.  Glory [place] Delica Lancets 59V MISC Use as directed to check blood sugar twice daily. E08.9   No facility-administered encounter medications on file as of 01/04/2020.      Observations/Objective: No direct observation none as this was a telephone encounter.  Assessment and Plan: 1. Epidermoid cyst of skin of chest I will have her scheduled with myself or nurse practitioner next available to have this area looked at and  make sure that does not involve the breast tissue.  In the meantime I have prescribed some antibiotics - clindamycin (CLEOCIN) 300 MG capsule; Take 1 capsule (300 mg total) by mouth 3 (three) times daily.  Dispense: 21 capsule; Refill: 0  2. Acute bilateral low back pain without sciatica Does not sound radicular.  I will give her a short course of tramadol and Flexeril.  I have warned that both medications can cause drowsiness and to take only when needed.  Continue use of heating pad as needed - cyclobenzaprine (FLEXERIL) 5 MG tablet; Take 1 tablet (5 mg total) by mouth daily as needed for muscle spasms.  Dispense: 30 tablet; Refill: 0 - traMADol (ULTRAM-ER) 100 MG 24 hr tablet; Take 1 tablet (100 mg total) by mouth 2 (two) times daily as needed for pain.  Dispense: 15 tablet; Refill: 0  3. Diabetes mellitus type 2 in obese ([place]) Blood sugars at goal.  She will come to the lab later this week to have A1c done.  Continue healthy eating habits and resume regular exercise once back pain resolves - Hemoglobin A1c; Future  4. Essential hypertension Patient reports blood pressure has been good.  She will continue her current medications  5. Iron deficiency anemia, unspecified iron deficiency anemia type Advised patient to cut back on taking iron to no more than twice a week  HM: Needs to be given Pneumovax on next office visit.  Follow Up Instructions: 3 mths  I discussed the assessment and treatment plan with the patient. The patient was provided an opportunity to ask questions and all were answered. The patient agreed with the plan and demonstrated an understanding of the instructions.   The patient was advised to call back or seek an in-person evaluation if the symptoms worsen or if the condition fails to improve as anticipated.  I provided 20 minutes of non-face-to-face time during this encounter.   Karle Plumber, MD

## 2020-01-12 ENCOUNTER — Other Ambulatory Visit: Payer: Self-pay | Admitting: Internal Medicine

## 2020-01-13 ENCOUNTER — Other Ambulatory Visit: Payer: Self-pay | Admitting: Internal Medicine

## 2020-01-13 DIAGNOSIS — I10 Essential (primary) hypertension: Secondary | ICD-10-CM

## 2020-01-28 ENCOUNTER — Other Ambulatory Visit: Payer: Self-pay

## 2020-01-28 ENCOUNTER — Other Ambulatory Visit: Payer: Self-pay | Admitting: Internal Medicine

## 2020-01-28 ENCOUNTER — Encounter: Payer: Self-pay | Admitting: Family

## 2020-01-28 ENCOUNTER — Ambulatory Visit: Payer: Medicare Other | Attending: Family | Admitting: Family

## 2020-01-28 VITALS — BP 144/82 | HR 82 | Temp 96.8°F | Resp 16 | Wt 226.2 lb

## 2020-01-28 DIAGNOSIS — E119 Type 2 diabetes mellitus without complications: Secondary | ICD-10-CM

## 2020-01-28 DIAGNOSIS — I10 Essential (primary) hypertension: Secondary | ICD-10-CM

## 2020-01-28 DIAGNOSIS — E785 Hyperlipidemia, unspecified: Secondary | ICD-10-CM

## 2020-01-28 DIAGNOSIS — D509 Iron deficiency anemia, unspecified: Secondary | ICD-10-CM | POA: Diagnosis not present

## 2020-01-28 DIAGNOSIS — L720 Epidermal cyst: Secondary | ICD-10-CM | POA: Diagnosis not present

## 2020-01-28 DIAGNOSIS — Z1231 Encounter for screening mammogram for malignant neoplasm of breast: Secondary | ICD-10-CM

## 2020-01-28 MED ORDER — ONETOUCH VERIO VI STRP: ORAL_STRIP | 6 refills | Status: DC

## 2020-01-28 MED ORDER — METFORMIN HCL 500 MG PO TABS: ORAL_TABLET | ORAL | 0 refills | Status: DC

## 2020-01-28 MED ORDER — ATORVASTATIN CALCIUM 40 MG PO TABS: 40.0000 mg | ORAL_TABLET | Freq: Every day | ORAL | 0 refills | Status: DC

## 2020-01-28 MED ORDER — AMLODIPINE BESYLATE 10 MG PO TABS: 10.0000 mg | ORAL_TABLET | Freq: Every day | ORAL | 0 refills | Status: DC

## 2020-01-28 MED ORDER — LISINOPRIL 20 MG PO TABS: 20.0000 mg | ORAL_TABLET | Freq: Every day | ORAL | 0 refills | Status: DC

## 2020-01-28 MED ORDER — ONETOUCH DELICA LANCETS 33G MISC: 6 refills | Status: DC

## 2020-01-28 MED ORDER — GLIMEPIRIDE 2 MG PO TABS: 2.0000 mg | ORAL_TABLET | Freq: Every day | ORAL | 0 refills | Status: DC

## 2020-01-28 NOTE — Progress Notes (Signed)
Patient ID: HOUSTON SURGES, female    DOB: 1950/11/16  MRN: [id number]  CC: Cyst follow-up  Subjective: Rhonda Miles is a 69 y.o. female with history of prolapsed internal hemorrhoids grade 2, essential hypertension, chronic posterior anal fissure, diabetes mellitus due to underlying condition without complications, hyperlipidemia associated with type 2 diabetes mellitus, obesity, glaucoma, bothersome menopausal vasomotor symptoms, snoring, and iron deficiency anemia who presents for cyst follow-up.   1. CHEST CYST FOLLOW-UP: Last visit 01/04/2020 with Dr. Wynetta Emery via telemedicine. During that encounter patient reported having an infected cyst above left breast diagnosed on 12/03/2019. Reports she was seen at [facility] 12/03/2019. Was given antibiotics. Went back on 12/10/2019 and had I&D and prescribed a different antibiotic. Reports it drained for several days and then stopped. Site closed up. Since then she reports she feels it is coming back because she feels a knot in there and it is a little sore. Denied redness. Plan to have patient come in for office visit to look at the area and make sure that does not involve the breast tissue. In the meantime antibiotics were prescribed Clindamycin 300 mg, 1 capsule by mouth three times daily for 7 days.  Today reports she feels cyst has decreased in size. Reports she is feeling better overall. Reports there is still a tiny knot where from where she had the I&D. Reports the site feels tender to touch. Reports taking Aleve as needed. Reports she occasionally has a minimal amount of drainage from the site which she notices on her bra. Denies redness and swelling at and around the site.  2. DIABETES TYPE 2 FOLLOW-UP: Last A1C: 7.0% ON 01/28/2020 Are you fasting today: _0  No, had watermelon, orange juice, and water Med Adherence:  _1  Yes    _2  No Medication side effects:  _3  Yes    _4  No Home Monitoring?  _5  Yes    _6  No Home glucose  results range: 73-112 Diet Adherence: _7  Yes    _8  No Exercise: _9  Yes    _10  No Hypoglycemic episodes?: _11  Yes    _12  No Numbness of the feet? _13  Yes    _14  No Retinopathy hx? _15  Yes    _16  No Last eye exam:  December 30, 2019 with Dr. Katy Fitch. Has prescription corrective lenses Comments: Last visit 08/28/2019 with Dr. Wynetta Emery. During that encounter continued on Amaryl and Metformin.  3. HYPERTENSION FOLLOW-UP: Currently taking: see medication list Have you taken your blood pressure medication today: _17  Yes _18  No  Med Adherence: _19  Yes    _20  No Medication side effects: _21  Yes    _22  No Adherence with salt restriction: _23  Yes    _24  No Exercise: Yes _25  No _26   Home Monitoring?: _27  Yes    _28  No Monitoring Frequency: _29  Yes    _30  No Home BP results range: _31  Yes, 130s-140s/80s Smoking _32  Yes _33  No  SOB? _34  Yes    _35  No Chest Pain?: _36  Yes    _37  No Leg swelling?: _38  Yes    _39  No Headaches?: _40  Yes    _41  No Dizziness? _42  Yes    _43  No Comments: Last visit 08/28/2019 with Dr. Wynetta Emery. During that encounter blood pressure at goal. Patient continued on Amlodipine and Lisinopril.  4. HYPERLIPIDEMIA FOLLOW-UP: Last Lipid Panel results:  HDL  Date Value Ref Range Status  09/04/2019 35 (L) >39 mg/dL Final   Triglycerides  Date Value Ref Range Status  09/04/2019 52 0 -  149 mg/dL Final    Are you fasting today: _0  Yes _1  No Med Adherence: _2  Yes    _3  No Medication side effects: _4  Yes    _5  No Muscle aches:  _6  Yes    _7  No Diet Adherence: _8  Yes    _9  No Comments:  Last visit 08/28/2019 with Dr. Wynetta Emery. During that encounter continued on Atorvastatin.  5. ANEMIA FOLLOW-UP: Last visit 01/04/2020 with Dr. Wynetta Emery. During that encounter patient advised to cut back on taking iron to no more than twice a day. Today patient reports she is currently taking iron pills twice weekly.   Patient Active Problem List   Diagnosis Date Noted  . Iron deficiency anemia 08/28/2019  . Influenza  vaccine needed 04/03/2019  . Hyperlipidemia associated with type 2 diabetes mellitus ([place]) 04/03/2019  . Essential hypertension 06/07/2017  . Chronic posterior anal fissure 03/26/2017  . Prolapsed internal hemorrhoids, grade 2 03/26/2017  . Glaucoma (increased eye pressure) 07/12/2015  . Snoring 07/12/2015  . Bothersome menopausal vasomotor symptoms 06/03/2014  . Diabetes mellitus due to underlying condition without complications ([place]) 45/80/9983  . Obesity 02/13/2013     Current Outpatient Medications on File Prior to Visit  Medication Sig Dispense Refill  . AMBULATORY NON FORMULARY MEDICATION Diltiazem 2% gel mixed with the Lidocaine 5% Apply pea size amount inside rectum three times a day 30 g 3  . amLODipine (NORVASC) 10 MG tablet Take 1 tablet by mouth once daily 90 tablet 0  . atorvastatin (LIPITOR) 40 MG tablet Take 1 tablet by mouth once daily 90 tablet 0  . bisacodyl (DULCOLAX) 5 MG EC tablet Take 5 mg by mouth 2 (two) times daily as needed for moderate constipation.    . Blood Glucose Monitoring Suppl (ONETOUCH VERIO) w/Device KIT Use as directed to check blood sugar twice daily. E08.9 1 kit 0  . Blood Pressure Monitor DEVI Use as directed to check home blood pressure 2-3 times a week 1 Device 0  . cholecalciferol (VITAMIN D) 1000 UNITS tablet Take 1,000 Units by mouth daily.    . clindamycin (CLEOCIN) 300 MG capsule Take 1 capsule (300 mg total) by mouth 3 (three) times daily. 21 capsule 0  . cyclobenzaprine (FLEXERIL) 5 MG tablet Take 1 tablet (5 mg total) by mouth daily as needed for muscle spasms. 30 tablet 0  . Ferrous Sulfate (IRON) 325 (65 Fe) MG TABS Take 1 tablet by mouth twice a week 90 tablet 0  . glimepiride (AMARYL) 2 MG tablet Take 1 tablet (2 mg total) by mouth daily before breakfast. Dose increase 30 tablet 5  . glucose blood (ONETOUCH VERIO) test strip Use as directed to check blood sugar twice daily. E08.9 100 each 6  . lisinopril (ZESTRIL) 10 MG tablet Take 1  tablet by mouth once daily 30 tablet 0  . metFORMIN (GLUCOPHAGE) 500 MG tablet Take 2 tablets by mouth after breakfast, 1 tablet by mouth after lunch, and 2 tablets by mouth after dinner. 450 tablet 6  . OneTouch Delica Lancets 38S MISC Use as directed to check blood sugar twice daily. E08.9 100 each 6  . traMADol (ULTRAM-ER) 100 MG 24 hr tablet Take 1 tablet (100 mg total) by mouth 2 (two) times daily as needed for pain. 15 tablet 0   No current facility-administered medications on file prior to visit.    Allergies  Allergen Reactions  . Asa [Aspirin] Other (See Comments)    shakey    Social History   Socioeconomic  History  . Marital status: Married    Spouse name: Not on file  . Number of children: Not on file  . Years of education: Not on file  . Highest education level: Not on file  Occupational History  . Occupation: Physiological scientist: [employer]  Tobacco Use  . Smoking status: Former Smoker    Quit date: 10/22/1983    Years since quitting: 36.2  . Smokeless tobacco: Never Used  Vaping Use  . Vaping Use: Never used  Substance and Sexual Activity  . Alcohol use: No  . Drug use: No  . Sexual activity: Yes    Partners: Male  Other Topics Concern  . Not on file  Social History Narrative   Personal care assistant   Married   Social Determinants of Health   Financial Resource Strain:   . Difficulty of Paying Living Expenses:   Food Insecurity:   . Worried About Charity fundraiser in the Last Year:   . Arboriculturist in the Last Year:   Transportation Needs:   . Film/video editor (Medical):   Marland Kitchen Lack of Transportation (Non-Medical):   Physical Activity:   . Days of Exercise per Week:   . Minutes of Exercise per Session:   Stress:   . Feeling of Stress :   Social Connections:   . Frequency of Communication with Friends and Family:   . Frequency of Social Gatherings with Friends and Family:   . Attends Religious Services:   .  Active Member of Clubs or Organizations:   . Attends Archivist Meetings:   Marland Kitchen Marital Status:   Intimate Partner Violence:   . Fear of Current or Ex-Partner:   . Emotionally Abused:   Marland Kitchen Physically Abused:   . Sexually Abused:     Family History  Problem Relation Age of Onset  . Diabetes Father   . Cancer - Other Father        FROM CHEMICAL EXPOSURE  . Hypertension Mother   . Colon cancer Maternal Grandmother 14  . Breast cancer Maternal Aunt   . Stomach cancer Neg Hx   . Pancreatic cancer Neg Hx     Past Surgical History:  Procedure Laterality Date  . BILATERAL SALPINGOOPHORECTOMY  11/05/2002  . COLONOSCOPY  09/2013  . LACERATION REPAIR Right 1957   ankle from lawnmower accident  . LAPAROSCOPIC ASSISTED VAGINAL HYSTERECTOMY  11/05/2002   Dr. Matthew Saras, done for menorrhagia.  Benign pathology    ROS: Review of Systems Negative except as stated above  PHYSICAL EXAM: Vitals with BMI 01/28/2020 08/28/2019 08/28/2019  Height - - _0   Weight 226 lbs 3 oz - 234 lbs 13 oz  BMI - - 37.90  Systolic 240 973 532  Diastolic 82 78 82  Pulse 82 - 98  SpO2- 97%, room air  Temperature- 96.62F, oral  Physical Exam General appearance - alert, well appearing, and in no distress and oriented to person, place, and time Mental status - alert, oriented to person, place, and time, normal mood, behavior, speech, dress, motor activity, and thought processes Neck - supple, no significant adenopathy Lymphatics - no palpable lymphadenopathy, no hepatosplenomegaly Chest - clear to auscultation, no wheezes, rales or rhonchi, symmetric air entry, no tachypnea, retractions or cyanosis Heart - normal rate, regular rhythm, normal S1, S2, no murmurs, rubs, clicks or gallops Breasts - not examined, patient declined Skin - hyperpigmented immobile kernel-sized scar tissue at left upper  chest; no edema, erythema, or drainage present  Results for orders placed or performed in visit on 50/53/97    Basic Metabolic Panel  Result Value Ref Range   Glucose 76 65 - 99 mg/dL   BUN 13 8 - 27 mg/dL   Creatinine, Ser 0.57 0.57 - 1.00 mg/dL   GFR calc non Af Amer 96 >59 mL/min/1.73   GFR calc Af Amer 111 >59 mL/min/1.73   BUN/Creatinine Ratio 23 12 - 28   Sodium 140 134 - 144 mmol/L   Potassium 4.2 3.5 - 5.2 mmol/L   Chloride 101 96 - 106 mmol/L   CO2 25 20 - 29 mmol/L   Calcium 9.4 8.7 - 10.3 mg/dL  CBC  Result Value Ref Range   WBC 6.5 3.4 - 10.8 x10E3/uL   RBC 4.53 3.77 - 5.28 x10E6/uL   Hemoglobin 12.1 11.1 - 15.9 g/dL   Hematocrit 38.3 34.0 - 46.6 %   MCV 85 79 - 97 fL   MCH 26.7 26.6 - 33.0 pg   MCHC 31.6 31 - 35 g/dL   RDW 14.3 11.7 - 15.4 %   Platelets 322 150 - 450 x10E3/uL  Hemoglobin A1c  Result Value Ref Range   Hgb A1c MFr Bld 7.0 (H) 4.8 - 5.6 %   Est. average glucose Bld gHb Est-mCnc 154 mg/dL    ASSESSMENT AND PLAN: 1. Epidermoid cyst of skin of chest: -Patient with history of cyst of left upper chest. Patient had an I&D on 12/03/2019 at [facility]. Since then patient has been treated with three courses of antibiotics.  -Patient with hyperpigmented immobile kernel-sized scar tissue at left upper chest. No edema, erythema, or drainage present.  -Today reports she still has tenderness at the healed I&D site and occasional minimal drainage.  -Referral for mammogram to evaluate whether breast tissue has been affected from this. -Last mammogram 02/12/2019 which resulted no evidence of lesions or malignancy. - MM DIGITAL SCREENING BILATERAL; Future  2. Type 2 diabetes mellitus without complication, without long-term current use of insulin ([place]): -Hemoglobin A1C today close to goal at 7.0%. Goal being <7.0%. -Continue Metformin and Glimepiride as prescribed.  -To achieve an A1C goal of less than or equal to 7.0 percent, a fasting blood sugar of 80 to 130 mg/dL and a postprandial glucose (90 to 120 minutes after a meal) less than 180 mg/dL. In the event  of sugars less than 60 mg/dl or greater than 400 mg/dl please notify the clinic ASAP. It is recommended that you undergo annual eye exams and annual foot exams. -Discussed the importance of healthy eating habits, low-carbohydrate diet, low-sugar diet, regular aerobic exercise (at least 150 minutes a week as tolerated) and medication compliance to achieve or maintain control of diabetes. -BMP to check kidney function and electrolyte balance. -CBC to check blood count. -Follow-up with primary physician in 3 months or sooner if needed. - Basic Metabolic Panel - CBC - Hemoglobin A1c - metFORMIN (GLUCOPHAGE) 500 MG tablet; Take 2 tablets by mouth after breakfast, 1 tablet by mouth after lunch, and 2 tablets by mouth after dinner.  Dispense: 450 tablet; Refill: 0 - glimepiride (AMARYL) 2 MG tablet; Take 1 tablet (2 mg total) by mouth daily before breakfast. Dose increase  Dispense: 90 tablet; Refill: 0 - glucose blood (ONETOUCH VERIO) test strip; Use as directed to check blood sugar twice daily. E08.9  Dispense: 100 each; Refill: 6 - OneTouch Delica Lancets 67H MISC; Use as directed to check blood sugar  twice daily. E08.9  Dispense: 100 each; Refill: 6  3. Essential hypertension:  -Patient's blood pressure elevated during today's visit. Patient asymptomatic without chest pain, chest pressure, and shortness of breath. -Blood pressure uncontrolled. Patient reports home blood pressures 130's-140's/80's. -Continue Amlodipine as prescribed.  -Increase Lisinopril from 5 mg daily to 10 mg daily to see if this brings blood pressures closer to goal. -Counseled patient on potential side effects of medication increase such as but not limited to lightheadedness or dizziness. Should the patient experience any of these symptoms patient should discontinue medication immediately and notify provider or seek medical attention immediately if severe. -Follow-up in 4 weeks with clinical pharmacist for blood pressure check.  Write down your blood pressure readings each day and bring those results along with your home blood pressure monitor to your appointment. -BMP to check kidney function and electrolyte balance.  -CBC to check blood count. -Follow-up with primary physician in 3 months or sooner if needed.  - Basic Metabolic Panel - CBC  - Amb Referral to Clinical Pharmacist - amLODipine (NORVASC) 10 MG tablet; Take 1 tablet (10 mg total) by mouth daily.  Dispense: 90 tablet; Refill: 0 - lisinopril (ZESTRIL) 20 MG tablet; Take 1 tablet (20 mg total) by mouth daily.  Dispense: 90 tablet; Refill: 0  4. Hyperlipidemia, unspecified hyperlipidemia type: -Continue Atorvastatin as prescribed. -Practice low-fat heart healthy diet and at least 150 minutes of moderate intensity exercise weekly as tolerated.  -Follow-up with primary physician in 3 months or sooner if needed. - atorvastatin (LIPITOR) 40 MG tablet; Take 1 tablet (40 mg total) by mouth daily.  Dispense: 90 tablet; Refill: 0  5. Iron deficiency anemia, unspecified iron deficiency anemia type: -Patient reports she is now taking iron pill twice weekly without complications.  -Continue Ferrous Sulfate 325 mg tablets twice weekly as prescribed. Refills available on file.  - CBC  6. Encounter for screening mammogram for malignant neoplasm of breast: -Referral for mammogram to screen for breast cancer. -Last mammogram 02/12/2019 which resulted no evidence of lesions or malignancy. - MM DIGITAL SCREENING BILATERAL; Future  Patient was given the opportunity to ask questions.  Patient verbalized understanding of the plan and was able to repeat key elements of the plan. Patient was given clear instructions to go to Emergency Department or return to medical center if symptoms don't improve, worsen, or new problems develop.The patient verbalized understanding.  Camillia Herter, NP

## 2020-01-28 NOTE — Patient Instructions (Addendum)
Referral for mammogram.   Continue Metformin and Glimepiride for diabetes.   Continue Amlodipine for high blood pressure.   Increase Lisinopril to 20 mg daily for high blood pressure.   Labs today.   Follow-up with clinical pharmacist in 1 month for blood pressure check.  Follow-up with primary physician in 3 months or sooner if needed. Mammogram A mammogram is an X-ray of the breasts that is done to check for changes that are not normal. This test can screen for and find any changes that may suggest breast cancer. Mammograms are regularly done on women. A man may have a mammogram if he has a lump or swelling in his breast. This test can also help to find other changes and variations in the breast. Tell a doctor:  About any allergies you have.  If you have breast implants.  If you have had previous breast disease, biopsy, or surgery.  If you are breastfeeding.  If you are younger than age 67.  If you have a family history of breast cancer.  Whether you are pregnant or may be pregnant. What are the risks? Generally, this is a safe procedure. However, problems may occur, including:  Exposure to radiation. Radiation levels are very low with this test.  The results being misinterpreted.  The need for further tests.  The inability of the mammogram to detect certain cancers. What happens before the procedure?  Have this test done about 1-2 weeks after your period. This is usually when your breasts are the least tender.  If you are visiting a new doctor or clinic, send any past mammogram images to your new doctor's office.  Wash your breasts and under your arms the day of the test.  Do not use deodorants, perfumes, lotions, or powders on the day of the test.  Take off any jewelry from your neck.  Wear clothes that you can change into and out of easily. What happens during the procedure?   You will undress from the waist up. You will put on a gown.  You will stand  in front of the X-ray machine.  Each breast will be placed between two plastic or glass plates. The plates will press down on your breast for a few seconds. Try to stay as relaxed as possible. This does not cause any harm to your breasts. Any discomfort you feel will be very brief.  X-rays will be taken from different angles of each breast. The procedure may vary among doctors and hospitals. What happens after the procedure?  The mammogram will be read by a specialist (radiologist).  You may need to do certain parts of the test again. This depends on the quality of the images.  Ask when your test results will be ready. Make sure you get your test results.  You may go back to your normal activities. Summary  A mammogram is a low energy X-ray of the breasts that is done to check for abnormal changes. A man may have this test if he has a lump or swelling in his breast.  Before the procedure, tell your doctor about any breast problems that you have had in the past.  Have this test done about 1-2 weeks after your period.  For the test, each breast will be placed between two plastic or glass plates. The plates will press down on your breast for a few seconds.  The mammogram will be read by a specialist (radiologist). Ask when your test results will be ready. Make  sure you get your test results. This information is not intended to replace advice given to you by your health care provider. Make sure you discuss any questions you have with your health care provider. Document Revised: 02/27/2018 Document Reviewed: 67/02/2018 Elsevier Patient Education  2020 [employer].

## 2020-01-29 ENCOUNTER — Other Ambulatory Visit: Payer: Self-pay | Admitting: Internal Medicine

## 2020-01-29 DIAGNOSIS — E785 Hyperlipidemia, unspecified: Secondary | ICD-10-CM

## 2020-01-29 DIAGNOSIS — E119 Type 2 diabetes mellitus without complications: Secondary | ICD-10-CM

## 2020-01-29 DIAGNOSIS — I10 Essential (primary) hypertension: Secondary | ICD-10-CM

## 2020-01-29 LAB — BASIC METABOLIC PANEL
BUN/Creatinine Ratio: 23 (ref 12–28)
BUN: 13 mg/dL (ref 8–27)
CO2: 25 mmol/L (ref 20–29)
Calcium: 9.4 mg/dL (ref 8.7–10.3)
Chloride: 101 mmol/L (ref 96–106)
Creatinine, Ser: 0.57 mg/dL (ref 0.57–1.00)
GFR calc Af Amer: 111 mL/min/{1.73_m2} (ref >59)
GFR calc non Af Amer: 96 mL/min/{1.73_m2} (ref >59)
Glucose: 76 mg/dL (ref 65–99)
Potassium: 4.2 mmol/L (ref 3.5–5.2)
Sodium: 140 mmol/L (ref 134–144)

## 2020-01-29 LAB — CBC
Hematocrit: 38.3 % (ref 34.0–46.6)
Hemoglobin: 12.1 g/dL (ref 11.1–15.9)
MCH: 26.7 pg (ref 26.6–33.0)
MCHC: 31.6 g/dL (ref 31.5–35.7)
MCV: 85 fL (ref 79–97)
Platelets: 322 10*3/uL (ref 150–450)
RBC: 4.53 x10E6/uL (ref 3.77–5.28)
RDW: 14.3 % (ref 11.7–15.4)
WBC: 6.5 10*3/uL (ref 3.4–10.8)

## 2020-01-29 LAB — HEMOGLOBIN A1C
Est. average glucose Bld gHb Est-mCnc: 154 mg/dL
Hgb A1c MFr Bld: 7 % — ABNORMAL HIGH (ref 4.8–5.6)

## 2020-01-29 MED ORDER — ONETOUCH DELICA LANCETS 33G MISC: 6 refills | Status: DC

## 2020-01-29 MED ORDER — AMLODIPINE BESYLATE 10 MG PO TABS: 10.0000 mg | ORAL_TABLET | Freq: Every day | ORAL | 0 refills | Status: DC

## 2020-01-29 MED ORDER — LISINOPRIL 20 MG PO TABS: 20.0000 mg | ORAL_TABLET | Freq: Every day | ORAL | 0 refills | Status: DC

## 2020-01-29 MED ORDER — ONETOUCH VERIO VI STRP: ORAL_STRIP | 6 refills | Status: DC

## 2020-01-29 MED ORDER — GLIMEPIRIDE 2 MG PO TABS: 2.0000 mg | ORAL_TABLET | Freq: Every day | ORAL | 0 refills | Status: DC

## 2020-01-29 MED ORDER — ATORVASTATIN CALCIUM 40 MG PO TABS: 40.0000 mg | ORAL_TABLET | Freq: Every day | ORAL | 0 refills | Status: DC

## 2020-01-29 MED ORDER — METFORMIN HCL 500 MG PO TABS: ORAL_TABLET | ORAL | 0 refills | Status: DC

## 2020-01-29 NOTE — Telephone Encounter (Signed)
Medication: amLODipine (NORVASC) 10 MG tablet [[id number]] , atorvastatin (LIPITOR) 40 MG tablet [[id number]] , glimepiride (AMARYL) 2 MG tablet [[id number]] , glucose blood (ONETOUCH VERIO) test strip [[id number]] , lisinopril (ZESTRIL) 20 MG tablet [[id number]] , metFORMIN (GLUCOPHAGE) 500 MG tablet [[id number]] ,OneTouch Delica Lancets 33G MISC [[id number]]  medications where sent to the wrong pharmacy. Can these be sent the correct pharmacy below please?  Has the patient contacted their pharmacy? Yes  (Agent: If no, request that the patient contact the pharmacy for the refill.) (Agent: If yes, when and what did the pharmacy advise?)  Preferred Pharmacy (with phone number or street name): [facility] - [place], [place] - [address]  Phone:  [phone] Fax:  [phone]     Agent: Please be advised that RX refills may take up to 3 business days. We ask that you follow-up with your pharmacy.

## 2020-02-01 ENCOUNTER — Other Ambulatory Visit: Payer: Self-pay | Admitting: Family

## 2020-02-01 DIAGNOSIS — Z1231 Encounter for screening mammogram for malignant neoplasm of breast: Secondary | ICD-10-CM

## 2020-02-01 NOTE — Progress Notes (Signed)
Please call patient with update.   Kidney function normal.   Liver function normal.  No anemia. Continue iron pills as prescribed.  A1C and glucose discussed in clinic.

## 2020-02-09 ENCOUNTER — Ambulatory Visit
Admission: RE | Admit: 2020-02-09 | Discharge: 2020-02-09 | Disposition: A | Discharge status: Home / Self Care | Payer: Medicare Other | Source: Ambulatory Visit | Attending: Family | Admitting: Family

## 2020-02-09 ENCOUNTER — Other Ambulatory Visit: Payer: Self-pay

## 2020-02-09 DIAGNOSIS — Z1231 Encounter for screening mammogram for malignant neoplasm of breast: Secondary | ICD-10-CM

## 2020-02-11 NOTE — Progress Notes (Signed)
Please call patient with update.   Mammogram shows no cancerous cells.   Repeat in 1 year.

## 2020-02-12 ENCOUNTER — Telehealth: Payer: Self-pay

## 2020-02-12 NOTE — Telephone Encounter (Signed)
Contacted pt to go over MM results pt is aware and doesn't have any questions or concerns  

## 2020-03-01 ENCOUNTER — Ambulatory Visit: Payer: Medicare Other | Admitting: Pharmacist

## 2020-03-03 ENCOUNTER — Ambulatory Visit: Payer: Medicare Other | Attending: Family | Admitting: Family

## 2020-03-03 ENCOUNTER — Encounter: Payer: Self-pay | Admitting: Family

## 2020-03-03 ENCOUNTER — Other Ambulatory Visit: Payer: Self-pay

## 2020-03-03 VITALS — BP 120/76 | HR 84 | Temp 96.8°F | Ht 67.0 in | Wt 228.2 lb

## 2020-03-03 DIAGNOSIS — Z0120 Encounter for dental examination and cleaning without abnormal findings: Secondary | ICD-10-CM

## 2020-03-03 DIAGNOSIS — Z23 Encounter for immunization: Secondary | ICD-10-CM

## 2020-03-03 DIAGNOSIS — Z0000 Encounter for general adult medical examination without abnormal findings: Secondary | ICD-10-CM

## 2020-03-03 DIAGNOSIS — Z13820 Encounter for screening for osteoporosis: Secondary | ICD-10-CM | POA: Diagnosis not present

## 2020-03-03 DIAGNOSIS — M85851 Other specified disorders of bone density and structure, right thigh: Secondary | ICD-10-CM

## 2020-03-03 NOTE — Patient Instructions (Addendum)
  Rhonda Miles , Thank you for taking time to come for your Medicare Wellness Visit. I appreciate your ongoing commitment to your health goals. Please review the following plan we discussed and let me know if I can assist you in the future.   These are the goals we discussed: Goals    . Exercise 4x per week (30 min per time)    . HEMOGLOBIN A1C < 7.0    . Weight (lb) < 200 lb (90.7 kg)         This is a list of the screening recommended for you and due dates:  Pneumonia vaccine today. Patient reports she will return for this.  Shingrix vaccine today. Patient reports she will return for this.   Health Maintenance  Topic Date Due  . DEXA scan (bone density measurement)  Never done  . Pneumonia vaccines (2 of 2 - PPSV23) 06/27/2019  . Flu Shot  02/21/2020  . Eye exam for diabetics  06/30/2020  . Hemoglobin A1C  07/30/2020  . Complete foot exam   08/27/2020  . Mammogram  02/08/2022  . Colon Cancer Screening  10/08/2023  . Tetanus Vaccine  03/28/2025  . COVID-19 Vaccine  Completed  .  Hepatitis C: One time screening is recommended by Center for Disease Control  (CDC) for  adults born from [birth year] through 1963.   Completed

## 2020-03-03 NOTE — Progress Notes (Signed)
Subjective:   Rhonda Miles is a 69 y.o. female who presents for an Initial Medicare Annual Wellness Visit.  Review of Systems     Objective:    Today's Vitals   03/03/20 1557 03/03/20 1603  BP: 120/76   Pulse: 84   Temp: (!) 96.8 F (36 C)   SpO2: 98%   Weight: 228 lb 3.2 oz (103.5 kg)   Height: _0  (1.702 m)   PainSc: 0-No pain 0-No pain   Body mass index is 35.74 kg/m.  Advanced Directives 03/03/2020 01/03/2017 05/29/2016 02/20/2016 10/31/2015 09/09/2015 03/29/2015  Does Patient Have a Medical Advance Directive? _1  No No  Would patient like information on creating a medical advance directive? 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 I went over with her living will and healthcare power of attorney. Patient given written packet also. She will look over it and consider executing a living will. If she does, I requested that she brings a copy of it for our records.  Current Medications (verified) Outpatient Encounter Medications as of 03/03/2020  Medication Sig  . amLODipine (NORVASC) 10 MG tablet Take 1 tablet (10 mg total) by mouth daily.  Marland Kitchen atorvastatin (LIPITOR) 40 MG tablet Take 1 tablet (40 mg total) by mouth daily.  . Blood Glucose Monitoring Suppl (ONETOUCH VERIO) w/Device KIT Use as directed to check blood sugar twice daily. E08.9  . Blood Pressure Monitor DEVI Use as directed to check home blood pressure 2-3 times a week  . cholecalciferol (VITAMIN D) 1000 UNITS tablet Take 1,000 Units by mouth daily.  . cyclobenzaprine (FLEXERIL) 5 MG tablet Take 1 tablet (5 mg total) by mouth daily as needed for muscle spasms.  . Ferrous Sulfate (IRON) 325 (65 Fe) MG TABS Take 1 tablet by mouth twice a week  .  glimepiride (AMARYL) 2 MG tablet Take 1 tablet (2 mg total) by mouth daily before breakfast. Dose increase  . glucose blood (ONETOUCH VERIO) test strip Use as directed to check blood sugar twice daily. E08.9  . lisinopril (ZESTRIL) 20 MG tablet Take 1 tablet (20 mg total) by mouth daily.  . metFORMIN (GLUCOPHAGE) 500 MG tablet Take 2 tablets by mouth after breakfast, 1 tablet by mouth after lunch, and 2 tablets by mouth after dinner.  Glory Rosebush Delica Lancets 16P MISC Use as directed to check blood sugar twice daily. E08.9  . AMBULATORY NON FORMULARY MEDICATION Diltiazem 2% gel mixed with the Lidocaine 5% Apply pea size amount inside rectum three times a day  . bisacodyl (DULCOLAX) 5 MG EC tablet Take 5 mg by mouth 2 (two) times daily as needed for moderate constipation.  . traMADol (ULTRAM-ER) 100 MG 24 hr tablet Take 1 tablet (100 mg total) by mouth 2 (two) times daily as needed for pain. (Patient not taking: Reported on 03/03/2020)  . [DISCONTINUED] clindamycin (CLEOCIN) 300 MG capsule Take 1 capsule (300 mg total) by mouth 3 (three) times daily.   No facility-administered encounter medications on file as of 03/03/2020.    Allergies (verified) Asa [aspirin]   History: Past Medical History:  Diagnosis Date  . Chronic low back pain without sciatica 10/31/2015  . Chronic posterior  anal fissure 03/26/2017  . Diabetes mellitus without complication ([place]) Dx 8850  . GERD (gastroesophageal reflux disease) Dx 2014  . Hemorrhoids   . Hyperlipidemia   . Prolapsed internal hemorrhoids, grade 2 03/26/2017   All 3 positions at anoscopy   Past Surgical History:  Procedure Laterality Date  . BILATERAL SALPINGOOPHORECTOMY  11/05/2002  . COLONOSCOPY  09/2013  . LACERATION REPAIR Right 1957   ankle from lawnmower accident  . LAPAROSCOPIC ASSISTED VAGINAL HYSTERECTOMY  11/05/2002   Dr. Matthew Saras, done for menorrhagia.  Benign pathology   Family History  Problem Relation Age of Onset  . Diabetes Father     . Cancer - Other Father        FROM CHEMICAL EXPOSURE  . Hypertension Mother   . Colon cancer Maternal Grandmother 14  . Breast cancer Maternal Aunt   . Stomach cancer Neg Hx   . Pancreatic cancer Neg Hx    Social History   Socioeconomic History  . Marital status: Married    Spouse name: Not on file  . Number of children: Not on file  . Years of education: Not on file  . Highest education level: Not on file  Occupational History  . Occupation: Physiological scientist: [employer]  Tobacco Use  . Smoking status: Former Smoker    Quit date: 10/22/1983    Years since quitting: 36.3  . Smokeless tobacco: Never Used  Vaping Use  . Vaping Use: Never used  Substance and Sexual Activity  . Alcohol use: No  . Drug use: No  . Sexual activity: Yes    Partners: Male  Other Topics Concern  . Not on file  Social History Narrative   Personal care assistant   Married   Social Determinants of Health   Financial Resource Strain:   . Difficulty of Paying Living Expenses:   Food Insecurity:   . Worried About Charity fundraiser in the Last Year:   . Arboriculturist in the Last Year:   Transportation Needs:   . Film/video editor (Medical):   Marland Kitchen Lack of Transportation (Non-Medical):   Physical Activity:   . Days of Exercise per Week:   . Minutes of Exercise per Session:   Stress:   . Feeling of Stress :   Social Connections:   . Frequency of Communication with Friends and Family:   . Frequency of Social Gatherings with Friends and Family:   . Attends Religious Services:   . Active Member of Clubs or Organizations:   . Attends Archivist Meetings:   Marland Kitchen Marital Status:     Tobacco Counseling Counseling given: Not Answered - Reports smoked for about 10 years on average half pack to 1 pack daily. Reports she quit smoking many years ago.  Clinical Intake:  Pre-visit preparation completed: Yes  Pain : No/denies pain Pain Score: 0-No  pain  BMI - recorded: 35.74 Nutritional Status: BMI > 30  Obese Diabetes: Yes CBG done?: No Did pt. bring in CBG monitor from home?: No (103 this morning)  How often do you need to have someone help you when you read instructions, pamphlets, or other written materials from your doctor or pharmacy?: 1 - Never What is the last grade level you completed in school?: 12th  Diabetic? Yes, last visit 01/28/2020. Counseled patient to keep all scheduled appointments for chronic condition management. Patient verbalized understanding.  Activities of Daily Living In your present state of health,  do you have any difficulty performing the following activities: 03/03/2020  Hearing? N  Vision? N  Difficulty concentrating or making decisions? N  Walking or climbing stairs? N  Dressing or bathing? N  Doing errands, shopping? N  Preparing Food and eating ? N  Using the Toilet? N  In the past six months, have you accidently leaked urine? N  Do you have problems with loss of bowel control? N  Managing your Medications? N  Managing your Finances? N  Housekeeping or managing your Housekeeping? N  Some recent data might be hidden    Patient Care Team: Ladell Pier, MD as PCP - General (Internal Medicine) Clent Jacks, MD as Consulting Physician (Ophthalmologist)  Indicate any recent Medical Services you may have received from other than [facility] providers in the past year (date may be approximate).     Assessment:   This is a routine wellness examination for Rhonda Miles.  Hearing/Vision screen No exam data present   Last eye examination on 12/30/2019 with Dr. Katy Fitch. Reports she has a visit scheduled in December 2021. Reports she has history of both glaucoma and cataracts.   Hearing normal bilateral.   Dietary issues and exercise activities discussed: Current Exercise Habits: Home exercise routine, Type of exercise: walking, Time (Minutes): 35, Frequency (Times/Week): 5, Weekly Exercise  (Minutes/Week): 175, Intensity: Moderate  Wt Readings from Last 3 Encounters:  03/03/20 228 lb 3.2 oz (103.5 kg)  01/28/20 226 lb 3.2 oz (102.6 kg)  08/28/19 234 lb 12.8 oz (106.5 kg)   Goals    . Exercise 4x per week (30 min per time)    . HEMOGLOBIN A1C < 7.0    . Weight (lb) < 200 lb (90.7 kg)      Depression Screen PHQ 2/9 Scores 03/03/2020 01/28/2020 08/28/2019 09/06/2017 01/03/2017 05/29/2016 02/20/2016  PHQ - 2 Score 0 0 0 0 0 0 0  PHQ- 9 Score - - - - 0 0 0    Fall Risk Fall Risk  03/03/2020 08/28/2019 09/06/2017 01/03/2017 05/29/2016  Falls in the past year? 0 0 No No No  Number falls in past yr: 0 - - - -  Injury with Fall? 0 - - - -   Any stairs in or around the home? No  If so, are there any without handrails? not applicable  Home free of loose throw rugs in walkways, pet beds, electrical cords, etc? Yes  Adequate lighting in your home to reduce risk of falls? Yes   ASSISTIVE DEVICES UTILIZED TO PREVENT FALLS:  Life alert? No  Use of a cane, walker or w/c? No  Grab bars in the bathroom? No  Shower chair or bench in shower? No  Elevated toilet seat or a handicapped toilet? No   TIMED UP AND GO: Was the test performed? Yes .  Length of time to ambulate 10 feet: 9 sec.   Gait steady and fast without use of assistive device   Cognitive Function: MMSE - Mini Mental State Exam 03/03/2020  Orientation to time 5  Orientation to Place 5  Registration 3  Attention/ Calculation 5  Recall 2  Language- name 2 objects 2  Language- repeat 1  Language- follow 3 step command 3  Language- read & follow direction 1  Write a sentence 1  Copy design 1  Total score 29   Immunizations Immunization History  Administered Date(s) Administered  . Influenza,inj,Quad PF,6+ Mos 04/27/2014, 05/10/2015, 05/29/2016, 06/07/2017, 03/20/2018, 04/07/2019  . PFIZER SARS-COV-2 Vaccination 08/06/2019, 08/27/2019  .  Pneumococcal Conjugate-13 06/26/2018  . Pneumococcal Polysaccharide-23 04/27/2014   . Tdap 03/29/2015   TDAP status: Up to date Flu Vaccine status: Up to date Pneumococcal vaccine status: Completed during today's visit. ordered during today's visit, patient will come back for administration Covid-19 vaccine status: Completed vaccines  Qualifies for Shingles Vaccine? Yes   Zostavax completed No   Shingrix Completed?: Yes ordered during today's visit, patient will come back for administration  Screening Tests Health Maintenance  Topic Date Due  . DEXA SCAN  Never done  . PNA vac Low Risk Adult (2 of 2 - PPSV23) 06/27/2019  . INFLUENZA VACCINE  02/21/2020  . OPHTHALMOLOGY EXAM  06/30/2020  . HEMOGLOBIN A1C  07/30/2020  . FOOT EXAM  08/27/2020  . MAMMOGRAM  02/08/2022  . COLONOSCOPY  10/08/2023  . TETANUS/TDAP  03/28/2025  . COVID-19 Vaccine  Completed  . Hepatitis C Screening  Completed    Health Maintenance  Health Maintenance Due  Topic Date Due  . DEXA SCAN  Never done  . PNA vac Low Risk Adult (2 of 2 - PPSV23) 06/27/2019  . INFLUENZA VACCINE  02/21/2020    Colorectal cancer screening: Completed 2015. Repeat every 10 years Mammogram status: Completed 2021. Repeat every year Bone Density status: Completed ordered during today's visit. Results reflect: Bone density results: NORMAL. Repeat every pending years. ordered during today's visit  Lung Cancer Screening: (Low Dose CT Chest recommended if Age 60-80 years, 30 pack-year currently smoking OR have quit w/in 15years.) does not qualify.   Lung Cancer Screening Referral: not applicable  Additional Screening:  Hepatitis C Screening: does not qualify; Completed 2017  Vision Screening: Recommended annual ophthalmology exams for early detection of glaucoma and other disorders of the eye. Is the patient up to date with their annual eye exam?  Yes  Who is the provider or what is the name of the office in which the patient attends annual eye exams? Dr. Katy Fitch If pt is not established with a provider, would  they like to be referred to a provider to establish care? not applicable.   Dental Screening: Recommended annual dental exams for proper oral hygiene. Patient requests referral to dentistry.    Community Resource Referral / Chronic Care Management: CRR required this visit?  No   CCM required this visit?  No   Plan:  1. Encounter for Medicare annual wellness exam: - Patient presents for Medicare annual wellness exam.  - Patient due for pneumonia vaccination. Reports she will return for administration.  - Pneumococcal polysaccharide vaccine 23-valent greater than or equal to 2yo subcutaneous/IM  2. Need for shingles vaccine: - Patient due for Shingrix vaccination. Reports she will return for administration. - Varicella-zoster vaccine IM (Shingrix)  3. Osteoporosis screening: - Bone Density for osteoporosis screening.  - DG Bone Density; Future  4. Encounter for dental exam and cleaning w/o abnormal findings: - Referral to Dentistry for annual dental exam.  - Ambulatory referral to Dentistry  I have personally reviewed and noted the following in the patient's chart:   . Medical and social history . Use of alcohol, tobacco or illicit drugs  . Current medications and supplements . Functional ability and status . Nutritional status . Physical activity . Advanced directives . List of other physicians . Hospitalizations, surgeries, and ER visits in previous 12 months . Vitals . Screenings to include cognitive, depression, and falls . Referrals and appointments  In addition, I have reviewed and discussed with patient certain preventive protocols, quality metrics, and  best practice recommendations. A written personalized care plan for preventive services as well as general preventive health recommendations were provided to patient.    Camillia Herter, NP   03/03/2020

## 2020-03-07 ENCOUNTER — Other Ambulatory Visit: Payer: Self-pay

## 2020-03-07 ENCOUNTER — Ambulatory Visit: Payer: Medicare Other | Attending: Internal Medicine | Admitting: Pharmacist

## 2020-03-07 DIAGNOSIS — Z23 Encounter for immunization: Secondary | ICD-10-CM

## 2020-03-07 NOTE — Progress Notes (Signed)
Patient presents for vaccination against strep pneumo and zosterper orders of Dr. Laural Benes. Consent given. Counseling provided. No contraindications exists. Vaccine administered without incident.   Butch Penny, PharmD, CPP Clinical Pharmacist [facility] [phone]

## 2020-04-05 ENCOUNTER — Other Ambulatory Visit: Payer: Self-pay

## 2020-04-05 ENCOUNTER — Ambulatory Visit: Payer: Medicare Other | Attending: Internal Medicine | Admitting: Internal Medicine

## 2020-04-05 ENCOUNTER — Ambulatory Visit: Payer: Medicare Other | Admitting: Internal Medicine

## 2020-04-05 ENCOUNTER — Ambulatory Visit (HOSPITAL_BASED_OUTPATIENT_CLINIC_OR_DEPARTMENT_OTHER): Payer: Medicare Other | Admitting: Pharmacist

## 2020-04-05 ENCOUNTER — Encounter: Payer: Self-pay | Admitting: Internal Medicine

## 2020-04-05 VITALS — BP 119/74 | HR 78 | Temp 98.4°F | Resp 16 | Wt 224.4 lb

## 2020-04-05 DIAGNOSIS — Z23 Encounter for immunization: Secondary | ICD-10-CM | POA: Diagnosis not present

## 2020-04-05 DIAGNOSIS — E669 Obesity, unspecified: Secondary | ICD-10-CM

## 2020-04-05 DIAGNOSIS — D509 Iron deficiency anemia, unspecified: Secondary | ICD-10-CM | POA: Diagnosis not present

## 2020-04-05 DIAGNOSIS — I10 Essential (primary) hypertension: Secondary | ICD-10-CM | POA: Diagnosis not present

## 2020-04-05 DIAGNOSIS — E1169 Type 2 diabetes mellitus with other specified complication: Secondary | ICD-10-CM

## 2020-04-05 DIAGNOSIS — E785 Hyperlipidemia, unspecified: Secondary | ICD-10-CM

## 2020-04-05 MED ORDER — METFORMIN HCL 500 MG PO TABS: ORAL_TABLET | ORAL | 2 refills | Status: DC

## 2020-04-05 MED ORDER — ATORVASTATIN CALCIUM 40 MG PO TABS: 40.0000 mg | ORAL_TABLET | Freq: Every day | ORAL | 2 refills | Status: DC

## 2020-04-05 MED ORDER — AMLODIPINE BESYLATE 10 MG PO TABS: 10.0000 mg | ORAL_TABLET | Freq: Every day | ORAL | 2 refills | Status: DC

## 2020-04-05 MED ORDER — GLIMEPIRIDE 2 MG PO TABS: 2.0000 mg | ORAL_TABLET | Freq: Every day | ORAL | 2 refills | Status: DC

## 2020-04-05 MED ORDER — LISINOPRIL 20 MG PO TABS: 20.0000 mg | ORAL_TABLET | Freq: Every day | ORAL | 2 refills | Status: DC

## 2020-04-05 NOTE — Progress Notes (Signed)
Patient presents for vaccination against influenza per orders of Dr. Johnson. Consent given. Counseling provided. No contraindications exists. Vaccine administered without incident.  ° °Luke Van Ausdall, PharmD, CPP °Clinical Pharmacist °[facility] °[phone] ° °

## 2020-04-05 NOTE — Progress Notes (Signed)
Patient ID: Rhonda Miles, female    DOB: 06-May-1951  MRN: [id number]  CC: Diabetes and Hypertension   Subjective: Rhonda Miles is a 69 y.o. female who presents for chronic ds management Her concerns today include:  Patient with history of DM type II, HL,IDA,obesity, HTN  DIABETES TYPE 2 Last A1C:   Lab Results  Component Value Date   HGBA1C 7.0 (H) 01/28/2020    Med Adherence:  '[x]'  Yes on Metformin and Amaryl   Medication side effects:  '[]'  Yes    '[x]'  No Home Monitoring?  '[x]'  Yes     Before BF and dinner Home glucose results range: 79-125 before dinner, mornings 88-115 Diet Adherence: '[x]'  Yes    '[]'  No Exercise: '[x]'  Yes  -walking daily but more so on Mon, Wed, Friday Hypoglycemic episodes?: '[]'  Yes    '[]'  No Numbness of the feet? '[]'  Yes    '[x]'  No Retinopathy hx? '[]'  Yes    '[x]'  No Last eye exam:  Comments: down 4 lbs since last mth  HYPERTENSION Currently taking: see medication list.  She is on lisinopril and amlodipine Med Adherence: '[x]'  Yes    '[]'  No Medication side effects: '[]'  Yes    '[x]'  No Adherence with salt restriction: '[x]'  Yes    '[]'  No Home Monitoring?: '[x]'  Yes    '[]'  No Monitoring Frequency: daily Home BP results range: this a.m was 126/74 SOB? '[]'  Yes    '[x]'  No Chest Pain?: '[]'  Yes    '[x]'  No Leg swelling?: '[]'  Yes    '[x]'  No Headaches?: '[]'  Yes    '[x]'  No Dizziness? '[]'  Yes    '[x]'  No Comments:   Anemia:  Taking iron 2x/wk. last CBC showed hemoglobin of 12 and hematocrit of 38.3.  HM:  Due for flu shot.  Will be due for 2nd Shingrix vaccine after 05/07/20.  Bone density scheduled for 06/14/20  Patient Active Problem List   Diagnosis Date Noted  . Iron deficiency anemia 08/28/2019  . Influenza vaccine needed 04/03/2019  . Hyperlipidemia associated with type 2 diabetes mellitus ([place]) 04/03/2019  . Essential hypertension 06/07/2017  . Chronic posterior anal fissure 03/26/2017  . Prolapsed internal hemorrhoids, grade 2 03/26/2017  . Glaucoma (increased eye  pressure) 07/12/2015  . Snoring 07/12/2015  . Bothersome menopausal vasomotor symptoms 06/03/2014  . Diabetes mellitus due to underlying condition without complications ([place]) 40/98/1191  . Obesity 02/13/2013     Current Outpatient Medications on File Prior to Visit  Medication Sig Dispense Refill  . AMBULATORY NON FORMULARY MEDICATION Diltiazem 2% gel mixed with the Lidocaine 5% Apply pea size amount inside rectum three times a day 30 g 3  . bisacodyl (DULCOLAX) 5 MG EC tablet Take 5 mg by mouth 2 (two) times daily as needed for moderate constipation.    . Blood Glucose Monitoring Suppl (ONETOUCH VERIO) w/Device KIT Use as directed to check blood sugar twice daily. E08.9 1 kit 0  . Blood Pressure Monitor DEVI Use as directed to check home blood pressure 2-3 times a week 1 Device 0  . cholecalciferol (VITAMIN D) 1000 UNITS tablet Take 1,000 Units by mouth daily.    . cyclobenzaprine (FLEXERIL) 5 MG tablet Take 1 tablet (5 mg total) by mouth daily as needed for muscle spasms. 30 tablet 0  . Ferrous Sulfate (IRON) 325 (65 Fe) MG TABS Take 1 tablet by mouth twice a week 90 tablet 0  . glucose blood (ONETOUCH VERIO) test strip Use as directed to  check blood sugar twice daily. E08.9 100 each 6  . OneTouch Delica Lancets 83J MISC Use as directed to check blood sugar twice daily. E08.9 100 each 6   No current facility-administered medications on file prior to visit.    Allergies  Allergen Reactions  . Asa [Aspirin] Other (See Comments)    shakey    Social History   Socioeconomic History  . Marital status: Married    Spouse name: Not on file  . Number of children: Not on file  . Years of education: Not on file  . Highest education level: Not on file  Occupational History  . Occupation: Physiological scientist: [employer]  Tobacco Use  . Smoking status: Former Smoker    Quit date: 10/22/1983    Years since quitting: 36.4  . Smokeless tobacco: Never Used  Vaping  Use  . Vaping Use: Never used  Substance and Sexual Activity  . Alcohol use: No  . Drug use: No  . Sexual activity: Yes    Partners: Male  Other Topics Concern  . Not on file  Social History Narrative   Personal care assistant   Married   Social Determinants of Health   Financial Resource Strain:   . Difficulty of Paying Living Expenses: Not on file  Food Insecurity:   . Worried About Charity fundraiser in the Last Year: Not on file  . Ran Out of Food in the Last Year: Not on file  Transportation Needs:   . Lack of Transportation (Medical): Not on file  . Lack of Transportation (Non-Medical): Not on file  Physical Activity:   . Days of Exercise per Week: Not on file  . Minutes of Exercise per Session: Not on file  Stress:   . Feeling of Stress : Not on file  Social Connections:   . Frequency of Communication with Friends and Family: Not on file  . Frequency of Social Gatherings with Friends and Family: Not on file  . Attends Religious Services: Not on file  . Active Member of Clubs or Organizations: Not on file  . Attends Archivist Meetings: Not on file  . Marital Status: Not on file  Intimate Partner Violence:   . Fear of Current or Ex-Partner: Not on file  . Emotionally Abused: Not on file  . Physically Abused: Not on file  . Sexually Abused: Not on file    Family History  Problem Relation Age of Onset  . Diabetes Father   . Cancer - Other Father        FROM CHEMICAL EXPOSURE  . Hypertension Mother   . Colon cancer Maternal Grandmother 9  . Breast cancer Maternal Aunt   . Stomach cancer Neg Hx   . Pancreatic cancer Neg Hx     Past Surgical History:  Procedure Laterality Date  . BILATERAL SALPINGOOPHORECTOMY  11/05/2002  . COLONOSCOPY  09/2013  . LACERATION REPAIR Right 1957   ankle from lawnmower accident  . LAPAROSCOPIC ASSISTED VAGINAL HYSTERECTOMY  11/05/2002   Dr. Matthew Saras, done for menorrhagia.  Benign pathology    ROS: Review of  Systems Negative except as stated above  PHYSICAL EXAM: BP 119/74   Pulse 78   Temp 98.4 F (36.9 C)   Resp 16   Wt 224 lb 6.4 oz (101.8 kg)   SpO2 97%   BMI 35.15 kg/m   Wt Readings from Last 3 Encounters:  04/05/20 224 lb 6.4 oz (101.8 kg)  03/03/20 228 lb 3.2 oz (103.5 kg)  01/28/20 226 lb 3.2 oz (102.6 kg)    Physical Exam  General appearance - alert, well appearing, and in no distress Mental status - normal mood, behavior, speech, dress, motor activity, and thought processes Neck - supple, no significant adenopathy Chest - clear to auscultation, no wheezes, rales or rhonchi, symmetric air entry Heart - normal rate, regular rhythm, normal S1, S2, no murmurs, rubs, clicks or gallops Extremities - peripheral pulses normal, no pedal edema, no clubbing or cyanosis Diabetic Foot Exam - Simple   Simple Foot Form Visual Inspection No deformities, no ulcerations, no other skin breakdown bilaterally: Yes Sensation Testing Intact to touch and monofilament testing bilaterally: Yes Pulse Check Posterior Tibialis and Dorsalis pulse intact bilaterally: Yes Comments      CMP Latest Ref Rng & Units 01/28/2020 09/04/2019 06/26/2018  Glucose 65 - 99 mg/dL 76 185(H) 95  BUN 8 - 27 mg/dL '13 10 10  ' Creatinine 0.57 - 1.00 mg/dL 0.57 0.68 0.73  Sodium 134 - 144 mmol/L 140 140 141  Potassium 3.5 - 5.2 mmol/L 4.2 4.1 4.3  Chloride 96 - 106 mmol/L 101 101 100  CO2 20 - 29 mmol/L '25 27 23  ' Calcium 8.7 - 10.3 mg/dL 9.4 9.6 9.7  Total Protein 6.0 - 8.5 g/dL - 7.2 7.1  Total Bilirubin 0.0 - 1.2 mg/dL - 0.5 0.4  Alkaline Phos 39 - 117 IU/L - 84 91  AST 0 - 40 IU/L - 16 22  ALT 0 - 32 IU/L - 29 30   Lipid Panel     Component Value Date/Time   CHOL 88 (L) 09/04/2019 1619   TRIG 52 09/04/2019 1619   HDL 35 (L) 09/04/2019 1619   CHOLHDL 2.5 09/04/2019 1619   CHOLHDL 2.9 10/31/2015 1245   VLDL 15 10/31/2015 1245   LDLCALC 40 09/04/2019 1619    CBC    Component Value Date/Time   WBC  6.5 01/28/2020 1611   WBC 5.3 10/31/2015 1245   RBC 4.53 01/28/2020 1611   RBC 4.85 10/31/2015 1245   HGB 12.1 01/28/2020 1611   HCT 38.3 01/28/2020 1611   PLT 322 01/28/2020 1611   MCV 85 01/28/2020 1611   MCH 26.7 01/28/2020 1611   MCH 24.1 (L) 10/31/2015 1245   MCHC 31.6 01/28/2020 1611   MCHC 31.3 (L) 10/31/2015 1245   RDW 14.3 01/28/2020 1611   LYMPHSABS 2.4 10/26/2013 1154   MONOABS 0.3 10/26/2013 1154   EOSABS 0.1 10/26/2013 1154   BASOSABS 0.0 10/26/2013 1154    ASSESSMENT AND PLAN: 1. Type 2 diabetes mellitus with obesity (HCC) At goal.  Continue healthy eating habits, regular exercise on her current medications. - glimepiride (AMARYL) 2 MG tablet; Take 1 tablet (2 mg total) by mouth daily before breakfast. Dose increase  Dispense: 90 tablet; Refill: 2 - metFORMIN (GLUCOPHAGE) 500 MG tablet; Take 2 tablets by mouth after breakfast, 1 tablet by mouth after lunch, and 2 tablets by mouth after dinner.  Dispense: 450 tablet; Refill: 2  2. Essential hypertension At goal.  Continue current medications and DASH diet - amLODipine (NORVASC) 10 MG tablet; Take 1 tablet (10 mg total) by mouth daily.  Dispense: 90 tablet; Refill: 2 - lisinopril (ZESTRIL) 20 MG tablet; Take 1 tablet (20 mg total) by mouth daily.  Dispense: 90 tablet; Refill: 2  3. Hyperlipidemia associated with type 2 diabetes mellitus ([place]) On last lipid profile done earlier this year, LDL was 40 which is at  goal. - atorvastatin (LIPITOR) 40 MG tablet; Take 1 tablet (40 mg total) by mouth daily.  Dispense: 90 tablet; Refill: 2  4. Iron deficiency anemia, unspecified iron deficiency anemia type Continue taking iron supplement twice a week.  5. Need for influenza vaccination Given today.     Patient was given the opportunity to ask questions.  Patient verbalized understanding of the plan and was able to repeat key elements of the plan.   No orders of the defined types were placed in this  encounter.    Requested Prescriptions   Signed Prescriptions Disp Refills  . amLODipine (NORVASC) 10 MG tablet 90 tablet 2    Sig: Take 1 tablet (10 mg total) by mouth daily.  Marland Kitchen glimepiride (AMARYL) 2 MG tablet 90 tablet 2    Sig: Take 1 tablet (2 mg total) by mouth daily before breakfast. Dose increase  . lisinopril (ZESTRIL) 20 MG tablet 90 tablet 2    Sig: Take 1 tablet (20 mg total) by mouth daily.  Marland Kitchen atorvastatin (LIPITOR) 40 MG tablet 90 tablet 2    Sig: Take 1 tablet (40 mg total) by mouth daily.  . metFORMIN (GLUCOPHAGE) 500 MG tablet 450 tablet 2    Sig: Take 2 tablets by mouth after breakfast, 1 tablet by mouth after lunch, and 2 tablets by mouth after dinner.    Return in about 4 months (around 08/05/2020).  Karle Plumber, MD, FACP

## 2020-04-26 ENCOUNTER — Encounter: Payer: Self-pay | Admitting: Internal Medicine

## 2020-04-26 ENCOUNTER — Other Ambulatory Visit: Payer: Self-pay | Admitting: Family

## 2020-04-26 ENCOUNTER — Other Ambulatory Visit: Payer: Self-pay | Admitting: Pharmacist

## 2020-04-26 DIAGNOSIS — E669 Obesity, unspecified: Secondary | ICD-10-CM

## 2020-04-26 DIAGNOSIS — I10 Essential (primary) hypertension: Secondary | ICD-10-CM

## 2020-04-26 MED ORDER — AMLODIPINE BESYLATE 10 MG PO TABS: 10.0000 mg | ORAL_TABLET | Freq: Every day | ORAL | 2 refills | Status: DC

## 2020-05-09 ENCOUNTER — Ambulatory Visit: Payer: Medicare Other | Attending: Internal Medicine | Admitting: Pharmacist

## 2020-05-09 ENCOUNTER — Other Ambulatory Visit: Payer: Self-pay

## 2020-05-09 DIAGNOSIS — Z23 Encounter for immunization: Secondary | ICD-10-CM | POA: Diagnosis not present

## 2020-05-09 NOTE — Progress Notes (Signed)
Patient presents for vaccination against zoster per orders of Dr. Johnson. Consent given. Counseling provided. No contraindications exists. Vaccine administered without incident.   Luke Van Ausdall, PharmD, CPP Clinical Pharmacist [facility] [phone]  

## 2020-06-14 ENCOUNTER — Other Ambulatory Visit: Payer: Self-pay

## 2020-06-14 ENCOUNTER — Ambulatory Visit
Admission: RE | Admit: 2020-06-14 | Discharge: 2020-06-14 | Disposition: A | Discharge status: Home / Self Care | Payer: Medicare Other | Source: Ambulatory Visit | Attending: Family | Admitting: Family

## 2020-06-14 DIAGNOSIS — Z780 Asymptomatic menopausal state: Secondary | ICD-10-CM | POA: Diagnosis not present

## 2020-06-14 DIAGNOSIS — Z13820 Encounter for screening for osteoporosis: Secondary | ICD-10-CM

## 2020-06-14 DIAGNOSIS — M85851 Other specified disorders of bone density and structure, right thigh: Secondary | ICD-10-CM | POA: Diagnosis not present

## 2020-06-20 NOTE — Addendum Note (Signed)
Addended by: Rema Fendt on: 06/20/2020 12:35 PM   Modules accepted: Orders

## 2020-06-20 NOTE — Progress Notes (Signed)
Please call patient with update.   Bone density shows your bones are as thick as normal.   Please come in to the lab to have vitamin D level and calcium checked. Once labs have resulted will call with an update.

## 2020-06-23 ENCOUNTER — Telehealth: Payer: Self-pay | Admitting: *Deleted

## 2020-06-23 NOTE — Telephone Encounter (Signed)
Patient name and DOB verified.  Aware that the dose is 400 IU, OTC. Validated with Clinical Pharmacist. Encouraged to ask for assistance when in the there drug store with finding the dose. Patient verbalized understanding.

## 2020-06-23 NOTE — Telephone Encounter (Signed)
Copied from CRM [id number]. Topic: General - Other >> Jun 23, 2020 10:19 AM Marylen Ponto wrote: Reason for CRM: Pt called to request clarification on taking otc vitamin d that was discussed on yesterday. Pt requests call back

## 2020-07-11 ENCOUNTER — Other Ambulatory Visit: Payer: Self-pay | Admitting: Family

## 2020-07-11 DIAGNOSIS — E669 Obesity, unspecified: Secondary | ICD-10-CM

## 2020-07-11 DIAGNOSIS — E1169 Type 2 diabetes mellitus with other specified complication: Secondary | ICD-10-CM

## 2020-07-17 DIAGNOSIS — J069 Acute upper respiratory infection, unspecified: Secondary | ICD-10-CM | POA: Diagnosis not present

## 2020-07-20 DIAGNOSIS — Z1152 Encounter for screening for COVID-19: Secondary | ICD-10-CM | POA: Diagnosis not present

## 2020-07-21 DIAGNOSIS — Z1152 Encounter for screening for COVID-19: Secondary | ICD-10-CM | POA: Diagnosis not present

## 2020-08-05 ENCOUNTER — Other Ambulatory Visit: Payer: Self-pay

## 2020-08-05 ENCOUNTER — Ambulatory Visit: Payer: Medicare Other | Attending: Internal Medicine | Admitting: Internal Medicine

## 2020-08-05 DIAGNOSIS — E1169 Type 2 diabetes mellitus with other specified complication: Secondary | ICD-10-CM | POA: Diagnosis not present

## 2020-08-05 DIAGNOSIS — E669 Obesity, unspecified: Secondary | ICD-10-CM | POA: Diagnosis not present

## 2020-08-05 DIAGNOSIS — I10 Essential (primary) hypertension: Secondary | ICD-10-CM

## 2020-08-05 DIAGNOSIS — M85851 Other specified disorders of bone density and structure, right thigh: Secondary | ICD-10-CM

## 2020-08-05 NOTE — Progress Notes (Signed)
Virtual Visit via Telephone Note  I connected with Rhonda Miles on 08/05/20 at 4:55 p.m by telephone and verified that I am speaking with the correct person using two identifiers.  Location: Patient: home Provider: office  The patient, my CMA Ms. Rhonda Miles and myself participated in this visit. I discussed the limitations, risks, security and privacy concerns of performing an evaluation and management service by telephone and the availability of in person appointments. I also discussed with the patient that there may be a patient responsible charge related to this service. The patient expressed understanding and agreed to proceed.   History of Present Illness: Patient with history of DM type II, HL,IDA,obesity, HTN  Had laryngitis week of Christmas.  Seen at [facility]. COVID-19 testing was negative.  Symptoms have resolved.  DM: doing well BS this a.m was 115.  Checks BS BID.  Range 89-128.  Only one episode of BS of 69 but she did feel it and was able to eat something.  Compliant with meds Metformin and Amaryl.  Doing well with her eating habits. Not walking as much as she use to but getting in 4000 steps a day Last wgh 222 lbs Has appt with eye doctor 08/18/2020 with Dr. Schuyler Miles  HTN: Recent BP 128/71 Limits salt in foods.  No chest pains or shortness of breath.  No lower extremity swelling.  Reports compliance with lisinopril and amlodipine.  Anemia - taking iron 2 xwk  Osteopenia: Had bone mineral density study done in November.  This revealed osteopenia.  I recommended purchasing vitamin D 400 iu over-the-counter and taking 1 daily.  Patient states that she is doing that.    Outpatient Encounter Medications as of 08/05/2020  Medication Sig Note  . AMBULATORY NON FORMULARY MEDICATION Diltiazem 2% gel mixed with the Lidocaine 5% Apply pea size amount inside rectum three times a day   . amLODipine (NORVASC) 10 MG tablet Take 1 tablet (10 mg total) by mouth daily.   Marland Kitchen atorvastatin  (LIPITOR) 40 MG tablet Take 1 tablet (40 mg total) by mouth daily.   . bisacodyl (DULCOLAX) 5 MG EC tablet Take 5 mg by mouth 2 (two) times daily as needed for moderate constipation.   . Blood Glucose Monitoring Suppl (ONETOUCH VERIO) w/Device KIT Use as directed to check blood sugar twice daily. E08.9   . Blood Pressure Monitor DEVI Use as directed to check home blood pressure 2-3 times a week   . cholecalciferol (VITAMIN D) 1000 UNITS tablet Take 1,000 Units by mouth daily.   . cyclobenzaprine (FLEXERIL) 5 MG tablet Take 1 tablet (5 mg total) by mouth daily as needed for muscle spasms. 03/03/2020: prn  . Ferrous Sulfate (IRON) 325 (65 Fe) MG TABS Take 1 tablet by mouth twice a week   . glimepiride (AMARYL) 2 MG tablet TAKE 1 TABLET BY MOUTH ONCE DAILY BEFORE BREAKFAST DOSE  INCREASE   . glucose blood (ONETOUCH VERIO) test strip Use as directed to check blood sugar twice daily. E08.9   . lisinopril (ZESTRIL) 20 MG tablet Take 1 tablet by mouth once daily   . metFORMIN (GLUCOPHAGE) 500 MG tablet TAKE 2 TABLETS BY MOUTH AFTER BREAKFAST, 1 TABLET BY MOUTH AFTER LUNCH, AND 2 TABLETS BY MOUTH AFTER DINNER   . OneTouch Delica Lancets 13K MISC Use as directed to check blood sugar twice daily. E08.9    No facility-administered encounter medications on file as of 08/05/2020.      Observations/Objective: No direct observation done as this was  a telephone encounter.  Assessment and Plan: 1. Type 2 diabetes mellitus with obesity (HCC) Blood sugars at goal.  Continue current oral medications, healthy eating habits and regular exercise. - Hemoglobin A1c; Future - Lipid panel; Future  2. Essential hypertension Reported blood pressure reading at goal.  She will continue lisinopril and amlodipine.  3. Osteopenia of neck of right femur Continue exercising several days a week.  Continue vitamin D supplement.   Follow Up Instructions: 4 months.   I discussed the assessment and treatment plan with the  patient. The patient was provided an opportunity to ask questions and all were answered. The patient agreed with the plan and demonstrated an understanding of the instructions.   The patient was advised to call back or seek an in-person evaluation if the symptoms worsen or if the condition fails to improve as anticipated.  I provided 11 minutes of non-face-to-face time during this encounter.   Karle Plumber, MD

## 2020-08-18 DIAGNOSIS — H401131 Primary open-angle glaucoma, bilateral, mild stage: Secondary | ICD-10-CM | POA: Diagnosis not present

## 2020-08-18 DIAGNOSIS — E119 Type 2 diabetes mellitus without complications: Secondary | ICD-10-CM | POA: Diagnosis not present

## 2020-08-18 DIAGNOSIS — H2513 Age-related nuclear cataract, bilateral: Secondary | ICD-10-CM | POA: Diagnosis not present

## 2020-08-18 LAB — HM DIABETES EYE EXAM

## 2020-09-23 ENCOUNTER — Other Ambulatory Visit: Payer: Self-pay | Admitting: Internal Medicine

## 2020-09-23 DIAGNOSIS — D509 Iron deficiency anemia, unspecified: Secondary | ICD-10-CM

## 2020-09-23 MED ORDER — IRON 325 (65 FE) MG PO TABS
ORAL_TABLET | ORAL | 0 refills | Status: DC
Start: 2020-09-23 — End: 2021-12-07

## 2020-09-23 NOTE — Telephone Encounter (Signed)
Will forward to provider  

## 2020-10-06 ENCOUNTER — Encounter: Payer: Self-pay | Admitting: Internal Medicine

## 2020-10-07 ENCOUNTER — Other Ambulatory Visit: Payer: Self-pay | Admitting: Internal Medicine

## 2020-10-07 DIAGNOSIS — E1169 Type 2 diabetes mellitus with other specified complication: Secondary | ICD-10-CM

## 2020-10-13 ENCOUNTER — Encounter: Payer: Self-pay | Admitting: Internal Medicine

## 2020-10-13 DIAGNOSIS — E1169 Type 2 diabetes mellitus with other specified complication: Secondary | ICD-10-CM

## 2020-10-13 DIAGNOSIS — E669 Obesity, unspecified: Secondary | ICD-10-CM

## 2020-10-14 ENCOUNTER — Other Ambulatory Visit: Payer: Self-pay | Admitting: Internal Medicine

## 2020-10-14 DIAGNOSIS — E669 Obesity, unspecified: Secondary | ICD-10-CM

## 2020-10-14 DIAGNOSIS — E1169 Type 2 diabetes mellitus with other specified complication: Secondary | ICD-10-CM

## 2020-10-14 MED ORDER — METFORMIN HCL 500 MG PO TABS
ORAL_TABLET | ORAL | 2 refills | Status: DC
Start: 2020-10-14 — End: 2020-10-14

## 2020-10-14 MED ORDER — METFORMIN HCL 500 MG PO TABS: ORAL_TABLET | ORAL | 2 refills | Status: DC

## 2020-11-02 ENCOUNTER — Encounter: Payer: Self-pay | Admitting: Internal Medicine

## 2020-11-02 ENCOUNTER — Other Ambulatory Visit: Payer: Self-pay

## 2020-11-02 DIAGNOSIS — I10 Essential (primary) hypertension: Secondary | ICD-10-CM

## 2020-11-02 MED ORDER — LISINOPRIL 20 MG PO TABS: 1.0000 | ORAL_TABLET | Freq: Every day | ORAL | 1 refills | Status: DC

## 2020-11-30 ENCOUNTER — Other Ambulatory Visit: Payer: Self-pay | Admitting: Internal Medicine

## 2020-11-30 ENCOUNTER — Other Ambulatory Visit: Payer: Self-pay

## 2020-11-30 ENCOUNTER — Ambulatory Visit: Payer: Medicare Other | Attending: Internal Medicine

## 2020-11-30 ENCOUNTER — Encounter: Payer: Self-pay | Admitting: Internal Medicine

## 2020-11-30 DIAGNOSIS — E1169 Type 2 diabetes mellitus with other specified complication: Secondary | ICD-10-CM

## 2020-11-30 DIAGNOSIS — E669 Obesity, unspecified: Secondary | ICD-10-CM

## 2020-12-01 LAB — LIPID PANEL
Chol/HDL Ratio: 2.3 ratio (ref 0.0–4.4)
Cholesterol, Total: 116 mg/dL (ref 100–199)
HDL: 51 mg/dL (ref >39)
LDL Chol Calc (NIH): 52 mg/dL (ref 0–99)
Triglycerides: 59 mg/dL (ref 0–149)
VLDL Cholesterol Cal: 13 mg/dL (ref 5–40)

## 2020-12-01 LAB — HEMOGLOBIN A1C
Est. average glucose Bld gHb Est-mCnc: 148 mg/dL
Hgb A1c MFr Bld: 6.8 % — ABNORMAL HIGH (ref 4.8–5.6)

## 2020-12-06 ENCOUNTER — Other Ambulatory Visit: Payer: Self-pay

## 2020-12-06 ENCOUNTER — Ambulatory Visit: Payer: Medicare Other | Attending: Internal Medicine | Admitting: Internal Medicine

## 2020-12-06 ENCOUNTER — Encounter: Payer: Self-pay | Admitting: Internal Medicine

## 2020-12-06 VITALS — BP 134/79 | HR 84 | Resp 16 | Wt 224.8 lb

## 2020-12-06 DIAGNOSIS — E1169 Type 2 diabetes mellitus with other specified complication: Secondary | ICD-10-CM | POA: Diagnosis not present

## 2020-12-06 DIAGNOSIS — E785 Hyperlipidemia, unspecified: Secondary | ICD-10-CM

## 2020-12-06 DIAGNOSIS — I10 Essential (primary) hypertension: Secondary | ICD-10-CM | POA: Diagnosis not present

## 2020-12-06 DIAGNOSIS — R600 Localized edema: Secondary | ICD-10-CM

## 2020-12-06 DIAGNOSIS — E669 Obesity, unspecified: Secondary | ICD-10-CM

## 2020-12-06 NOTE — Progress Notes (Signed)
Patient ID: Rhonda Miles, female    DOB: 08-30-1950  MRN: [id number]  CC: Diabetes and Hypertension   Subjective: Rhonda Miles is a 70 y.o. female who presents for chronic ds management Her concerns today include:  Patient with history of DM type II, HL,IDA,obesity, HTN, osteopenia  DM: Lab Results  Component Value Date   HGBA1C 6.8 (H) 11/30/2020  -Reports compliance with taking metformin and Amaryl but admits that she forgets to take Metformin in evenings on wkends -checks BS twice a day.  Range 80-112.  7 day average 90-110 -doing good with eating habits Walking a little and does stretching exercises while sitting.   HTN: Reports compliance with taking lisinopril and Norvasc.  Checks blood pressure regularly at home.  Range has been 110-120 over 60s.  She limits salt in the foods.  No chest pains or shortness of breath.  No PND orthopnea.  The legs feel tight sometimes when she is sitting down. No pain in the legs with ambulation. Had ABI done 11/12/2020 by visiting nurse practitioner to her home from her insurance.  Was 0.89 on the right and 1.09 on the left.  HL: Compliant with atorvastatin Patient Active Problem List   Diagnosis Date Noted  . Iron deficiency anemia 08/28/2019  . Influenza vaccine needed 04/03/2019  . Hyperlipidemia associated with type 2 diabetes mellitus ([place]) 04/03/2019  . Essential hypertension 06/07/2017  . Chronic posterior anal fissure 03/26/2017  . Prolapsed internal hemorrhoids, grade 2 03/26/2017  . Glaucoma (increased eye pressure) 07/12/2015  . Snoring 07/12/2015  . Bothersome menopausal vasomotor symptoms 06/03/2014  . Diabetes mellitus due to underlying condition without complications ([place]) 67/34/1937  . Obesity 02/13/2013     Current Outpatient Medications on File Prior to Visit  Medication Sig Dispense Refill  . AMBULATORY NON FORMULARY MEDICATION Diltiazem 2% gel mixed with the Lidocaine 5% Apply pea size amount inside  rectum three times a day (Patient not taking: Reported on 12/06/2020) 30 g 3  . amLODipine (NORVASC) 10 MG tablet Take 1 tablet (10 mg total) by mouth daily. 90 tablet 2  . atorvastatin (LIPITOR) 40 MG tablet Take 1 tablet by mouth once daily 90 tablet 0  . bisacodyl (DULCOLAX) 5 MG EC tablet Take 5 mg by mouth 2 (two) times daily as needed for moderate constipation. (Patient not taking: Reported on 12/06/2020)    . Blood Glucose Monitoring Suppl (ONETOUCH VERIO) w/Device KIT Use as directed to check blood sugar twice daily. E08.9 1 kit 0  . Blood Pressure Monitor DEVI Use as directed to check home blood pressure 2-3 times a week 1 Device 0  . cholecalciferol (VITAMIN D) 1000 UNITS tablet Take 1,000 Units by mouth daily.    . cyclobenzaprine (FLEXERIL) 5 MG tablet Take 1 tablet (5 mg total) by mouth daily as needed for muscle spasms. (Patient not taking: Reported on 12/06/2020) 30 tablet 0  . Ferrous Sulfate (IRON) 325 (65 Fe) MG TABS Take 1 tablet by mouth twice a week 90 tablet 0  . glimepiride (AMARYL) 2 MG tablet TAKE 1 TABLET BY MOUTH ONCE DAILY BEFORE BREAKFAST DOSE  INCREASE 90 tablet 1  . glucose blood (ONETOUCH VERIO) test strip Use as directed to check blood sugar twice daily. E08.9 100 each 6  . lisinopril (ZESTRIL) 20 MG tablet Take 1 tablet (20 mg total) by mouth daily. 90 tablet 1  . metFORMIN (GLUCOPHAGE) 500 MG tablet TAKE 2 TABLETS BY MOUTH AFTER BREAKFAST AND TAKE 1 TABLET BY MOUTH  AFTER LUNCH AND 2 TABLETS AFTER DINNER 450 tablet 2  . OneTouch Delica Lancets 48J MISC Use as directed to check blood sugar twice daily. E08.9 100 each 6   No current facility-administered medications on file prior to visit.    Allergies  Allergen Reactions  . Asa [Aspirin] Other (See Comments)    shakey    Social History   Socioeconomic History  . Marital status: Single    Spouse name: Not on file  . Number of children: Not on file  . Years of education: Not on file  . Highest education  level: Not on file  Occupational History  . Occupation: Physiological scientist: [employer]  Tobacco Use  . Smoking status: Former Smoker    Quit date: 10/22/1983    Years since quitting: 37.1  . Smokeless tobacco: Never Used  Vaping Use  . Vaping Use: Never used  Substance and Sexual Activity  . Alcohol use: No  . Drug use: No  . Sexual activity: Yes    Partners: Male  Other Topics Concern  . Not on file  Social History Narrative   Personal care assistant   Married   Social Determinants of Health   Financial Resource Strain: Not on file  Food Insecurity: Not on file  Transportation Needs: Not on file  Physical Activity: Not on file  Stress: Not on file  Social Connections: Not on file  Intimate Partner Violence: Not on file    Family History  Problem Relation Age of Onset  . Diabetes Father   . Cancer - Other Father        FROM CHEMICAL EXPOSURE  . Hypertension Mother   . Colon cancer Maternal Grandmother 81  . Breast cancer Maternal Aunt   . Stomach cancer Neg Hx   . Pancreatic cancer Neg Hx     Past Surgical History:  Procedure Laterality Date  . BILATERAL SALPINGOOPHORECTOMY  11/05/2002  . COLONOSCOPY  09/2013  . LACERATION REPAIR Right 1957   ankle from lawnmower accident  . LAPAROSCOPIC ASSISTED VAGINAL HYSTERECTOMY  11/05/2002   Dr. Matthew Saras, done for menorrhagia.  Benign pathology    ROS: Review of Systems Negative except as stated above  PHYSICAL EXAM: BP 134/79   Pulse 84   Resp 16   Wt 224 lb 12.8 oz (102 kg)   SpO2 97%   BMI 35.21 kg/m   Wt Readings from Last 3 Encounters:  12/06/20 224 lb 12.8 oz (102 kg)  04/05/20 224 lb 6.4 oz (101.8 kg)  03/03/20 228 lb 3.2 oz (103.5 kg)    Physical Exam  General appearance - alert, well appearing, and in no distress Mental status - normal mood, behavior, speech, dress, motor activity, and thought processes Neck - supple, no significant adenopathy Chest - clear to  auscultation, no wheezes, rales or rhonchi, symmetric air entry Heart - normal rate, regular rhythm, normal S1, S2, no murmurs, rubs, clicks or gallops Extremities -trace to 1+ bilateral lower extremity and ankle edema Diabetic Foot Exam - Simple   Simple Foot Form Visual Inspection No deformities, no ulcerations, no other skin breakdown bilaterally: Yes Sensation Testing Intact to touch and monofilament testing bilaterally: Yes Pulse Check Posterior Tibialis and Dorsalis pulse intact bilaterally: Yes Comments     CMP Latest Ref Rng & Units 01/28/2020 09/04/2019 06/26/2018  Glucose 65 - 99 mg/dL 76 185(H) 95  BUN 8 - 27 mg/dL '13 10 10  ' Creatinine 0.57 - 1.00 mg/dL  0.57 0.68 0.73  Sodium 134 - 144 mmol/L 140 140 141  Potassium 3.5 - 5.2 mmol/L 4.2 4.1 4.3  Chloride 96 - 106 mmol/L 101 101 100  CO2 20 - 29 mmol/L '25 27 23  ' Calcium 8.7 - 10.3 mg/dL 9.4 9.6 9.7  Total Protein 6.0 - 8.5 g/dL - 7.2 7.1  Total Bilirubin 0.0 - 1.2 mg/dL - 0.5 0.4  Alkaline Phos 39 - 117 IU/L - 84 91  AST 0 - 40 IU/L - 16 22  ALT 0 - 32 IU/L - 29 30   Lipid Panel     Component Value Date/Time   CHOL 116 11/30/2020 1625   TRIG 59 11/30/2020 1625   HDL 51 11/30/2020 1625   CHOLHDL 2.3 11/30/2020 1625   CHOLHDL 2.9 10/31/2015 1245   VLDL 15 10/31/2015 1245   LDLCALC 52 11/30/2020 1625    CBC    Component Value Date/Time   WBC 6.5 01/28/2020 1611   WBC 5.3 10/31/2015 1245   RBC 4.53 01/28/2020 1611   RBC 4.85 10/31/2015 1245   HGB 12.1 01/28/2020 1611   HCT 38.3 01/28/2020 1611   PLT 322 01/28/2020 1611   MCV 85 01/28/2020 1611   MCH 26.7 01/28/2020 1611   MCH 24.1 (L) 10/31/2015 1245   MCHC 31.6 01/28/2020 1611   MCHC 31.3 (L) 10/31/2015 1245   RDW 14.3 01/28/2020 1611   LYMPHSABS 2.4 10/26/2013 1154   MONOABS 0.3 10/26/2013 1154   EOSABS 0.1 10/26/2013 1154   BASOSABS 0.0 10/26/2013 1154    ASSESSMENT AND PLAN: 1. Type 2 diabetes mellitus with obesity ([place]) Commended her on trying  to eat healthy and good diabetes control.  Encouraged her to try to move a bit more to achieve weight loss.  Continue metformin and Amaryl. - POCT glucose (manual entry)  2. Essential hypertension Blood pressure today close to goal.  She reports good home blood pressure reading.  No changes made to medications today.  3. Hyperlipidemia associated with type 2 diabetes mellitus (HCC) LDL at goal.  Continue atorvastatin  4. Edema of both lower legs Discussed stopping the Norvasc and changing to hydrochlorothiazide instead.  However patient declined stating that she already urinates a lot and would not want to be on a medication that would cause her to urinate even more. -Recommend wearing compression socks during the day when she is up and active. -Patient without symptoms of PAD.  ABI mildly abnormal on the right.  We will observe for now.   Patient was given the opportunity to ask questions.  Patient verbalized understanding of the plan and was able to repeat key elements of the plan.   Orders Placed This Encounter  Procedures  . POCT glucose (manual entry)     Requested Prescriptions    No prescriptions requested or ordered in this encounter    Return in about 4 months (around 04/08/2021).  Karle Plumber, MD, FACP

## 2020-12-07 LAB — GLUCOSE, POCT (MANUAL RESULT ENTRY): POC Glucose: 85 mg/dl (ref 70–99)

## 2020-12-11 ENCOUNTER — Encounter: Payer: Self-pay | Admitting: Internal Medicine

## 2020-12-11 DIAGNOSIS — E669 Obesity, unspecified: Secondary | ICD-10-CM

## 2020-12-11 DIAGNOSIS — E1169 Type 2 diabetes mellitus with other specified complication: Secondary | ICD-10-CM

## 2020-12-12 ENCOUNTER — Other Ambulatory Visit: Payer: Self-pay

## 2020-12-12 DIAGNOSIS — E669 Obesity, unspecified: Secondary | ICD-10-CM

## 2020-12-12 DIAGNOSIS — E1169 Type 2 diabetes mellitus with other specified complication: Secondary | ICD-10-CM

## 2020-12-12 MED ORDER — GLIMEPIRIDE 2 MG PO TABS: ORAL_TABLET | ORAL | 1 refills | Status: DC

## 2020-12-22 ENCOUNTER — Encounter: Payer: Self-pay | Admitting: Internal Medicine

## 2020-12-23 ENCOUNTER — Ambulatory Visit: Payer: Medicare Other | Attending: Internal Medicine | Admitting: Internal Medicine

## 2020-12-23 ENCOUNTER — Other Ambulatory Visit: Payer: Self-pay

## 2020-12-23 DIAGNOSIS — M545 Low back pain, unspecified: Secondary | ICD-10-CM

## 2020-12-23 DIAGNOSIS — M5431 Sciatica, right side: Secondary | ICD-10-CM | POA: Diagnosis not present

## 2020-12-23 MED ORDER — TRAMADOL HCL 50 MG PO TABS: 50.0000 mg | ORAL_TABLET | Freq: Three times a day (TID) | ORAL | 0 refills | Status: AC | PRN

## 2020-12-23 MED ORDER — CYCLOBENZAPRINE HCL 5 MG PO TABS: 5.0000 mg | ORAL_TABLET | Freq: Every day | ORAL | 0 refills | Status: DC | PRN

## 2020-12-23 NOTE — Progress Notes (Signed)
Virtual Visit via Telephone Note  I connected with Rhonda Miles on 12/23/2020 at 5:00 PM by telephone and verified that I am speaking with the correct person using two identifiers  Location: Patient: Sitting in her car Provider: Office  Participants: Myself Patient   I discussed the limitations, risks, security and privacy concerns of performing an evaluation and management service by telephone and the availability of in person appointments. I also discussed with the patient that there may be a patient responsible charge related to this service. The patient expressed understanding and agreed to proceed.   History of Present Illness: Patient with history of DM type II, HL,IDA,obesity, HTN.  This is an urgent care visit for acute back pain.  Patient complains of acute lower back pain that started 4 days ago.  Patient states she was bending over to pick up her shoe off the floor. When she stood up she felt a strong pain in her lower back more on the right side.  The pain radiates down the back of the leg to about the level of the knee and is shooting in character.  Denies any numbness in the leg.  She has not had any incontinence of bowel or bladder.  No paresthesia in the groin area.  She started taking Aleve 2 tablets twice a day and using a heating pad.  The pain initially was 10 out of 10 but now 6/10.  She thought she had some Flexeril left from previous episode of back pain but she did not.  Outpatient Encounter Medications as of 12/23/2020  Medication Sig Note  . AMBULATORY NON FORMULARY MEDICATION Diltiazem 2% gel mixed with the Lidocaine 5% Apply pea size amount inside rectum three times a day (Patient not taking: Reported on 12/06/2020)   . amLODipine (NORVASC) 10 MG tablet Take 1 tablet (10 mg total) by mouth daily.   Marland Kitchen atorvastatin (LIPITOR) 40 MG tablet Take 1 tablet by mouth once daily   . bisacodyl (DULCOLAX) 5 MG EC tablet Take 5 mg by mouth 2 (two) times daily as needed  for moderate constipation. (Patient not taking: Reported on 12/06/2020)   . Blood Glucose Monitoring Suppl (ONETOUCH VERIO) w/Device KIT Use as directed to check blood sugar twice daily. E08.9   . Blood Pressure Monitor DEVI Use as directed to check home blood pressure 2-3 times a week   . cholecalciferol (VITAMIN D) 1000 UNITS tablet Take 1,000 Units by mouth daily.   . cyclobenzaprine (FLEXERIL) 5 MG tablet Take 1 tablet (5 mg total) by mouth daily as needed for muscle spasms. (Patient not taking: Reported on 12/06/2020) 03/03/2020: prn  . Ferrous Sulfate (IRON) 325 (65 Fe) MG TABS Take 1 tablet by mouth twice a week   . glimepiride (AMARYL) 2 MG tablet TAKE 1 TABLET BY MOUTH ONCE DAILY BEFORE BREAKFAST DOSE  INCREASE   . glucose blood (ONETOUCH VERIO) test strip Use as directed to check blood sugar twice daily. E08.9   . lisinopril (ZESTRIL) 20 MG tablet Take 1 tablet (20 mg total) by mouth daily.   . metFORMIN (GLUCOPHAGE) 500 MG tablet TAKE 2 TABLETS BY MOUTH AFTER BREAKFAST AND TAKE 1 TABLET BY MOUTH AFTER LUNCH AND 2 TABLETS AFTER DINNER   . OneTouch Delica Lancets 76L MISC Use as directed to check blood sugar twice daily. E08.9    No facility-administered encounter medications on file as of 12/23/2020.      Observations/Objective: No direct observation done as this was a telephone encounter.  Assessment  and Plan: 1. Sciatica of right side -Recommend that she avoids any heavy lifting, pushing pulling.  Continue using the heating pad as needed.  We will give a limited supply of tramadol to use as needed for pain.  Advised that it is a narcotic and can cause some drowsiness.  Refill given on Flexeril.  [place] controlled substance reporting system reviewed. Follow-up if no improvement with conservative management. - cyclobenzaprine (FLEXERIL) 5 MG tablet; Take 1 tablet (5 mg total) by mouth daily as needed for muscle spasms.  Dispense: 30 tablet; Refill: 0 - traMADol (ULTRAM) 50 MG  tablet; Take 1 tablet (50 mg total) by mouth every 8 (eight) hours as needed for up to 5 days.  Dispense: 15 tablet; Refill: 0    Follow Up Instructions: As needed   I discussed the assessment and treatment plan with the patient. The patient was provided an opportunity to ask questions and all were answered. The patient agreed with the plan and demonstrated an understanding of the instructions.   The patient was advised to call back or seek an in-person evaluation if the symptoms worsen or if the condition fails to improve as anticipated.  I  Spent 8 minutes on this telephone encounter  Karle Plumber, MD

## 2020-12-27 ENCOUNTER — Other Ambulatory Visit: Payer: Self-pay | Admitting: Internal Medicine

## 2020-12-27 DIAGNOSIS — Z1231 Encounter for screening mammogram for malignant neoplasm of breast: Secondary | ICD-10-CM

## 2021-01-05 ENCOUNTER — Encounter: Payer: Self-pay | Admitting: Internal Medicine

## 2021-01-20 ENCOUNTER — Other Ambulatory Visit: Payer: Self-pay | Admitting: Internal Medicine

## 2021-01-20 DIAGNOSIS — E1169 Type 2 diabetes mellitus with other specified complication: Secondary | ICD-10-CM

## 2021-01-20 DIAGNOSIS — E785 Hyperlipidemia, unspecified: Secondary | ICD-10-CM

## 2021-02-20 ENCOUNTER — Other Ambulatory Visit: Payer: Self-pay

## 2021-02-20 ENCOUNTER — Ambulatory Visit
Admission: RE | Admit: 2021-02-20 | Discharge: 2021-02-20 | Disposition: A | Discharge status: Home / Self Care | Payer: Medicare Other | Source: Ambulatory Visit | Attending: Internal Medicine | Admitting: Internal Medicine

## 2021-02-20 DIAGNOSIS — Z1231 Encounter for screening mammogram for malignant neoplasm of breast: Secondary | ICD-10-CM

## 2021-03-27 ENCOUNTER — Encounter: Payer: Self-pay | Admitting: Internal Medicine

## 2021-03-27 DIAGNOSIS — I10 Essential (primary) hypertension: Secondary | ICD-10-CM

## 2021-03-28 MED ORDER — AMLODIPINE BESYLATE 10 MG PO TABS: 10.0000 mg | ORAL_TABLET | Freq: Every day | ORAL | 2 refills | Status: DC

## 2021-04-14 ENCOUNTER — Other Ambulatory Visit: Payer: Self-pay

## 2021-04-14 ENCOUNTER — Encounter: Payer: Self-pay | Admitting: Internal Medicine

## 2021-04-14 ENCOUNTER — Ambulatory Visit: Payer: Medicare (Managed Care) | Attending: Internal Medicine | Admitting: Internal Medicine

## 2021-04-14 VITALS — BP 119/70 | HR 79 | Resp 16 | Wt 227.0 lb

## 2021-04-14 DIAGNOSIS — I10 Essential (primary) hypertension: Secondary | ICD-10-CM | POA: Diagnosis not present

## 2021-04-14 DIAGNOSIS — E1169 Type 2 diabetes mellitus with other specified complication: Secondary | ICD-10-CM | POA: Diagnosis not present

## 2021-04-14 DIAGNOSIS — Z23 Encounter for immunization: Secondary | ICD-10-CM | POA: Diagnosis not present

## 2021-04-14 DIAGNOSIS — E669 Obesity, unspecified: Secondary | ICD-10-CM | POA: Diagnosis not present

## 2021-04-14 DIAGNOSIS — R609 Edema, unspecified: Secondary | ICD-10-CM | POA: Diagnosis not present

## 2021-04-14 DIAGNOSIS — E785 Hyperlipidemia, unspecified: Secondary | ICD-10-CM

## 2021-04-14 LAB — GLUCOSE, POCT (MANUAL RESULT ENTRY): POC Glucose: 78 mg/dl (ref 70–99)

## 2021-04-14 MED ORDER — LISINOPRIL 20 MG PO TABS: 20.0000 mg | ORAL_TABLET | Freq: Every day | ORAL | 1 refills | Status: DC

## 2021-04-14 MED ORDER — GLIMEPIRIDE 1 MG PO TABS: ORAL_TABLET | ORAL | 1 refills | Status: DC

## 2021-04-14 NOTE — Progress Notes (Signed)
Patient ID: Rhonda Miles, female    DOB: [birth year]  MRN: [id number]  CC: Diabetes   Subjective: Rhonda Miles is a 70 y.o. female who presents for chronic ds Her concerns today include: Patient with history of DM type II, HL, IDA, obesity, HTN.   DM:   Results for orders placed or performed in visit on 04/14/21  POCT glucose (manual entry)  Result Value Ref Range   POC Glucose 78 70 - 99 mg/dl  BS was running a bit high during the summer. She determined that eating watermelon was causing it.  She was eating it 3 times a day.  BS went down when she stopped Checking BID.  Before BF range 110. Before dinner 70-115 Taking Metformin and Amaryl 2 mg. 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  HTN: Doing well on amlodipine and lisinopril.  She checks blood pressure regularly.  She states her readings have been good.  The highest systolic reading was 945.  She limits salt in the foods.  No chest pains or shortness of breath.  She does get swelling in the legs when she does a lot of sitting which she has been doing lately.  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   HL: Taking and tolerating Lipitor HM: She is due for flu shot and is agreeable to receiving it today. Patient Active Problem List   Diagnosis Date Noted   Iron deficiency anemia 08/28/2019   Influenza vaccine needed 04/03/2019   Hyperlipidemia associated with type 2 diabetes mellitus ([place]) 04/03/2019   Essential hypertension 06/07/2017   Chronic posterior anal fissure 03/26/2017   Prolapsed internal hemorrhoids, grade 2 03/26/2017   Glaucoma (increased eye pressure) 07/12/2015   Snoring 07/12/2015   Bothersome menopausal vasomotor symptoms 06/03/2014   Diabetes mellitus due to underlying condition without complications ([place]) 85/92/9244   Obesity 02/13/2013     Current Outpatient Medications on File Prior to Visit  Medication  Sig Dispense Refill   amLODipine (NORVASC) 10 MG tablet Take 1 tablet (10 mg total) by mouth daily. 90 tablet 2   atorvastatin (LIPITOR) 40 MG tablet Take 1 tablet by mouth once daily 90 tablet 2   Blood Glucose Monitoring Suppl (ONETOUCH VERIO) w/Device KIT Use as directed to check blood sugar twice daily. E08.9 1 kit 0   Blood Pressure Monitor DEVI Use as directed to check home blood pressure 2-3 times a week 1 Device 0   Ferrous Sulfate (IRON) 325 (65 Fe) MG TABS Take 1 tablet by mouth twice a week 90 tablet 0   glucose blood (ONETOUCH VERIO) test strip Use as directed to check blood sugar twice daily. E08.9 100 each 6   metFORMIN (GLUCOPHAGE) 500 MG tablet TAKE 2 TABLETS BY MOUTH AFTER BREAKFAST AND TAKE 1 TABLET BY MOUTH AFTER LUNCH AND 2 TABLETS AFTER DINNER 450 tablet 2   OneTouch Delica Lancets 62M MISC Use as directed to check blood sugar twice daily. E08.9 100 each 6   AMBULATORY NON FORMULARY MEDICATION Diltiazem 2% gel mixed with the Lidocaine 5% Apply pea size amount inside rectum three times a day (Patient not taking: No sig reported) 30 g 3   cholecalciferol (VITAMIN D) 1000 UNITS tablet Take 1,000 Units by mouth daily. (Patient not taking: Reported on 04/14/2021)     cyclobenzaprine (FLEXERIL) 5 MG tablet Take 1 tablet (5 mg total) by mouth  daily as needed for muscle spasms. (Patient not taking: Reported on 04/14/2021) 30 tablet 0   No current facility-administered medications on file prior to visit.    Allergies  Allergen Reactions   Asa [Aspirin] Other (See Comments)    shakey    Social History   Socioeconomic History   Marital status: Single    Spouse name: Not on file   Number of children: Not on file   Years of education: Not on file   Highest education level: Not on file  Occupational History   Occupation: Caregiver    Employer: [employer]  Tobacco Use   Smoking status: Former    Types: Cigarettes    Quit date: 10/22/1983    Years since  quitting: 37.5   Smokeless tobacco: Never  Vaping Use   Vaping Use: Never used  Substance and Sexual Activity   Alcohol use: No   Drug use: No   Sexual activity: Yes    Partners: Male  Other Topics Concern   Not on file  Social History Narrative   Personal care assistant   Married   Social Determinants of Health   Financial Resource Strain: Not on file  Food Insecurity: Not on file  Transportation Needs: Not on file  Physical Activity: Not on file  Stress: Not on file  Social Connections: Not on file  Intimate Partner Violence: Not on file    Family History  Problem Relation Age of Onset   Diabetes Father    Cancer - Other Father        FROM CHEMICAL EXPOSURE   Hypertension Mother    Colon cancer Maternal Grandmother 60   Breast cancer Maternal Aunt    Stomach cancer Neg Hx    Pancreatic cancer Neg Hx     Past Surgical History:  Procedure Laterality Date   BILATERAL SALPINGOOPHORECTOMY  11/05/2002   COLONOSCOPY  09/2013   LACERATION REPAIR Right 1957   ankle from lawnmower accident   [place]  11/05/2002   Dr. Matthew Saras, done for menorrhagia.  Benign pathology    ROS: Review of Systems Negative except as stated above  PHYSICAL EXAM: BP 119/70   Pulse 79   Resp 16   Wt 227 lb (103 kg)   SpO2 97%   BMI 35.55 kg/m   Physical Exam  General appearance - alert, well appearing, and in no distress Mental status - normal mood, behavior, speech, dress, motor activity, and thought processes Neck - supple, no significant adenopathy Chest - clear to auscultation, no wheezes, rales or rhonchi, symmetric air entry Heart - normal rate, regular rhythm, normal S1, S2, no murmurs, rubs, clicks or gallops Extremities -trace bilateral lower extremity edema. Diabetic Foot Exam - Simple   Simple Foot Form Visual Inspection No deformities, no ulcerations, no other skin breakdown bilaterally: Yes Sensation Testing Intact to touch and  monofilament testing bilaterally: Yes Pulse Check Posterior Tibialis and Dorsalis pulse intact bilaterally: Yes Comments       CMP Latest Ref Rng & Units 01/28/2020 09/04/2019 06/26/2018  Glucose 65 - 99 mg/dL 76 185(H) 95  BUN 8 - 27 mg/dL '13 10 10  ' Creatinine 0.57 - 1.00 mg/dL 0.57 0.68 0.73  Sodium 134 - 144 mmol/L 140 140 141  Potassium 3.5 - 5.2 mmol/L 4.2 4.1 4.3  Chloride 96 - 106 mmol/L 101 101 100  CO2 20 - 29 mmol/L '25 27 23  ' Calcium 8.7 - 10.3 mg/dL 9.4 9.6 9.7  Total Protein 6.0 - 8.5 g/dL - 7.2 7.1  Total Bilirubin 0.0 - 1.2 mg/dL - 0.5 0.4  Alkaline Phos 39 - 117 IU/L - 84 91  AST 0 - 40 IU/L - 16 22  ALT 0 - 32 IU/L - 29 30   Lipid Panel     Component Value Date/Time   CHOL 116 11/30/2020 1625   TRIG 59 11/30/2020 1625   HDL 51 11/30/2020 1625   CHOLHDL 2.3 11/30/2020 1625   CHOLHDL 2.9 10/31/2015 1245   VLDL 15 10/31/2015 1245   LDLCALC 52 11/30/2020 1625    CBC    Component Value Date/Time   WBC 6.5 01/28/2020 1611   WBC 5.3 10/31/2015 1245   RBC 4.53 01/28/2020 1611   RBC 4.85 10/31/2015 1245   HGB 12.1 01/28/2020 1611   HCT 38.3 01/28/2020 1611   PLT 322 01/28/2020 1611   MCV 85 01/28/2020 1611   MCH 26.7 01/28/2020 1611   MCH 24.1 (L) 10/31/2015 1245   MCHC 31.6 01/28/2020 1611   MCHC 31.3 (L) 10/31/2015 1245   RDW 14.3 01/28/2020 1611   LYMPHSABS 2.4 10/26/2013 1154   MONOABS 0.3 10/26/2013 1154   EOSABS 0.1 10/26/2013 1154   BASOSABS 0.0 10/26/2013 1154    ASSESSMENT AND PLAN: 1. Type 2 diabetes mellitus with obesity ([place]) Reported blood sugars are good but she is getting some blood sugars in the 70s which are low.  Blood sugar today in the office is 78.  She was given a cup of grape juice.  We will have her decrease the Amaryl to 1 mg daily.  Encouraged her to get in some exercise several days a week. - POCT glucose (manual entry) - Hemoglobin A1c; Future - glimepiride (AMARYL) 1 MG tablet; TAKE 1 TABLET BY MOUTH ONCE DAILY BEFORE  BREAKFAST DOSE  INCREASE  Dispense: 90 tablet; Refill: 1  2. Essential hypertension At goal.  Continue amlodipine and lisinopril at current dose. - lisinopril (ZESTRIL) 20 MG tablet; Take 1 tablet (20 mg total) by mouth daily.  Dispense: 90 tablet; Refill: 1  3. Hyperlipidemia associated with type 2 diabetes mellitus (HCC) Continue atorvastatin.  4. Dependent edema Recommend purchasing some compression socks and wearing them during the day since she states 8 hours a day.  5. Need for immunization against influenza - Flu Vaccine QUAD 61moIM (Fluarix, Fluzone & Alfiuria Quad PF)     Patient was given the opportunity to ask questions.  Patient verbalized understanding of the plan and was able to repeat key elements of the plan.   Orders Placed This Encounter  Procedures   Flu Vaccine QUAD 679moM (Fluarix, Fluzone & Alfiuria Quad PF)   Hemoglobin A1c   POCT glucose (manual entry)     Requested Prescriptions   Signed Prescriptions Disp Refills   lisinopril (ZESTRIL) 20 MG tablet 90 tablet 1    Sig: Take 1 tablet (20 mg total) by mouth daily.   glimepiride (AMARYL) 1 MG tablet 90 tablet 1    Sig: TAKE 1 TABLET BY MOUTH ONCE DAILY BEFORE BREAKFAST DOSE  INCREASE    Return in about 4 months (around 08/14/2021).  DeKarle PlumberMD, FACP

## 2021-04-14 NOTE — Patient Instructions (Addendum)
Decrease Amaryl to 1 mg daily.  Purchase and wear compression socks when you plan to do a lot of sitting.

## 2021-04-17 ENCOUNTER — Other Ambulatory Visit: Payer: Self-pay

## 2021-04-17 ENCOUNTER — Ambulatory Visit: Payer: Medicare (Managed Care) | Attending: Internal Medicine

## 2021-04-17 DIAGNOSIS — E1169 Type 2 diabetes mellitus with other specified complication: Secondary | ICD-10-CM

## 2021-04-18 LAB — HEMOGLOBIN A1C
Est. average glucose Bld gHb Est-mCnc: 169 mg/dL
Hgb A1c MFr Bld: 7.5 % — ABNORMAL HIGH (ref 4.8–5.6)

## 2021-05-29 ENCOUNTER — Encounter: Payer: Self-pay | Admitting: Internal Medicine

## 2021-05-29 ENCOUNTER — Other Ambulatory Visit: Payer: Self-pay

## 2021-05-29 DIAGNOSIS — E1169 Type 2 diabetes mellitus with other specified complication: Secondary | ICD-10-CM

## 2021-05-29 DIAGNOSIS — E669 Obesity, unspecified: Secondary | ICD-10-CM

## 2021-05-29 MED ORDER — METFORMIN HCL 500 MG PO TABS: ORAL_TABLET | ORAL | 2 refills | Status: DC

## 2021-06-28 ENCOUNTER — Encounter: Payer: Self-pay | Admitting: Internal Medicine

## 2021-06-28 NOTE — Progress Notes (Signed)
Note from [facility].  Patient was seen in her home on 05/23/2021 by her nurse practitioner associated with her insurance [employer].  PAD screening revealed borderline results with left being 0.92 and the right being 0.9.

## 2021-08-14 ENCOUNTER — Other Ambulatory Visit: Payer: Self-pay

## 2021-08-14 ENCOUNTER — Ambulatory Visit: Payer: Medicare (Managed Care) | Attending: Internal Medicine | Admitting: Internal Medicine

## 2021-08-14 ENCOUNTER — Encounter: Payer: Self-pay | Admitting: Internal Medicine

## 2021-08-14 VITALS — BP 135/70 | HR 86 | Resp 16 | Wt 231.4 lb

## 2021-08-14 DIAGNOSIS — E669 Obesity, unspecified: Secondary | ICD-10-CM | POA: Diagnosis not present

## 2021-08-14 DIAGNOSIS — I10 Essential (primary) hypertension: Secondary | ICD-10-CM | POA: Diagnosis not present

## 2021-08-14 DIAGNOSIS — E1169 Type 2 diabetes mellitus with other specified complication: Secondary | ICD-10-CM | POA: Diagnosis not present

## 2021-08-14 DIAGNOSIS — E785 Hyperlipidemia, unspecified: Secondary | ICD-10-CM

## 2021-08-14 DIAGNOSIS — R300 Dysuria: Secondary | ICD-10-CM | POA: Diagnosis not present

## 2021-08-14 LAB — POCT GLYCOSYLATED HEMOGLOBIN (HGB A1C): HbA1c, POC (controlled diabetic range): 7.8 % — AB (ref 0.0–7.0)

## 2021-08-14 LAB — GLUCOSE, POCT (MANUAL RESULT ENTRY): POC Glucose: 100 mg/dl — AB (ref 70–99)

## 2021-08-14 MED ORDER — SULFAMETHOXAZOLE-TRIMETHOPRIM 800-160 MG PO TABS: 1.0000 | ORAL_TABLET | Freq: Two times a day (BID) | ORAL | 0 refills | Status: DC

## 2021-08-14 MED ORDER — GLIMEPIRIDE 2 MG PO TABS: ORAL_TABLET | ORAL | 3 refills | Status: DC

## 2021-08-14 NOTE — Progress Notes (Signed)
Patient ID: Rhonda Miles, female    DOB: 09-28-1950  MRN: [id number]  CC: No chief complaint on file.   Subjective: Rhonda Miles is a 71 y.o. female who presents for Her concerns today include:  Patient with history of DM type II, HL, IDA, obesity, HTN.    DIABETES TYPE 2 Last A1C:   Results for orders placed or performed in visit on 08/14/21  POCT glucose (manual entry)  Result Value Ref Range   POC Glucose 100 (A) 70 - 99 mg/dl  POCT glycosylated hemoglobin (Hb A1C)  Result Value Ref Range   Hemoglobin A1C     HbA1c POC (<> result, manual entry)     HbA1c, POC (prediabetic range)     HbA1c, POC (controlled diabetic range) 7.8 (A) 0.0 - 7.0 %    Med Adherence:  _0  Yes -she is on metformin and glimepiride 1 mg. Medication side effects:  _1  Yes    _2  No Home Monitoring?  _3  Yes every morning before BF    _4  No Home glucose results range: 112-159 Diet Adherence: portion sizes unchanged Exercise: not exercising like she use to. 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  She use to walk with her.  Hypoglycemic episodes?: _5  Yes    _6  No Numbness of the feet? _7  Yes    _8  No Retinopathy hx? _9  Yes    _10  No Last eye exam:  has appt with Dr. [facility] 08/16/2021 Comments:   HYPERTENSION Currently taking: see medication list.  She is on amlodipine and lisinopril Med Adherence: _11  Yes    _12  No Medication side effects: _13  Yes    _14  No Adherence with salt restriction: _15  Yes    _16  No Home Monitoring?: _17  Yes    _18  No Monitoring Frequency:  Home BP results range: 120-130/70s SOB? _19  Yes    _20  No Chest Pain?: _21  Yes    _22  No Leg swelling?: _23  Yes    _24  No Headaches?: _25  Yes    _26  No Dizziness? _27  Yes    _28  No Comments:   HL: Reports compliance with and tolerating atorvastatin  C/o feeling of pressure post urination x 1 wk.  Urine with odor.  Going more frequent at nights.  No hematuria Patient Active Problem List   Diagnosis Date Noted   Iron  deficiency anemia 08/28/2019   Influenza vaccine needed 04/03/2019   Hyperlipidemia associated with type 2 diabetes mellitus ([place]) 04/03/2019   Essential hypertension 06/07/2017   Chronic posterior anal fissure 03/26/2017   Prolapsed internal hemorrhoids, grade 2 03/26/2017   Glaucoma (increased eye pressure) 07/12/2015   Snoring 07/12/2015   Bothersome menopausal vasomotor symptoms 06/03/2014   Diabetes mellitus due to underlying condition without complications ([place]) 07/25/7251   Obesity 02/13/2013     Current Outpatient Medications on File Prior to Visit  Medication Sig Dispense Refill   AMBULATORY NON FORMULARY MEDICATION Diltiazem 2% gel mixed with the Lidocaine 5% Apply pea size amount inside rectum three times a day (Patient not taking: No sig reported) 30 g 3   amLODipine (NORVASC) 10 MG tablet Take 1 tablet (10 mg total) by mouth daily. 90 tablet 2   atorvastatin (LIPITOR) 40 MG tablet Take 1 tablet by mouth once daily 90 tablet 2   Blood Glucose Monitoring Suppl (ONETOUCH VERIO) w/Device KIT Use as directed to check blood sugar twice daily. E08.9 1 kit 0   Blood Pressure Monitor DEVI Use as directed to check  home blood pressure 2-3 times a week 1 Device 0   cholecalciferol (VITAMIN D) 1000 UNITS tablet Take 1,000 Units by mouth daily. (Patient not taking: Reported on 04/14/2021)     cyclobenzaprine (FLEXERIL) 5 MG tablet Take 1 tablet (5 mg total) by mouth daily as needed for muscle spasms. (Patient not taking: Reported on 04/14/2021) 30 tablet 0   Ferrous Sulfate (IRON) 325 (65 Fe) MG TABS Take 1 tablet by mouth twice a week 90 tablet 0   glucose blood (ONETOUCH VERIO) test strip Use as directed to check blood sugar twice daily. E08.9 100 each 6   lisinopril (ZESTRIL) 20 MG tablet Take 1 tablet (20 mg total) by mouth daily. 90 tablet 1   metFORMIN (GLUCOPHAGE) 500 MG tablet TAKE 2 TABLETS BY MOUTH AFTER BREAKFAST AND TAKE 1 TABLET BY MOUTH AFTER LUNCH AND 2 TABLETS AFTER DINNER 450  tablet 2   OneTouch Delica Lancets 78E MISC Use as directed to check blood sugar twice daily. E08.9 100 each 6   No current facility-administered medications on file prior to visit.    Allergies  Allergen Reactions   Asa [Aspirin] Other (See Comments)    shakey    Social History   Socioeconomic History   Marital status: Single    Spouse name: Not on file   Number of children: Not on file   Years of education: Not on file   Highest education level: Not on file  Occupational History   Occupation: Caregiver    Employer: [employer]  Tobacco Use   Smoking status: Former    Types: Cigarettes    Quit date: 10/22/1983    Years since quitting: 37.8   Smokeless tobacco: Never  Vaping Use   Vaping Use: Never used  Substance and Sexual Activity   Alcohol use: No   Drug use: No   Sexual activity: Yes    Partners: Male  Other Topics Concern   Not on file  Social History Narrative   Personal care assistant   Married   Social Determinants of Health   Financial Resource Strain: Not on file  Food Insecurity: Not on file  Transportation Needs: Not on file  Physical Activity: Not on file  Stress: Not on file  Social Connections: Not on file  Intimate Partner Violence: Not on file    Family History  Problem Relation Age of Onset   Diabetes Father    Cancer - Other Father        FROM CHEMICAL EXPOSURE   Hypertension Mother    Colon cancer Maternal Grandmother 60   Breast cancer Maternal Aunt    Stomach cancer Neg Hx    Pancreatic cancer Neg Hx     Past Surgical History:  Procedure Laterality Date   BILATERAL SALPINGOOPHORECTOMY  11/05/2002   COLONOSCOPY  09/2013   LACERATION REPAIR Right 1957   ankle from lawnmower accident   [place]  11/05/2002   Dr. Matthew Saras, done for menorrhagia.  Benign pathology    ROS: Review of Systems Negative except as stated above  PHYSICAL EXAM: BP 135/70    Pulse 86    Resp 16     Wt 231 lb 6.4 oz (105 kg)    SpO2 97%    BMI 36.24 kg/m   Wt Readings from Last 3 Encounters:  08/14/21 231 lb 6.4 oz (105 kg)  04/14/21 227 lb (103 kg)  12/06/20 224 lb 12.8 oz (102 kg)    Physical  Exam  General appearance - alert, well appearing, and in no distress Mental status - normal mood, behavior, speech, dress, motor activity, and thought processes Neck - supple, no significant adenopathy Chest - clear to auscultation, no wheezes, rales or rhonchi, symmetric air entry Heart - normal rate, regular rhythm, normal S1, S2, no murmurs, rubs, clicks or gallops Extremities - peripheral pulses normal, no pedal edema, no clubbing or cyanosis   CMP Latest Ref Rng & Units 01/28/2020 09/04/2019 06/26/2018  Glucose 65 - 99 mg/dL 76 185(H) 95  BUN 8 - 27 mg/dL _0 Creatinine 0.57 - 1.00 mg/dL 0.57 0.68 0.73  Sodium 134 - 144 mmol/L 140 140 141  Potassium 3.5 - 5.2 mmol/L 4.2 4.1 4.3  Chloride 96 - 106 mmol/L 101 101 100  CO2 20 - 29 mmol/L _1 Calcium 8.7 - 10.3 mg/dL 9.4 9.6 9.7  Total Protein 6.0 - 8.5 g/dL - 7.2 7.1  Total Bilirubin 0.0 - 1.2 mg/dL - 0.5 0.4  Alkaline Phos 39 - 117 IU/L - 84 91  AST 0 - 40 IU/L - 16 22  ALT 0 - 32 IU/L - 29 30   Lipid Panel     Component Value Date/Time   CHOL 116 11/30/2020 1625   TRIG 59 11/30/2020 1625   HDL 51 11/30/2020 1625   CHOLHDL 2.3 11/30/2020 1625   CHOLHDL 2.9 10/31/2015 1245   VLDL 15 10/31/2015 1245   LDLCALC 52 11/30/2020 1625    CBC    Component Value Date/Time   WBC 6.5 01/28/2020 1611   WBC 5.3 10/31/2015 1245   RBC 4.53 01/28/2020 1611   RBC 4.85 10/31/2015 1245   HGB 12.1 01/28/2020 1611   HCT 38.3 01/28/2020 1611   PLT 322 01/28/2020 1611   MCV 85 01/28/2020 1611   MCH 26.7 01/28/2020 1611   MCH 24.1 (L) 10/31/2015 1245   MCHC 31.6 01/28/2020 1611   MCHC 31.3 (L) 10/31/2015 1245   RDW 14.3 01/28/2020 1611   LYMPHSABS 2.4 10/26/2013 1154   MONOABS 0.3 10/26/2013 1154   EOSABS 0.1 10/26/2013  1154   BASOSABS 0.0 10/26/2013 1154    ASSESSMENT AND PLAN: 1. Type 2 diabetes mellitus with obesity (HCC) Not at goal and A1c has been steadily increasing. Patient plans to start walking a few days a week.  Encouraged her to try to get a friend to walk with her so that they can keep each other motivated.  Encourage healthy eating habits.  Increase Amaryl to 2 mg daily.  Continue current dose of metformin. - POCT glucose (manual entry) - POCT glycosylated hemoglobin (Hb A1C) - CBC; Future - Comprehensive metabolic panel; Future - glimepiride (AMARYL) 2 MG tablet; TAKE 1 TABLET BY MOUTH ONCE DAILY BEFORE BREAKFAST DOSE  INCREASE  Dispense: 90 tablet; Refill: 3  2. Essential hypertension Close to goal.  Home blood pressure readings have been good.  Continue current dose of Norvasc and lisinopril.  3. Hyperlipidemia associated with type 2 diabetes mellitus (HCC) Continue atorvastatin.  4. Dysuria Lab had already shut down for today so I was unable to do a point-of-care urinalysis.  We will treat for possible UTI based on symptoms with Bactrim. - sulfamethoxazole-trimethoprim (BACTRIM DS) 800-160 MG tablet; Take 1 tablet by mouth 2 (two) times daily.  Dispense: 14 tablet; Refill: 0     Patient was given the opportunity to ask questions.  Patient verbalized understanding of the plan and was able to repeat key elements of  the plan.   Orders Placed This Encounter  Procedures   CBC   Comprehensive metabolic panel   POCT glucose (manual entry)   POCT glycosylated hemoglobin (Hb A1C)     Requested Prescriptions   Signed Prescriptions Disp Refills   glimepiride (AMARYL) 2 MG tablet 90 tablet 3    Sig: TAKE 1 TABLET BY MOUTH ONCE DAILY BEFORE BREAKFAST DOSE  INCREASE   sulfamethoxazole-trimethoprim (BACTRIM DS) 800-160 MG tablet 14 tablet 0    Sig: Take 1 tablet by mouth 2 (two) times daily.    Return in about 4 months (around 12/12/2021).  Karle Plumber, MD, FACP

## 2021-08-16 LAB — HM DIABETES EYE EXAM

## 2021-08-18 ENCOUNTER — Ambulatory Visit: Payer: Medicare (Managed Care) | Attending: Internal Medicine

## 2021-08-18 ENCOUNTER — Telehealth: Payer: Self-pay | Admitting: Internal Medicine

## 2021-08-18 ENCOUNTER — Other Ambulatory Visit: Payer: Self-pay

## 2021-08-18 DIAGNOSIS — E1169 Type 2 diabetes mellitus with other specified complication: Secondary | ICD-10-CM

## 2021-08-18 NOTE — Telephone Encounter (Signed)
Patient states she ws told to come in and do blodd work, but system already shows some blood work , but not released Please call back to resolve.

## 2021-08-18 NOTE — Telephone Encounter (Signed)
Patient will come today for labwork.

## 2021-08-19 LAB — CBC
Hematocrit: 40.6 % (ref 34.0–46.6)
Hemoglobin: 13.1 g/dL (ref 11.1–15.9)
MCH: 26.4 pg — ABNORMAL LOW (ref 26.6–33.0)
MCHC: 32.3 g/dL (ref 31.5–35.7)
MCV: 82 fL (ref 79–97)
Platelets: 378 10*3/uL (ref 150–450)
RBC: 4.97 x10E6/uL (ref 3.77–5.28)
RDW: 14.2 % (ref 11.7–15.4)
WBC: 5.5 10*3/uL (ref 3.4–10.8)

## 2021-08-19 LAB — COMPREHENSIVE METABOLIC PANEL
ALT: 21 IU/L (ref 0–32)
AST: 19 IU/L (ref 0–40)
Albumin/Globulin Ratio: 1.5 (ref 1.2–2.2)
Albumin: 4.4 g/dL (ref 3.8–4.8)
Alkaline Phosphatase: 87 IU/L (ref 44–121)
BUN/Creatinine Ratio: 13 (ref 12–28)
BUN: 10 mg/dL (ref 8–27)
Bilirubin Total: 0.7 mg/dL (ref 0.0–1.2)
CO2: 23 mmol/L (ref 20–29)
Calcium: 9.8 mg/dL (ref 8.7–10.3)
Chloride: 102 mmol/L (ref 96–106)
Creatinine, Ser: 0.8 mg/dL (ref 0.57–1.00)
Globulin, Total: 2.9 g/dL (ref 1.5–4.5)
Glucose: 129 mg/dL — ABNORMAL HIGH (ref 70–99)
Potassium: 4.6 mmol/L (ref 3.5–5.2)
Sodium: 139 mmol/L (ref 134–144)
Total Protein: 7.3 g/dL (ref 6.0–8.5)
eGFR: 80 mL/min/{1.73_m2} (ref >59)

## 2021-09-04 ENCOUNTER — Encounter: Payer: Self-pay | Admitting: Internal Medicine

## 2021-09-04 DIAGNOSIS — R300 Dysuria: Secondary | ICD-10-CM

## 2021-09-05 ENCOUNTER — Other Ambulatory Visit: Payer: Self-pay

## 2021-09-05 ENCOUNTER — Ambulatory Visit: Payer: Medicare (Managed Care) | Attending: Internal Medicine

## 2021-09-05 DIAGNOSIS — R300 Dysuria: Secondary | ICD-10-CM

## 2021-09-06 ENCOUNTER — Other Ambulatory Visit: Payer: Self-pay | Admitting: Internal Medicine

## 2021-09-06 ENCOUNTER — Telehealth: Payer: Self-pay | Admitting: Internal Medicine

## 2021-09-06 LAB — URINALYSIS, ROUTINE W REFLEX MICROSCOPIC
Bilirubin, UA: NEGATIVE
Glucose, UA: NEGATIVE
Nitrite, UA: NEGATIVE
RBC, UA: NEGATIVE
Specific Gravity, UA: 1.023 (ref 1.005–1.030)
Urobilinogen, Ur: 0.2 mg/dL (ref 0.2–1.0)
pH, UA: 5.5 (ref 5.0–7.5)

## 2021-09-06 LAB — MICROSCOPIC EXAMINATION
Casts: NONE SEEN /lpf
WBC, UA: >30 /hpf — AB (ref 0–5)

## 2021-09-06 MED ORDER — CIPROFLOXACIN HCL 500 MG PO TABS: 500.0000 mg | ORAL_TABLET | Freq: Two times a day (BID) | ORAL | 0 refills | Status: AC

## 2021-09-06 NOTE — Telephone Encounter (Signed)
Phone call placed to patient today.  I left a message on her voicemail informing her that the urinalysis shows that she does have a urinary tract infection.  I have sent a prescription to [facility] on [address] for the antibiotic called ciprofloxacin.  Advised that this antibiotic in combination with her diabetes pill glimepiride can sometimes cause the blood sugar to drop low.  Advised that she monitor blood sugar while on this antibiotic and if she is having lows, she should cut the Amaryl dose in half until she is finished with the antibiotics.  I have also sent a message to her MyChart account.

## 2021-09-08 LAB — URINE CULTURE

## 2021-10-23 ENCOUNTER — Encounter: Payer: Self-pay | Admitting: Internal Medicine

## 2021-10-23 ENCOUNTER — Other Ambulatory Visit: Payer: Self-pay

## 2021-10-23 DIAGNOSIS — I10 Essential (primary) hypertension: Secondary | ICD-10-CM

## 2021-10-23 DIAGNOSIS — E1169 Type 2 diabetes mellitus with other specified complication: Secondary | ICD-10-CM

## 2021-10-23 MED ORDER — LISINOPRIL 20 MG PO TABS: 20.0000 mg | ORAL_TABLET | Freq: Every day | ORAL | 1 refills | Status: DC

## 2021-10-23 MED ORDER — ATORVASTATIN CALCIUM 40 MG PO TABS: 40.0000 mg | ORAL_TABLET | Freq: Every day | ORAL | 2 refills | Status: DC

## 2021-11-10 ENCOUNTER — Ambulatory Visit: Payer: Medicare (Managed Care) | Attending: Internal Medicine

## 2021-11-10 DIAGNOSIS — Z0000 Encounter for general adult medical examination without abnormal findings: Secondary | ICD-10-CM | POA: Diagnosis not present

## 2021-11-10 NOTE — Progress Notes (Signed)
? ?Subjective:  ? Rhonda Miles is a 71 y.o. female who presents for Medicare Annual (Subsequent) preventive examination. ? ?Patient visit virtually in the context of Covid-19 pandemic. ? I connected with Rhonda Miles on 11/10/21 at 10:30am by telephone and verified that I am speaking with the correct person using two identifiers. ?I discussed the limitations, risks, security and privacy concerns of performing an evaluation and management service by telephone and the availability of in person appointments. I also discussed with the patient that there may be a patient responsible charge related to this service. The patient expressed understanding and agreed to proceed. ?Patient location:  Home ?My Location:  Office ?Persons on the telephone call:  Patient and myself ? ? ?Review of Systems    ? ?  ? ?   ?Objective:  ?  ?There were no vitals filed for this visit. ?There is no height or weight on file to calculate BMI. ? ? ?  11/10/2021  ? 10:34 AM 03/03/2020  ?  4:07 PM 01/03/2017  ?  1:41 PM 05/29/2016  ? 11:21 AM 02/20/2016  ? 12:01 PM 10/31/2015  ? 12:00 PM 09/09/2015  ?  8:16 PM  ?Advanced Directives  ?Does Patient Have a Medical Advance Directive? No No No No No No No  ?Would patient like information on creating a medical advance directive? No - Patient declined Yes (ED - Information included in AVS)  No - patient declined information No - patient declined information  No - patient declined information  ? ? ?Current Medications (verified) ?Outpatient Encounter Medications as of 11/10/2021  ?Medication Sig  ? amLODipine (NORVASC) 10 MG tablet Take 1 tablet (10 mg total) by mouth daily.  ? atorvastatin (LIPITOR) 40 MG tablet Take 1 tablet (40 mg total) by mouth daily.  ? Blood Glucose Monitoring Suppl (ONETOUCH VERIO) w/Device KIT Use as directed to check blood sugar twice daily. E08.9  ? Blood Pressure Monitor DEVI Use as directed to check home blood pressure 2-3 times a week  ? cholecalciferol (VITAMIN D) 1000  UNITS tablet Take 1,000 Units by mouth daily.  ? cyclobenzaprine (FLEXERIL) 5 MG tablet Take 1 tablet (5 mg total) by mouth daily as needed for muscle spasms.  ? Ferrous Sulfate (IRON) 325 (65 Fe) MG TABS Take 1 tablet by mouth twice a week  ? glimepiride (AMARYL) 2 MG tablet TAKE 1 TABLET BY MOUTH ONCE DAILY BEFORE BREAKFAST DOSE  INCREASE  ? glucose blood (ONETOUCH VERIO) test strip Use as directed to check blood sugar twice daily. E08.9  ? lisinopril (ZESTRIL) 20 MG tablet Take 1 tablet (20 mg total) by mouth daily.  ? metFORMIN (GLUCOPHAGE) 500 MG tablet TAKE 2 TABLETS BY MOUTH AFTER BREAKFAST AND TAKE 1 TABLET BY MOUTH AFTER LUNCH AND 2 TABLETS AFTER DINNER  ? OneTouch Delica Lancets 07P MISC Use as directed to check blood sugar twice daily. E08.9  ? AMBULATORY NON FORMULARY MEDICATION Diltiazem 2% gel mixed with the Lidocaine 5% ?Apply pea size amount inside rectum three times a day (Patient not taking: No sig reported)  ? sulfamethoxazole-trimethoprim (BACTRIM DS) 800-160 MG tablet Take 1 tablet by mouth 2 (two) times daily.  ? ?No facility-administered encounter medications on file as of 11/10/2021.  ? ? ?Allergies (verified) ?Asa [aspirin]  ? ?History: ?Past Medical History:  ?Diagnosis Date  ? Chronic low back pain without sciatica 10/31/2015  ? Chronic posterior anal fissure 03/26/2017  ? Diabetes mellitus without complication ([place]) Dx 7106  ? GERD (gastroesophageal reflux  disease) Dx 2014  ? Hemorrhoids   ? Hyperlipidemia   ? Prolapsed internal hemorrhoids, grade 2 03/26/2017  ? All 3 positions at anoscopy  ? ?Past Surgical History:  ?Procedure Laterality Date  ? BILATERAL SALPINGOOPHORECTOMY  11/05/2002  ? COLONOSCOPY  09/2013  ? LACERATION REPAIR Right 1957  ? ankle from lawnmower accident  ? LAPAROSCOPIC ASSISTED VAGINAL HYSTERECTOMY  11/05/2002  ? Dr. Matthew Saras, done for menorrhagia.  Benign pathology  ? ?Family History  ?Problem Relation Age of Onset  ? Diabetes Father   ? Cancer - Other Father   ?     FROM  CHEMICAL EXPOSURE  ? Hypertension Mother   ? Colon cancer Maternal Grandmother 15  ? Breast cancer Maternal Aunt   ? Stomach cancer Neg Hx   ? Pancreatic cancer Neg Hx   ? ?Social History  ? ?Socioeconomic History  ? Marital status: Single  ?  Spouse name: Not on file  ? Number of children: Not on file  ? Years of education: Not on file  ? Highest education level: Not on file  ?Occupational History  ? Occupation: Caregiver  ?  Employer: [employer]  ?Tobacco Use  ? Smoking status: Former  ?  Types: Cigarettes  ?  Quit date: 10/22/1983  ?  Years since quitting: 38.0  ? Smokeless tobacco: Never  ?Vaping Use  ? Vaping Use: Never used  ?Substance and Sexual Activity  ? Alcohol use: No  ? Drug use: No  ? Sexual activity: Yes  ?  Partners: Male  ?Other Topics Concern  ? 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 ?Social Determinants of Health  ? ?Financial Resource Strain: Not on file  ?Food Insecurity: Not on file  ?Transportation Needs: Not on file  ?Physical Activity: Not on file  ?Stress: Not on file  ?Social Connections: Not on file  ? ? ?Tobacco Counseling ?Counseling given: Not Answered ? ? ?Clinical Intake: ? ?Pre-visit preparation completed: Yes ? ?Pain : No/denies pain ? ?  ? ?Diabetes: Yes ?CBG done?: No ?Did pt. bring in CBG monitor from home?: No ? ?How often do you need to have someone help you when you read instructions, pamphlets, or other written materials from your doctor or pharmacy?: (P) 1 - Never ? ?Diabetic?yes ? ?Interpreter Needed?: No ? ?  ? ? ?Activities of Daily Living ? ?  11/10/2021  ? 10:35 AM 11/09/2021  ?  1:59 PM  ?In your present state of health, do you have any difficulty performing the following activities:  ?Hearing? 0 0  ?Vision? 0 0  ?Difficulty concentrating or making decisions? 0 0  ?Walking or climbing stairs? 0 0  ?Dressing or bathing? 0 0  ?Doing errands, shopping? 0 0  ?Preparing Food and eating ? N N  ?Using the Toilet?  N N  ?In the past six months, have you accidently leaked urine? N N  ?Do you have problems with loss of bowel control? N N  ?Managing your Medications? N N  ?Managing your Finances? N N  ?Housekeeping or managing your Housekeeping? N N  ? ? ?Patient Care Team: ?Ladell Pier, MD as PCP - General (Internal Medicine) ? ?Indicate any recent Medical Services you may have received from other than [facility] providers in the past year (date may be approximate). ? ?   ?Assessment:  ? This is a routine wellness examination for Emrie. ? ?Hearing/Vision screen ?No results  found. ? ?Dietary issues and exercise activities discussed: ?  ? ? Goals Addressed   ?None ?  ?Depression Screen ? ?  11/10/2021  ? 10:35 AM 04/14/2021  ?  4:21 PM 12/06/2020  ?  4:12 PM 03/03/2020  ?  4:08 PM 01/28/2020  ?  3:12 PM 08/28/2019  ?  4:50 PM 09/06/2017  ?  2:10 PM  ?PHQ 2/9 Scores  ?PHQ - 2 Score 0 0 0 0 0 0 0  ?  ?Fall Risk ? ?  11/10/2021  ? 10:34 AM 11/09/2021  ?  1:59 PM 04/14/2021  ?  4:21 PM 12/06/2020  ?  4:12 PM 08/05/2020  ?  4:43 PM  ?Fall Risk   ?Falls in the past year? 0 0 0 0 0  ?Number falls in past yr: 0 0 0 0 0  ?Injury with Fall? 0 0 0 0 0  ?Risk for fall due to :   No Fall Risks No Fall Risks   ?Follow up Falls evaluation completed      ? ? ?FALL RISK PREVENTION PERTAINING TO THE HOME: ? ?Any stairs in or around the home? Yes  ?If so, are there any without handrails? No  ?Home free of loose throw rugs in walkways, pet beds, electrical cords, etc? Yes  ?Adequate lighting in your home to reduce risk of falls? Yes  ? ?ASSISTIVE DEVICES UTILIZED TO PREVENT FALLS: ? ?Life alert? No  ?Use of a cane, walker or w/c? No  ?Grab bars in the bathroom? No  ?Shower chair or bench in shower? No  ?Elevated toilet seat or a handicapped toilet? No  ? ?TIMED UP AND GO: ? ?Was the test performed? No .  ?Length of time to ambulate 10 feet: 60 sec. Pt states it would take a minute or less to walk down the hallway ? ?Gait slow and steady without use of assistive  device ? ?Cognitive Function: ? ?  11/10/2021  ? 10:37 AM 03/03/2020  ?  4:11 PM  ?MMSE - Mini Mental State Exam  ?Orientation to time 5 5  ?Orientation to Place 5 5  ?Registration 3 3  ?Attention/ Calcula

## 2021-12-07 ENCOUNTER — Other Ambulatory Visit: Payer: Self-pay

## 2021-12-07 ENCOUNTER — Encounter: Payer: Self-pay | Admitting: Internal Medicine

## 2021-12-07 DIAGNOSIS — D509 Iron deficiency anemia, unspecified: Secondary | ICD-10-CM

## 2021-12-07 MED ORDER — IRON 325 (65 FE) MG PO TABS: ORAL_TABLET | ORAL | 0 refills | Status: DC

## 2021-12-19 ENCOUNTER — Encounter: Payer: Self-pay | Admitting: Internal Medicine

## 2021-12-19 ENCOUNTER — Telehealth: Payer: Self-pay | Admitting: Internal Medicine

## 2021-12-19 ENCOUNTER — Ambulatory Visit: Payer: Medicare (Managed Care) | Attending: Internal Medicine | Admitting: Internal Medicine

## 2021-12-19 VITALS — BP 121/68 | HR 68 | Ht 67.0 in | Wt 227.0 lb

## 2021-12-19 DIAGNOSIS — E785 Hyperlipidemia, unspecified: Secondary | ICD-10-CM | POA: Diagnosis not present

## 2021-12-19 DIAGNOSIS — I152 Hypertension secondary to endocrine disorders: Secondary | ICD-10-CM | POA: Diagnosis not present

## 2021-12-19 DIAGNOSIS — G6289 Other specified polyneuropathies: Secondary | ICD-10-CM

## 2021-12-19 DIAGNOSIS — Z6835 Body mass index (BMI) 35.0-35.9, adult: Secondary | ICD-10-CM

## 2021-12-19 DIAGNOSIS — E1159 Type 2 diabetes mellitus with other circulatory complications: Secondary | ICD-10-CM | POA: Diagnosis not present

## 2021-12-19 DIAGNOSIS — E1169 Type 2 diabetes mellitus with other specified complication: Secondary | ICD-10-CM

## 2021-12-19 DIAGNOSIS — E669 Obesity, unspecified: Secondary | ICD-10-CM

## 2021-12-19 LAB — GLUCOSE, POCT (MANUAL RESULT ENTRY): POC Glucose: 150 mg/dl — AB (ref 70–99)

## 2021-12-19 LAB — POCT GLYCOSYLATED HEMOGLOBIN (HGB A1C): HbA1c, POC (controlled diabetic range): 7.6 % — AB (ref 0.0–7.0)

## 2021-12-19 MED ORDER — GABAPENTIN 300 MG PO CAPS: 300.0000 mg | ORAL_CAPSULE | Freq: Every day | ORAL | 3 refills | Status: DC

## 2021-12-19 NOTE — Telephone Encounter (Signed)
May calling from [facility] is calling to report that the patient is out of network with Dr. Laural Benes. Pt was told that out of network cost $40. However, Rolene Arbour is confirming that the out of pocket cost for out of network is $5.  Cb- [phone]

## 2021-12-19 NOTE — Telephone Encounter (Signed)
Noted  

## 2021-12-19 NOTE — Patient Instructions (Addendum)
Be mindful  that certain foods have higher glycemic index than others especially the larger fruits.  Try to get in some form of moderate intensity exercise several days a week for 30 minutes.  We have started you on a low-dose of a medication called gabapentin 300 mg to take at bedtime to help decrease the symptoms that you are having in the left arm and hand.

## 2021-12-19 NOTE — Progress Notes (Signed)
Tingling in fingers and left arm that comes and goes.

## 2021-12-19 NOTE — Progress Notes (Addendum)
Patient ID: Rhonda Miles, female    DOB: 1951-04-14  MRN: [id number]  CC: Diabetes   Subjective: Rhonda Miles is a 71 y.o. female who presents for chronic ds management Her concerns today include:  Patient with history of DM type II, HL, IDA, obesity, HTN.   C/o intermittent tingling and numbness in LT arm and hand x 1 mth -last 5-10 mins.  She has to shake the arm to get it to go away. -not associated with any particular movement of hand or forearm.  No neck pain. No weakness in arm or grip -no associated chest pain.  Can occur at rest or with moving around.  Wakes her from sleep sometimes.   -pt does not drink ETOH beverages.   DM: Results for orders placed or performed in visit on 12/19/21  POCT glucose (manual entry)  Result Value Ref Range   POC Glucose 150 (A) 70 - 99 mg/dl  POCT glycosylated hemoglobin (Hb A1C)  Result Value Ref Range   Hemoglobin A1C     HbA1c POC (<> result, manual entry)     HbA1c, POC (prediabetic range)     HbA1c, POC (controlled diabetic range) 7.6 (A) 0.0 - 7.0 %  A1C down from 7.8 on last visit Checks BS 1-2 x day before meals.  Morning BS 110-135, before dinner range 90-121.  No low BS  -compliant with Amaryl 2 mg and Metformin 1 gram/500 mg/1 gram Eating habits "have been crazy."  Reports she has been eating a lot of water-mellon. Had a lot of soul food this past Memorial Day wkend.   Not getting in much exercise over past several wks. Some days she does not get off and home until 10 p.m Taking Lisinopril and Norvasc.  Limits salt in foods Checks BP regularly.  SBP stays below 130 Taking and tolerating Lipitor   Patient Active Problem List   Diagnosis Date Noted   Iron deficiency anemia 08/28/2019   Influenza vaccine needed 04/03/2019   Hyperlipidemia associated with type 2 diabetes mellitus ([place]) 04/03/2019   Essential hypertension 06/07/2017   Chronic posterior anal fissure 03/26/2017   Prolapsed internal hemorrhoids, grade  2 03/26/2017   Glaucoma (increased eye pressure) 07/12/2015   Snoring 07/12/2015   Bothersome menopausal vasomotor symptoms 06/03/2014   Diabetes mellitus due to underlying condition without complications ([place]) 98/33/8250   Obesity 02/13/2013     Current Outpatient Medications on File Prior to Visit  Medication Sig Dispense Refill   amLODipine (NORVASC) 10 MG tablet Take 1 tablet (10 mg total) by mouth daily. 90 tablet 2   atorvastatin (LIPITOR) 40 MG tablet Take 1 tablet (40 mg total) by mouth daily. 90 tablet 2   Blood Glucose Monitoring Suppl (ONETOUCH VERIO) w/Device KIT Use as directed to check blood sugar twice daily. E08.9 1 kit 0   Blood Pressure Monitor DEVI Use as directed to check home blood pressure 2-3 times a week 1 Device 0   Ferrous Sulfate (IRON) 325 (65 Fe) MG TABS Take 1 tablet by mouth twice a week 90 tablet 0   glimepiride (AMARYL) 2 MG tablet TAKE 1 TABLET BY MOUTH ONCE DAILY BEFORE BREAKFAST DOSE  INCREASE 90 tablet 3   glucose blood (ONETOUCH VERIO) test strip Use as directed to check blood sugar twice daily. E08.9 100 each 6   lisinopril (ZESTRIL) 20 MG tablet Take 1 tablet (20 mg total) by mouth daily. 90 tablet 1   metFORMIN (GLUCOPHAGE) 500 MG tablet TAKE 2 TABLETS  BY MOUTH AFTER BREAKFAST AND TAKE 1 TABLET BY MOUTH AFTER LUNCH AND 2 TABLETS AFTER DINNER 450 tablet 2   OneTouch Delica Lancets 45W MISC Use as directed to check blood sugar twice daily. E08.9 100 each 6   cholecalciferol (VITAMIN D) 1000 UNITS tablet Take 1,000 Units by mouth daily. (Patient not taking: Reported on 12/19/2021)     cyclobenzaprine (FLEXERIL) 5 MG tablet Take 1 tablet (5 mg total) by mouth daily as needed for muscle spasms. (Patient not taking: Reported on 12/19/2021) 30 tablet 0   No current facility-administered medications on file prior to visit.    Allergies  Allergen Reactions   Asa [Aspirin] Other (See Comments)    shakey    Social History   Socioeconomic History    Marital status: Single    Spouse name: Not on file   Number of children: Not on file   Years of education: Not on file   Highest education level: Not on file  Occupational History   Occupation: Caregiver    Employer: [employer]  Tobacco Use   Smoking status: Former    Types: Cigarettes    Quit date: 10/22/1983    Years since quitting: 38.1   Smokeless tobacco: Never  Vaping Use   Vaping Use: Never used  Substance and Sexual Activity   Alcohol use: No   Drug use: No   Sexual activity: Yes    Partners: Male  Other Topics Concern   Not on file  Social History Narrative   Personal care assistant   Married   Social Determinants of Health   Financial Resource Strain: Not on file  Food Insecurity: Not on file  Transportation Needs: Not on file  Physical Activity: Not on file  Stress: Not on file  Social Connections: Not on file  Intimate Partner Violence: Not on file    Family History  Problem Relation Age of Onset   Diabetes Father    Cancer - Other Father        FROM CHEMICAL EXPOSURE   Hypertension Mother    Colon cancer Maternal Grandmother 60   Breast cancer Maternal Aunt    Stomach cancer Neg Hx    Pancreatic cancer Neg Hx     Past Surgical History:  Procedure Laterality Date   BILATERAL SALPINGOOPHORECTOMY  11/05/2002   COLONOSCOPY  09/2013   LACERATION REPAIR Right 1957   ankle from lawnmower accident   [place]  11/05/2002   Dr. Matthew Saras, done for menorrhagia.  Benign pathology    ROS: Review of Systems Negative except as stated above  PHYSICAL EXAM: BP 121/68   Pulse 68   Ht '5\' 7"'  (1.702 m)   Wt 227 lb (103 kg)   SpO2 99%   BMI 35.55 kg/m   Wt Readings from Last 3 Encounters:  12/19/21 227 lb (103 kg)  08/14/21 231 lb 6.4 oz (105 kg)  04/14/21 227 lb (103 kg)    Physical Exam  General appearance - alert, well appearing, and in no distress Mental status - normal mood, behavior,  speech, dress, motor activity, and thought processes Chest - clear to auscultation, no wheezes, rales or rhonchi, symmetric air entry Heart - normal rate, regular rhythm, normal S1, S2, no murmurs, rubs, clicks or gallops Neurological -grip 5/5 bilaterally.  Power in the upper extremities 5/5 bilaterally proximally and distally.  No wasting of muscles of the intrinsic muscles of the hands noted.  Gross sensation intact in both  upper extremities.  Tinel's sign negative. Extremities -radial and brachial pulses are equal bilaterally.  Pulses are not diminished on elevation of the arms. No lower extremity edema. Diabetic Foot Exam - Simple   Simple Foot Form Visual Inspection No deformities, no ulcerations, no other skin breakdown bilaterally: Yes Sensation Testing Intact to touch and monofilament testing bilaterally: Yes Pulse Check Posterior Tibialis and Dorsalis pulse intact bilaterally: Yes Comments         Latest Ref Rng & Units 08/18/2021    2:42 PM 01/28/2020    4:11 PM 09/04/2019    4:19 PM  CMP  Glucose 70 - 99 mg/dL 129   76   185    BUN 8 - 27 mg/dL '10   13   10    ' Creatinine 0.57 - 1.00 mg/dL 0.80   0.57   0.68    Sodium 134 - 144 mmol/L 139   140   140    Potassium 3.5 - 5.2 mmol/L 4.6   4.2   4.1    Chloride 96 - 106 mmol/L 102   101   101    CO2 20 - 29 mmol/L '23   25   27    ' Calcium 8.7 - 10.3 mg/dL 9.8   9.4   9.6    Total Protein 6.0 - 8.5 g/dL 7.3    7.2    Total Bilirubin 0.0 - 1.2 mg/dL 0.7    0.5    Alkaline Phos 44 - 121 IU/L 87    84    AST 0 - 40 IU/L 19    16    ALT 0 - 32 IU/L 21    29     Lipid Panel     Component Value Date/Time   CHOL 116 11/30/2020 1625   TRIG 59 11/30/2020 1625   HDL 51 11/30/2020 1625   CHOLHDL 2.3 11/30/2020 1625   CHOLHDL 2.9 10/31/2015 1245   VLDL 15 10/31/2015 1245   LDLCALC 52 11/30/2020 1625    CBC    Component Value Date/Time   WBC 5.5 08/18/2021 1442   WBC 5.3 10/31/2015 1245   RBC 4.97 08/18/2021 1442   RBC  4.85 10/31/2015 1245   HGB 13.1 08/18/2021 1442   HCT 40.6 08/18/2021 1442   PLT 378 08/18/2021 1442   MCV 82 08/18/2021 1442   MCH 26.4 (L) 08/18/2021 1442   MCH 24.1 (L) 10/31/2015 1245   MCHC 32.3 08/18/2021 1442   MCHC 31.3 (L) 10/31/2015 1245   RDW 14.2 08/18/2021 1442   LYMPHSABS 2.4 10/26/2013 1154   MONOABS 0.3 10/26/2013 1154   EOSABS 0.1 10/26/2013 1154   BASOSABS 0.0 10/26/2013 1154    ASSESSMENT AND PLAN:  1. Type 2 diabetes mellitus with obesity (HCC) A1c improved but not at goal.  Patient wants to hold off on making any changes to medications at this time.  She plans to work on improving exercise activity and her eating habits.  We went over fruits that have high versus low glycemic index.  Advised that melons tend to have higher glycemic index.  I recommend eating it no more than once or twice a week in small portions. Continue Amaryl 2 mg daily and metformin 1000 mg / 500 mg/1000 mg - POCT glucose (manual entry) - POCT glycosylated hemoglobin (Hb A1C)  2. Hypertension associated with diabetes ([place]) Close to goal.  Continue amlodipine and lisinopril  3. Hyperlipidemia associated with type 2 diabetes mellitus (HCC) Continue atorvastatin.  Last LDL was 52 - Lipid panel  4. Other polyneuropathy Of questionable etiology.  Usually diabetic neuropathy is bilateral.  She denies any neck pain or discomfort.  Less likely to be carpal tunnel syndrome and that she has the tingling and numbness in the entire hand and up the forearm.  Check B12 level given that she has been on metformin for a while.  We will try her with low-dose gabapentin to see if this will decrease the frequency of the episodes.  Refer to neurology.  Will likely need nerve conduction study - Vitamin B12 - Ambulatory referral to Neurology - gabapentin (NEURONTIN) 300 MG capsule; Take 1 capsule (300 mg total) by mouth at bedtime.  Dispense: 30 capsule; Refill: 3    Patient was given the opportunity to ask  questions.  Patient verbalized understanding of the plan and was able to repeat key elements of the plan.   This documentation was completed using Radio producer.  Any transcriptional errors are unintentional.  Orders Placed This Encounter  Procedures   Lipid panel   Vitamin B12   Ambulatory referral to Neurology   POCT glucose (manual entry)   POCT glycosylated hemoglobin (Hb A1C)     Requested Prescriptions   Signed Prescriptions Disp Refills   gabapentin (NEURONTIN) 300 MG capsule 30 capsule 3    Sig: Take 1 capsule (300 mg total) by mouth at bedtime.    Return in about 4 months (around 04/21/2022).  Karle Plumber, MD, FACP

## 2021-12-20 ENCOUNTER — Encounter: Payer: Self-pay | Admitting: Internal Medicine

## 2021-12-20 ENCOUNTER — Telehealth: Payer: Self-pay | Admitting: Internal Medicine

## 2021-12-20 DIAGNOSIS — E538 Deficiency of other specified B group vitamins: Secondary | ICD-10-CM

## 2021-12-20 LAB — LIPID PANEL
Chol/HDL Ratio: 2.3 ratio (ref 0.0–4.4)
Cholesterol, Total: 100 mg/dL (ref 100–199)
HDL: 43 mg/dL (ref >39)
LDL Chol Calc (NIH): 43 mg/dL (ref 0–99)
Triglycerides: 61 mg/dL (ref 0–149)
VLDL Cholesterol Cal: 14 mg/dL (ref 5–40)

## 2021-12-20 LAB — VITAMIN B12: Vitamin B-12: 176 pg/mL — ABNORMAL LOW (ref 232–1245)

## 2021-12-20 MED ORDER — CYANOCOBALAMIN 1000 MCG/ML IJ SOLN
1000.0000 ug | Freq: Once | INTRAMUSCULAR | Status: AC
  Administered 2021-12-28: 1000 ug via INTRAMUSCULAR

## 2021-12-20 NOTE — Telephone Encounter (Signed)
Patient was called and scheduled a nurse visit.

## 2021-12-20 NOTE — Telephone Encounter (Signed)
Phone call placed to patient today to go over lab results. Patient informed that her vitamin B12 level is low.  Low B12 can cause numbness, tingling and weakness in the extremities.  This may or may not be causing the numbness that she is having in her left arm and hand.  However we do need to treat it. Advised that we will do B12 injections once a week for 2 weeks.  1 week after the last shot, she should start taking vitamin B12 supplement 1000 mcg daily.  This can be purchased over-the-counter. Pt expressed understanding and was able to repeat back instructions correctly. I will have CMA call her to schedule.

## 2021-12-28 ENCOUNTER — Ambulatory Visit: Payer: Medicare (Managed Care) | Attending: Internal Medicine

## 2021-12-28 DIAGNOSIS — E538 Deficiency of other specified B group vitamins: Secondary | ICD-10-CM | POA: Diagnosis not present

## 2022-01-07 ENCOUNTER — Emergency Department (HOSPITAL_COMMUNITY)
Admission: EM | Admit: 2022-01-07 | Discharge: 2022-01-08 | Disposition: A | Discharge status: Home / Self Care | Payer: Medicare (Managed Care) | Attending: Emergency Medicine | Admitting: Emergency Medicine

## 2022-01-07 ENCOUNTER — Encounter (HOSPITAL_COMMUNITY): Payer: Self-pay

## 2022-01-07 DIAGNOSIS — M5441 Lumbago with sciatica, right side: Secondary | ICD-10-CM | POA: Insufficient documentation

## 2022-01-07 DIAGNOSIS — Z7984 Long term (current) use of oral hypoglycemic drugs: Secondary | ICD-10-CM | POA: Diagnosis not present

## 2022-01-07 DIAGNOSIS — E119 Type 2 diabetes mellitus without complications: Secondary | ICD-10-CM | POA: Diagnosis not present

## 2022-01-07 DIAGNOSIS — M549 Dorsalgia, unspecified: Secondary | ICD-10-CM | POA: Diagnosis present

## 2022-01-07 DIAGNOSIS — Z79899 Other long term (current) drug therapy: Secondary | ICD-10-CM | POA: Insufficient documentation

## 2022-01-07 DIAGNOSIS — I10 Essential (primary) hypertension: Secondary | ICD-10-CM | POA: Diagnosis not present

## 2022-01-07 DIAGNOSIS — M5431 Sciatica, right side: Secondary | ICD-10-CM

## 2022-01-07 NOTE — ED Triage Notes (Signed)
Pt reports that she has sciatica on the R side, has been to UC several times and given gabapentin and percocet without relief

## 2022-01-08 MED ORDER — KETOROLAC TROMETHAMINE 30 MG/ML IJ SOLN
30.0000 mg | Freq: Once | INTRAMUSCULAR | Status: AC
  Administered 2022-01-08: 30 mg via INTRAMUSCULAR
  Filled 2022-01-08: qty 1

## 2022-01-08 MED ORDER — METHOCARBAMOL 1000 MG PO TABS: 1000.0000 mg | ORAL_TABLET | Freq: Three times a day (TID) | ORAL | 0 refills | Status: DC | PRN

## 2022-01-08 MED ORDER — MORPHINE SULFATE 15 MG PO TABS: 15.0000 mg | ORAL_TABLET | ORAL | 0 refills | Status: DC | PRN

## 2022-01-08 MED ORDER — HYDROMORPHONE HCL 1 MG/ML IJ SOLN
2.0000 mg | Freq: Once | INTRAMUSCULAR | Status: AC
  Administered 2022-01-08: 2 mg via INTRAMUSCULAR
  Filled 2022-01-08 (×2): qty 2

## 2022-01-08 MED ORDER — LIDOCAINE 5 % EX PTCH: 1.0000 | MEDICATED_PATCH | CUTANEOUS | 0 refills | Status: AC

## 2022-01-08 NOTE — Discharge Instructions (Addendum)
If you develop worsening, recurrent, or continued back pain, numbness or weakness in the legs, incontinence of your bowels or bladders, numbness of your buttocks, fever, abdominal pain, or any other new/concerning symptoms then return to the ER for evaluation.   We are changing up your medicines a little.  You are being started on the morphine, thus stop the hydrocodone.  You are also being given Robaxin which is a muscle relaxer and so stopped the Flexeril/cyclobenzaprine.  I am also giving you a lidocaine patch which you can put on the place that is the most severe and where you feel like the pain starts

## 2022-01-08 NOTE — ED Provider Notes (Signed)
[facility] EMERGENCY DEPARTMENT Provider Note   CSN: [id number] Arrival date & time: 01/07/22  2024     History  Chief Complaint  Patient presents with   Sciatica    Rhonda Miles is a 71 y.o. female.  HPI 71 year old female presents with right-sided back pain going into her toes.  Has been present for about 1 week.  She has been to urgent care twice.  Was started on a steroid pack which she is almost done with.  Has also been given Flexeril, hydrocodone, and gabapentin.  Has been taking Aleve as well.  Pain seems to be unrelenting.  Her right leg on the lateral aspect feels a little different than the left but no weakness associated with this.  No fevers, new night sweats, weight loss, or bowel/bladder incontinence.  Pain is severe.  She presents here due to the intractable pain.  No trauma associated with this.  She has had minor issues with her back but nothing this lasted this long.  She does have a history of type 2 diabetes, hyperlipidemia, hypertension.  Home Medications Prior to Admission medications   Medication Sig Start Date End Date Taking? Authorizing Provider  lidocaine (LIDODERM) 5 % Place 1 patch onto the skin daily. Remove & Discard patch within 12 hours or as directed by MD 01/08/22  Yes Sherwood Gambler, MD  methocarbamol 1000 MG TABS Take 1,000 mg by mouth every 8 (eight) hours as needed for muscle spasms. 01/08/22  Yes Sherwood Gambler, MD  morphine (MSIR) 15 MG tablet Take 1 tablet (15 mg total) by mouth every 4 (four) hours as needed for severe pain. 01/08/22  Yes Sherwood Gambler, MD  amLODipine (NORVASC) 10 MG tablet Take 1 tablet (10 mg total) by mouth daily. 03/28/21   Ladell Pier, MD  atorvastatin (LIPITOR) 40 MG tablet Take 1 tablet (40 mg total) by mouth daily. 10/23/21   Ladell Pier, MD  Blood Glucose Monitoring Suppl (ONETOUCH VERIO) w/Device KIT Use as directed to check blood sugar twice daily. E08.9 08/28/19   Ladell Pier, MD  Blood Pressure Monitor DEVI Use as directed to check home blood pressure 2-3 times a week 12/26/18   Ladell Pier, MD  cholecalciferol (VITAMIN D) 1000 UNITS tablet Take 1,000 Units by mouth daily. Patient not taking: Reported on 12/19/2021    [provider]  Ferrous Sulfate (IRON) 325 (65 Fe) MG TABS Take 1 tablet by mouth twice a week 12/07/21   Ladell Pier, MD  gabapentin (NEURONTIN) 300 MG capsule Take 1 capsule (300 mg total) by mouth at bedtime. 12/19/21   Ladell Pier, MD  glimepiride (AMARYL) 2 MG tablet TAKE 1 TABLET BY MOUTH ONCE DAILY BEFORE BREAKFAST DOSE  INCREASE 08/14/21   Ladell Pier, MD  glucose blood (ONETOUCH VERIO) test strip Use as directed to check blood sugar twice daily. E08.9 01/29/20   Camillia Herter, NP  lisinopril (ZESTRIL) 20 MG tablet Take 1 tablet (20 mg total) by mouth daily. 10/23/21   Ladell Pier, MD  metFORMIN (GLUCOPHAGE) 500 MG tablet TAKE 2 TABLETS BY MOUTH AFTER BREAKFAST AND TAKE 1 TABLET BY MOUTH AFTER LUNCH AND 2 TABLETS AFTER DINNER 05/29/21   Ladell Pier, MD  OneTouch Delica Lancets 53G MISC Use as directed to check blood sugar twice daily. E08.9 01/29/20   Camillia Herter, NP      Allergies    Diona Fanti [aspirin]    Review of Systems  Review of Systems  Constitutional:  Negative for fever.  Gastrointestinal:  Negative for abdominal pain.  Genitourinary:        No incontinence  Musculoskeletal:  Positive for back pain.  Neurological:  Positive for numbness. Negative for weakness.    Physical Exam Updated Vital Signs BP 135/74 (BP Location: Left Arm)   Pulse 88   Temp 97.7 F (36.5 C) (Oral)   Resp 17   SpO2 96%  Physical Exam Vitals and nursing note reviewed.  Constitutional:      Appearance: She is well-developed.  HENT:     Head: Normocephalic and atraumatic.  Cardiovascular:     Rate and Rhythm: Normal rate and regular rhythm.     Pulses:          Dorsalis pedis pulses are 2+ on  the right side.  Pulmonary:     Effort: Pulmonary effort is normal.  Abdominal:     Palpations: Abdomen is soft.     Tenderness: There is no abdominal tenderness.  Musculoskeletal:     Lumbar back: Tenderness present.       Back:  Skin:    General: Skin is warm and dry.  Neurological:     Mental Status: She is alert.     Deep Tendon Reflexes:     Reflex Scores:      Patellar reflexes are 2+ on the right side and 2+ on the left side.      Achilles reflexes are 2+ on the right side and 2+ on the left side.    Comments: 5/5 strength in BLE, though the right is limited due to pain.  On the lateral aspect of her right leg, she can feel me touching though states it feels different compared to the left.     ED Results / Procedures / Treatments   Labs (all labs ordered are listed, but only abnormal results are displayed) Labs Reviewed - No data to display  EKG None  Radiology No results found.  Procedures Procedures    Medications Ordered in ED Medications  HYDROmorphone (DILAUDID) injection 2 mg (2 mg Intramuscular Given 01/08/22 0734)  ketorolac (TORADOL) 30 MG/ML injection 30 mg (30 mg Intramuscular Given 01/08/22 0734)    ED Course/ Medical Decision Making/ A&P                           Medical Decision Making Amount and/or Complexity of Data Reviewed External Data Reviewed: notes.    Details: Urgent care  Risk Prescription drug management.   Patient appears to have right-sided sciatica.  While she does have some subjective sensory changes to the lateral aspect of her right leg, she has intact reflexes and good strength.  After IM pain control with Dilaudid and Toradol she is able to get up and walk.  She feels a lot better and well enough to be discharged.  I do not think acute imaging is warranted on an emergency basis as there does not appear to be any red flags for cauda equina.  No weight loss.  I do not think an x-ray would be helpful.  However she will need  outpatient imaging and I have stressed this with her.  We discussed return precautions.  However given that her pain is controlled, I do not think she needs emergency surgery or imaging or admission.  Will refer to neurosurgery.  We will change her medicines a little bit to see if there is any  better pain control.  She is about to start physical therapy soon as well.  She is almost at the end of a steroid Dosepak which did not seem to help and made her glucose a little higher so I do not think a repeat steroid dose is warranted.        Final Clinical Impression(s) / ED Diagnoses Final diagnoses:  Right sided sciatica    Rx / DC Orders ED Discharge Orders          Ordered    morphine (MSIR) 15 MG tablet  Every 4 hours PRN        01/08/22 0823    methocarbamol 1000 MG TABS  Every 8 hours PRN        01/08/22 0823    lidocaine (LIDODERM) 5 %  Every 24 hours        01/08/22 6999              Sherwood Gambler, MD 01/08/22 [id number]

## 2022-01-09 ENCOUNTER — Telehealth: Payer: Self-pay | Admitting: Internal Medicine

## 2022-01-09 ENCOUNTER — Encounter (HOSPITAL_COMMUNITY): Payer: Self-pay

## 2022-01-09 ENCOUNTER — Other Ambulatory Visit: Payer: Self-pay

## 2022-01-09 ENCOUNTER — Ambulatory Visit: Payer: Self-pay

## 2022-01-09 ENCOUNTER — Emergency Department (HOSPITAL_COMMUNITY): Payer: Medicare (Managed Care)

## 2022-01-09 ENCOUNTER — Emergency Department (HOSPITAL_COMMUNITY)
Admission: EM | Admit: 2022-01-09 | Discharge: 2022-01-09 | Disposition: A | Discharge status: Home / Self Care | Payer: Medicare (Managed Care) | Attending: Emergency Medicine | Admitting: Emergency Medicine

## 2022-01-09 ENCOUNTER — Ambulatory Visit: Payer: Self-pay | Admitting: *Deleted

## 2022-01-09 DIAGNOSIS — M545 Low back pain, unspecified: Secondary | ICD-10-CM | POA: Diagnosis present

## 2022-01-09 DIAGNOSIS — M5441 Lumbago with sciatica, right side: Secondary | ICD-10-CM | POA: Insufficient documentation

## 2022-01-09 DIAGNOSIS — E119 Type 2 diabetes mellitus without complications: Secondary | ICD-10-CM | POA: Insufficient documentation

## 2022-01-09 DIAGNOSIS — Z7984 Long term (current) use of oral hypoglycemic drugs: Secondary | ICD-10-CM | POA: Diagnosis not present

## 2022-01-09 DIAGNOSIS — M5431 Sciatica, right side: Secondary | ICD-10-CM

## 2022-01-09 MED ORDER — CYCLOBENZAPRINE HCL 10 MG PO TABS: 10.0000 mg | ORAL_TABLET | Freq: Two times a day (BID) | ORAL | 0 refills | Status: DC | PRN

## 2022-01-09 MED ORDER — FENTANYL CITRATE PF 50 MCG/ML IJ SOSY
50.0000 ug | PREFILLED_SYRINGE | Freq: Once | INTRAMUSCULAR | Status: AC
  Administered 2022-01-09: 50 ug via INTRAMUSCULAR
  Filled 2022-01-09: qty 1

## 2022-01-09 MED ORDER — PREDNISONE 20 MG PO TABS: 40.0000 mg | ORAL_TABLET | Freq: Every day | ORAL | 0 refills | Status: DC

## 2022-01-09 MED ORDER — FENTANYL CITRATE PF 50 MCG/ML IJ SOSY: 50.0000 ug | PREFILLED_SYRINGE | Freq: Once | INTRAMUSCULAR | Status: DC

## 2022-01-09 MED ORDER — KETOROLAC TROMETHAMINE 15 MG/ML IJ SOLN
15.0000 mg | Freq: Once | INTRAMUSCULAR | Status: AC
Start: 2022-01-09 — End: 2022-01-09
  Administered 2022-01-09: 15 mg via INTRAMUSCULAR
  Filled 2022-01-09: qty 1

## 2022-01-09 MED ORDER — KETOROLAC TROMETHAMINE 15 MG/ML IJ SOLN: 15.0000 mg | Freq: Once | INTRAMUSCULAR | Status: DC

## 2022-01-09 NOTE — Telephone Encounter (Signed)
Previous message has been sent to provider covering PCP. Will address concerns and follow up with patient once response is sent.   Called patient to let her know. She states she is in the ED right now.

## 2022-01-09 NOTE — Telephone Encounter (Signed)
  Chief Complaint: back pain Symptoms: back pain Frequency: May  Pertinent Negatives: Patient denies  Disposition: [x] ED /[] Urgent Care (no appt availability in office) / [] Appointment(In office/virtual)/ []  [facility]/ [] Home Care/ [] Refused Recommended Disposition /[] [facility]/ []  Follow-up with PCP Additional Notes: Pt is continuing back pain of 10/10. Neurologist cannot see pt until August. Is there anyway that Dr. can encourage neurologist to see pt sooner?  Reason for Disposition  [1] SEVERE abdominal pain AND [2] present > 1 hour  Answer Assessment - Initial Assessment Questions 1. ONSET: "When did the pain begin?"      May 2023  2. LOCATION: "Where does it hurt?" (upper, mid or lower back)     lower 3. SEVERITY: "How bad is the pain?"  (e.g., Scale 1-10; mild, moderate, or severe)   - MILD (1-3): doesn't interfere with normal activities    - MODERATE (4-7): interferes with normal activities or awakens from sleep    - SEVERE (8-10): excruciating pain, unable to do any normal activities      10/10 4. PATTERN: "Is the pain constant?" (e.g., yes, no; constant, intermittent)      yes 5. RADIATION: "Does the pain shoot into your legs or elsewhere?"     Shoot down 6. CAUSE:  "What do you think is causing the back pain?"      Sciatica 7. BACK OVERUSE:  "Any recent lifting of heavy objects, strenuous work or exercise?"      8. MEDICATIONS: "What have you taken so far for the pain?" (e.g., nothing, acetaminophen, NSAIDS)     Pain meds, muscle relaxer 9. NEUROLOGIC SYMPTOMS: "Do you have any weakness, numbness, or problems with bowel/bladder control?"      10. OTHER SYMPTOMS: "Do you have any other symptoms?" (e.g., fever, abdominal pain, burning with urination, blood in urine)        11. PREGNANCY: "Is there any chance you are pregnant?" (e.g., yes, no; LMP)       na  Protocols used: Back Pain-A-AH

## 2022-01-09 NOTE — ED Triage Notes (Signed)
Pt arrived POV with c/c of Leg Pain. Pt stated she was seen Sunday for same. Pt stated she was given muscle relaxer but did not help which led her back in today.

## 2022-01-09 NOTE — ED Provider Notes (Signed)
[facility] EMERGENCY DEPARTMENT Provider Note   CSN: [id number] Arrival date & time: 01/09/22  1248     History  Chief Complaint  Patient presents with   Leg Pain    Rhonda Miles is a 71 y.o. female with past medical history significant for diabetes, obesity, hyperlipidemia, known history of sciatica who presents with concern for ongoing right hip, lumbar back pain.  She has been seen twice by urgent care and once in the emergency department.  She has completed a steroid burst, which led to some improvement of symptoms but also increase in blood sugar, she has taken muscle relaxant, morphine, hydrocodone, as well as Tylenol.  Patient reports some sensory deficits, extreme shooting pain.  She denies any known injury.  She denies any saddle anesthesia, urinary or fecal incontinence or retention.    Leg Pain Associated symptoms: back pain        Home Medications Prior to Admission medications   Medication Sig Start Date End Date Taking? Authorizing Provider  cyclobenzaprine (FLEXERIL) 10 MG tablet Take 1 tablet (10 mg total) by mouth 2 (two) times daily as needed for muscle spasms. 01/09/22  Yes Alee Katen H, PA-C  predniSONE (DELTASONE) 20 MG tablet Take 2 tablets (40 mg total) by mouth daily. 01/09/22  Yes Creig Landin H, PA-C  amLODipine (NORVASC) 10 MG tablet Take 1 tablet (10 mg total) by mouth daily. 03/28/21   Ladell Pier, MD  atorvastatin (LIPITOR) 40 MG tablet Take 1 tablet (40 mg total) by mouth daily. 10/23/21   Ladell Pier, MD  Blood Glucose Monitoring Suppl (ONETOUCH VERIO) w/Device KIT Use as directed to check blood sugar twice daily. E08.9 08/28/19   Ladell Pier, MD  Blood Pressure Monitor DEVI Use as directed to check home blood pressure 2-3 times a week 12/26/18   Ladell Pier, MD  cholecalciferol (VITAMIN D) 1000 UNITS tablet Take 1,000 Units by mouth daily. Patient not taking: Reported on 12/19/2021     [provider]  Ferrous Sulfate (IRON) 325 (65 Fe) MG TABS Take 1 tablet by mouth twice a week 12/07/21   Ladell Pier, MD  gabapentin (NEURONTIN) 300 MG capsule Take 1 capsule (300 mg total) by mouth at bedtime. 12/19/21   Ladell Pier, MD  glimepiride (AMARYL) 2 MG tablet TAKE 1 TABLET BY MOUTH ONCE DAILY BEFORE BREAKFAST DOSE  INCREASE 08/14/21   Ladell Pier, MD  glucose blood (ONETOUCH VERIO) test strip Use as directed to check blood sugar twice daily. E08.9 01/29/20   Camillia Herter, NP  lidocaine (LIDODERM) 5 % Place 1 patch onto the skin daily. Remove & Discard patch within 12 hours or as directed by MD 01/08/22   [place] Gambler, MD  lisinopril (ZESTRIL) 20 MG tablet Take 1 tablet (20 mg total) by mouth daily. 10/23/21   Ladell Pier, MD  metFORMIN (GLUCOPHAGE) 500 MG tablet TAKE 2 TABLETS BY MOUTH AFTER BREAKFAST AND TAKE 1 TABLET BY MOUTH AFTER LUNCH AND 2 TABLETS AFTER DINNER 05/29/21   Ladell Pier, MD  morphine (MSIR) 15 MG tablet Take 1 tablet (15 mg total) by mouth every 4 (four) hours as needed for severe pain. 01/08/22   [place] Gambler, MD  OneTouch Delica Lancets 14E MISC Use as directed to check blood sugar twice daily. E08.9 01/29/20   Camillia Herter, NP      Allergies    Diona Fanti [aspirin]    Review of Systems  Review of Systems  Musculoskeletal:  Positive for back pain and gait problem.  All other systems reviewed and are negative.   Physical Exam Updated Vital Signs BP 130/65 (BP Location: Left Arm)   Pulse 96   Temp 98.3 F (36.8 C) (Oral)   Resp 16   Ht '5\' 7"'  (1.702 m)   Wt 103 kg   SpO2 97%   BMI 35.55 kg/m  Physical Exam Vitals and nursing note reviewed.  Constitutional:      General: She is not in acute distress.    Appearance: Normal appearance.  HENT:     Head: Normocephalic and atraumatic.  Eyes:     General:        Right eye: No discharge.        Left eye: No discharge.  Cardiovascular:     Rate and Rhythm:  Normal rate and regular rhythm.  Pulmonary:     Effort: Pulmonary effort is normal. No respiratory distress.  Musculoskeletal:        General: No deformity.     Comments: Tenderness palpation of the trochanteric bursa, as well as right lumbar paraspinous muscles.  Some midline lumbar spinous tenderness.  Positive straight leg raise.  Patient endorses sensory deficit of right leg compared to left leg.  Skin:    General: Skin is warm and dry.  Neurological:     Mental Status: She is alert and oriented to person, place, and time.  Psychiatric:        Mood and Affect: Mood normal.        Behavior: Behavior normal.     ED Results / Procedures / Treatments   Labs (all labs ordered are listed, but only abnormal results are displayed) Labs Reviewed - No data to display  EKG None  Radiology MR LUMBAR SPINE WO CONTRAST  Result Date: 01/09/2022 CLINICAL DATA:  Lumbar radiculopathy EXAM: MRI LUMBAR SPINE WITHOUT CONTRAST TECHNIQUE: Multiplanar, multisequence MR imaging of the lumbar spine was performed. No intravenous contrast was administered. COMPARISON:  None Available. FINDINGS: Segmentation:  Standard. Alignment:  Physiologic. Vertebrae:  No fracture, evidence of discitis, or bone lesion. Conus medullaris and cauda equina: Conus extends to the L1 level. Conus and cauda equina appear normal. Paraspinal and other soft tissues: Negative. Disc levels: T12- L1 - L2-L3: Minor disc bulging. L3-L4: Foraminal predominant disc bulging with left foraminal annular fissure. L4-L5: Mild disc bulging. L5-S1:Disc narrowing and bulging with endplate spurring. Right paracentral extrusion migrating inferiorly impinging on the right S1 nerve root. Degenerative facet spurring bilaterally. IMPRESSION: Symptomatic finding is likely at L5-S1 where there is a right paracentral extrusion impinging on the right S1 nerve root. Electronically Signed   By: Jorje Guild M.D.   On: 01/09/2022 17:39     Procedures Procedures    Medications Ordered in ED Medications  fentaNYL (SUBLIMAZE) injection 50 mcg (50 mcg Intramuscular Given 01/09/22 1548)  ketorolac (TORADOL) 15 MG/ML injection 15 mg (15 mg Intramuscular Given 01/09/22 1548)    ED Course/ Medical Decision Making/ A&P                           Medical Decision Making  This patient is a 71 y.o. female who presents to the ED for concern of ongoing right hip/lower back pain, this involves an extensive number of treatment options, and is a complaint that carries with it a high risk of complications and morbidity. The emergent differential diagnosis prior to evaluation includes,  but is not limited to, sciatica as well as seen and diagnosed at patient's emergency department appointment yesterday, versus compression fracture versus osteomyelitis, epidural abscess, central cord compression, or other significant spinal pathology.  Also considered trochanteric bursitis versus other.   This is not an exhaustive differential.   Past Medical History / Co-morbidities / Social History: diabetes, obesity, hyperlipidemia, known history of sciatica   Additional history: Chart reviewed. Pertinent results include: Reviewed lab work, imaging, clinical decision making from previous emergency department and urgent care visits as well as outpatient family medicine visits.  Physical Exam: Physical exam performed. The pertinent findings include: Patient with positive straight leg raise, no significant midline spinal tenderness but paraspinous tenderness of the right lumbar spine and right trochanteric bursa.   Imaging Studies: This patient was seen and evaluated for similar symptoms recently with no significant improvement of her pain, questionable neurologic deficits of sensory discrepancy, weakness think that it is reasonable to progress to an MRI of the lumbar spine at this time.  I ordered imaging studies including MRI lumbar spine. I independently  visualized and interpreted imaging which showed evidence of disc herniation with L1 nerve compression.. I agree with the radiologist interpretation.   Medications: I ordered medication including Toradol, fentanyl for pain. Reevaluation of the patient after these medicines showed that the patient improved. I have reviewed the patients home medicines and have made adjustments as needed.   Disposition: After consideration of the diagnostic results and the patients response to treatment, I feel that patient's pain and symptoms are consistent with sciatica as was previously diagnosed.  I would not recommend significant change in her management at this time.  Patient reports that she cannot afford muscle relaxant, I will refill her other muscle relaxant that she says is more affordable.  Despite some elevation of blood sugar with previous steroid taper, patient without significant hypertension, and reports that her blood sugar has been reasonable for the last few days, think it is reasonable to try another steroid burst, but encouraged the patient will need rehab, neurosurgery follow-up.   Emergency department workup does not suggest an emergent condition requiring admission or immediate intervention beyond what has been performed at this time. The patient is safe for discharge and has been instructed to return immediately for worsening symptoms, change in symptoms or any other concerns.  I discussed this case with my attending physician Dr. Alvino Chapel who cosigned this note including patient's presenting symptoms, physical exam, and planned diagnostics and interventions. Attending physician stated agreement with plan or made changes to plan which were implemented.    Final Clinical Impression(s) / ED Diagnoses Final diagnoses:  Sciatica of right side    Rx / DC Orders ED Discharge Orders          Ordered    predniSONE (DELTASONE) 20 MG tablet  Daily        01/09/22 1755    cyclobenzaprine (FLEXERIL)  10 MG tablet  2 times daily PRN        01/09/22 1755              Maxima Skelton, Jackalyn Lombard 01/09/22 Luna Glasgow, MD 01/09/22 2359

## 2022-01-09 NOTE — Discharge Instructions (Addendum)
Continue taking your at home pain medications including muscle relaxant, narcotic pain medication, lidocaine patches, gabapentin.  Try to do the rehab exercises provided above when you are able to.  You can use Tylenol 1000 mg up to every 6 hours.  Maximum 4000 milligrams per day.  Additionally you can take ibuprofen 600 to 800 mg every 6 hours alternating.  I recommend trying to arrange follow-up with the neurosurgeon as we discussed.  In the meantime I recommend that you monitor your blood sugar while taking steroid pack.

## 2022-01-09 NOTE — Telephone Encounter (Signed)
  Chief Complaint: sciatica pain Symptoms: pain hip to foot Frequency: constant Pertinent Negatives: Patient denies fever, B or B involvement Disposition: [] ED /[] Urgent Care (no appt availability in office) / [] Appointment(In office/virtual)/ []  [facility]/ [] Home Care/ [] Refused Recommended Disposition /[] [place] [facility]/ [x]  Follow-up with PCP Additional Notes: Pt has been to UC twice and ED, PCP has made referral which I see the referral note states must see PCP first. Pt was seen for polyneuropathy on 12/19/21 by PCP. Pt informed that referral sent and she can call and make appt.  Reason for Disposition  [1] Age > 50 AND [2] no history of prior similar back pain  Answer Assessment - Initial Assessment Questions 1. ONSET: "When did the pain begin?"      Over a week 2. LOCATION: "Where does it hurt?" (upper, mid or lower back)     From lower back down whole leg 3. SEVERITY: "How bad is the pain?"  (e.g., Scale 1-10; mild, moderate, or severe)   - MILD (1-3): doesn't interfere with normal activities    - MODERATE (4-7): interferes with normal activities or awakens from sleep    - SEVERE (8-10): excruciating pain, unable to do any normal activities      Severe 4. PATTERN: "Is the pain constant?" (e.g., yes, no; constant, intermittent)      throbbing 5. RADIATION: "Does the pain shoot into your legs or elsewhere?"     Down legs 6. CAUSE:  "What do you think is causing the back pain?"      ED stated it is sciatica 7. BACK OVERUSE:  "Any recent lifting of heavy objects, strenuous work or exercise?"     no 8. MEDICATIONS: "What have you taken so far for the pain?" (e.g., nothing, acetaminophen, NSAIDS)     Muscle relaxants, ED gave pain shots, taking NSAID and muscle relaxant 9. NEUROLOGIC SYMPTOMS: "Do you have any weakness, numbness, or problems with bowel/bladder control?"     Little numbness, told ED doctor 10. OTHER SYMPTOMS: "Do you have any other symptoms?"  (e.g., fever, abdominal pain, burning with urination, blood in urine)       no 11. PREGNANCY: "Is there any chance you are pregnant?" (e.g., yes, no; LMP)       no  Protocols used: Back Pain-A-AH

## 2022-01-09 NOTE — ED Notes (Signed)
Patient transported to MRI 

## 2022-01-09 NOTE — Telephone Encounter (Addendum)
Referral Request - Has patient seen PCP for this complaint? No. *If NO, is insurance requiring patient see PCP for this issue before PCP can refer them? Patient is not sure Referral for which specialty: Neurologist Preferred provider/office: Patient doesn't have a specific place in mind Reason for referral: Patient has been seen at Urgent Care and the ED for this specific problem and says it is only a temporary fix and would like a referral to a specialist to address this issue.

## 2022-01-09 NOTE — Telephone Encounter (Signed)
Patient given information from referral to Neurologist office below.   Sent Referral  to [facility] [address], [address] [place], [place] [address] Phone: [phone] Fax: [phone]

## 2022-01-11 ENCOUNTER — Ambulatory Visit: Payer: Medicare (Managed Care) | Attending: Internal Medicine

## 2022-01-11 DIAGNOSIS — E538 Deficiency of other specified B group vitamins: Secondary | ICD-10-CM | POA: Diagnosis not present

## 2022-01-11 MED ORDER — CYANOCOBALAMIN 1000 MCG/ML IJ SOLN
1000.0000 ug | Freq: Once | INTRAMUSCULAR | Status: AC
  Administered 2022-01-11: 1000 ug via INTRAMUSCULAR

## 2022-01-20 DIAGNOSIS — S2232XA Fracture of one rib, left side, initial encounter for closed fracture: Secondary | ICD-10-CM | POA: Diagnosis not present

## 2022-01-20 DIAGNOSIS — S3991XA Unspecified injury of abdomen, initial encounter: Secondary | ICD-10-CM | POA: Diagnosis not present

## 2022-01-20 DIAGNOSIS — G8911 Acute pain due to trauma: Secondary | ICD-10-CM | POA: Diagnosis not present

## 2022-01-20 DIAGNOSIS — R000 Tachycardia, unspecified: Secondary | ICD-10-CM | POA: Diagnosis not present

## 2022-02-01 ENCOUNTER — Telehealth: Payer: Self-pay | Admitting: Emergency Medicine

## 2022-02-01 DIAGNOSIS — M79604 Pain in right leg: Secondary | ICD-10-CM | POA: Diagnosis not present

## 2022-02-01 DIAGNOSIS — M545 Low back pain, unspecified: Secondary | ICD-10-CM | POA: Diagnosis not present

## 2022-02-01 DIAGNOSIS — R262 Difficulty in walking, not elsewhere classified: Secondary | ICD-10-CM | POA: Diagnosis not present

## 2022-02-01 DIAGNOSIS — R202 Paresthesia of skin: Secondary | ICD-10-CM | POA: Diagnosis not present

## 2022-02-01 NOTE — Telephone Encounter (Signed)
Copied from CRM [id number]. Topic: General - Inquiry >> Feb 01, 2022  2:59 PM De [place]anch wrote: Reason for CRM: Pt is calling to follow up on referral for neurology. Pt is requesting a new referral be sent so she can be seen. Pt mentioned she has new insurance through [employer].  Pt mentioned she doesn't care where referral are sent, just within her network.  Pt is requesting a call back.  Please advise.

## 2022-02-06 ENCOUNTER — Ambulatory Visit: Payer: Medicare PPO | Attending: Physician Assistant | Admitting: Internal Medicine

## 2022-02-06 ENCOUNTER — Encounter: Payer: Self-pay | Admitting: Internal Medicine

## 2022-02-06 VITALS — BP 104/68 | HR 109 | Temp 98.1°F | Resp 16 | Wt 219.0 lb

## 2022-02-06 DIAGNOSIS — M5416 Radiculopathy, lumbar region: Secondary | ICD-10-CM | POA: Diagnosis not present

## 2022-02-06 DIAGNOSIS — S2232XA Fracture of one rib, left side, initial encounter for closed fracture: Secondary | ICD-10-CM | POA: Diagnosis not present

## 2022-02-06 MED ORDER — TRAMADOL HCL 50 MG PO TABS: 50.0000 mg | ORAL_TABLET | Freq: Two times a day (BID) | ORAL | 0 refills | Status: DC | PRN

## 2022-02-06 NOTE — Progress Notes (Signed)
Patient ID: Rhonda Miles, female    DOB: 1950/08/22  MRN: [id number]  CC: Rib Injury   Subjective: Rhonda Miles is a 71 y.o. female who presents for UC visit Her concerns today include:  Patient with history of DM type II, HL, IDA, obesity, HTN.   Slip and fell at home last Sunday in June when she went walking down her dark hallway to use the bathroom.  Foot hit garbage bags. Tried to break fall and fell on gallon bucket of paint that was in the hall way. Did not start hurting until 3 days later.  Seen in ER in [place] and had imaging done. Told she had one rib fx LT side'  Given Hydrocodone and Lidocaine patch.  She purchased the patch OTC.   -still hurts some 7/10, goes and comes.  Worse when she coughs Wearing a waist band to help hold it today.  Avoids lifting on LT side.   Out of pain medication  Seen in ER 01/09/22 for LBP with RT sided sciatica. Had MRI that showed right paracentral extrusion at L5-S1 impinging on the right S1 nerve root Referred to P.T, started last wk.  Going 1/wk Has shooting pain on lateral aspect RT leg.  Reports numbness in RT lateral calf.  Little numbness/tingling lateral foot and toes Worse with prolong sitting Rates pain 4/10 Gabapentin helps.  Some days she takes BID No loss of bowel or bladder function.  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 90 tablet 2   atorvastatin (LIPITOR) 40 MG tablet Take 1 tablet (40 mg total) by mouth daily. 90 tablet 2   Blood Glucose Monitoring Suppl (ONETOUCH VERIO) w/Device KIT Use as directed to check blood sugar twice daily. E08.9 1 kit 0   Blood Pressure Monitor DEVI Use as directed to check home blood pressure 2-3 times a week 1 Device 0   cholecalciferol (VITAMIN D) 1000 UNITS tablet Take 1,000 Units by mouth daily.     cyclobenzaprine (FLEXERIL) 10 MG tablet Take 1 tablet (10 mg total) by mouth 2 (two) times daily as needed for muscle spasms. 20 tablet 0   Ferrous Sulfate (IRON) 325 (65 Fe) MG TABS Take 1 tablet by mouth twice a week 90 tablet 0   gabapentin (NEURONTIN) 300 MG capsule Take 1 capsule (300 mg total) by mouth at bedtime. 30 capsule 3   glimepiride (AMARYL) 2 MG tablet TAKE 1 TABLET BY MOUTH ONCE DAILY BEFORE BREAKFAST DOSE  INCREASE 90 tablet 3   glucose blood (ONETOUCH VERIO) test strip Use as directed to check blood sugar twice daily. E08.9 100 each 6   lidocaine (LIDODERM) 5 % Place 1 patch onto the skin daily. Remove & Discard patch within 12 hours or as directed by MD 15 patch 0  lisinopril (ZESTRIL) 20 MG tablet Take 1 tablet (20 mg total) by mouth daily. 90 tablet 1   metFORMIN (GLUCOPHAGE) 500 MG tablet TAKE 2 TABLETS BY MOUTH AFTER BREAKFAST AND TAKE 1 TABLET BY MOUTH AFTER LUNCH AND 2 TABLETS AFTER DINNER 450 tablet 2   morphine (MSIR) 15 MG tablet Take 1 tablet (15 mg total) by mouth every 4 (four) hours as needed for severe pain. 10 tablet 0   OneTouch Delica Lancets 81M MISC Use as directed to check blood sugar twice daily. E08.9 100 each 6   No current facility-administered medications on file prior to visit.    Allergies  Allergen Reactions   Asa [Aspirin] Other (See Comments)    shakey    Social History   Socioeconomic History    Marital status: Single    Spouse name: Not on file   Number of children: Not on file   Years of education: Not on file   Highest education level: Not on file  Occupational History   Occupation: Caregiver    Employer: [employer]  Tobacco Use   Smoking status: Former    Types: Cigarettes    Quit date: 10/22/1983    Years since quitting: 38.3   Smokeless tobacco: Never  Vaping Use   Vaping Use: Never used  Substance and Sexual Activity   Alcohol use: No   Drug use: No   Sexual activity: Yes    Partners: Male  Other Topics Concern   Not on file  Social History Narrative   Personal care assistant   Married   Social Determinants of Health   Financial Resource Strain: Not on file  Food Insecurity: Not on file  Transportation Needs: Not on file  Physical Activity: Not on file  Stress: Not on file  Social Connections: Not on file  Intimate Partner Violence: Not on file    Family History  Problem Relation Age of Onset   Diabetes Father    Cancer - Other Father        FROM CHEMICAL EXPOSURE   Hypertension Mother    Colon cancer Maternal Grandmother 60   Breast cancer Maternal Aunt    Stomach cancer Neg Hx    Pancreatic cancer Neg Hx     Past Surgical History:  Procedure Laterality Date   BILATERAL SALPINGOOPHORECTOMY  11/05/2002   COLONOSCOPY  09/2013   LACERATION REPAIR Right 1957   ankle from lawnmower accident   [place]  11/05/2002   Dr. Matthew Saras, done for menorrhagia.  Benign pathology    ROS: Review of Systems Negative except as stated above  PHYSICAL EXAM: BP 104/68 (BP Location: Right Arm, Patient Position: Sitting, Cuff Size: Large)   Pulse (!) 109   Temp 98.1 F (36.7 C) (Oral)   Resp 16   Wt 219 lb (99.3 kg)   SpO2 97%   BMI 34.30 kg/m   Physical Exam  General appearance - alert, well appearing, older African-American female and in no distress Mental status - normal mood, behavior,  speech, dress, motor activity, and thought processes Chest - clear to auscultation, no wheezes, rales or rhonchi, symmetric air entry.  No significant splinting noted. Heart - normal rate, regular rhythm, normal S1, S2, no murmurs, rubs, clicks or gallops Neurological -decreased sensation to gross touch in the right lateral calf and the lateral aspect of the right foot.  Gait is normal.  Power in lower extremities 5/5 proximal and distal left lower extremity, 4+/5 proximal  and distal RLE      Latest Ref Rng & Units 08/18/2021    2:42 PM 01/28/2020    4:11 PM 09/04/2019    4:19 PM  CMP  Glucose 70 - 99 mg/dL 129  76  185   BUN 8 - 27 mg/dL '10  13  10   ' Creatinine 0.57 - 1.00 mg/dL 0.80  0.57  0.68   Sodium 134 - 144 mmol/L 139  140  140   Potassium 3.5 - 5.2 mmol/L 4.6  4.2  4.1   Chloride 96 - 106 mmol/L 102  101  101   CO2 20 - 29 mmol/L '23  25  27   ' Calcium 8.7 - 10.3 mg/dL 9.8  9.4  9.6   Total Protein 6.0 - 8.5 g/dL 7.3   7.2   Total Bilirubin 0.0 - 1.2 mg/dL 0.7   0.5   Alkaline Phos 44 - 121 IU/L 87   84   AST 0 - 40 IU/L 19   16   ALT 0 - 32 IU/L 21   29    Lipid Panel     Component Value Date/Time   CHOL 100 12/19/2021 0932   TRIG 61 12/19/2021 0932   HDL 43 12/19/2021 0932   CHOLHDL 2.3 12/19/2021 0932   CHOLHDL 2.9 10/31/2015 1245   VLDL 15 10/31/2015 1245   LDLCALC 43 12/19/2021 0932    CBC    Component Value Date/Time   WBC 5.5 08/18/2021 1442   WBC 5.3 10/31/2015 1245   RBC 4.97 08/18/2021 1442   RBC 4.85 10/31/2015 1245   HGB 13.1 08/18/2021 1442   HCT 40.6 08/18/2021 1442   PLT 378 08/18/2021 1442   MCV 82 08/18/2021 1442   MCH 26.4 (L) 08/18/2021 1442   MCH 24.1 (L) 10/31/2015 1245   MCHC 32.3 08/18/2021 1442   MCHC 31.3 (L) 10/31/2015 1245   RDW 14.2 08/18/2021 1442   LYMPHSABS 2.4 10/26/2013 1154   MONOABS 0.3 10/26/2013 1154   EOSABS 0.1 10/26/2013 1154   BASOSABS 0.0 10/26/2013 1154    ASSESSMENT AND PLAN:  1. Closed fracture of one rib of  left side, initial encounter Advised patient that it can take up to 6-8 weeks for fracture to heal.  Treat conservatively with pain management.  She reports she is out of hydrocodone and morphine were given through the emergency room. We will give a limited supply of tramadol to use as needed.  Advised that it is a controlled substance and can cause drowsiness and dependence. -Recommend turning on lights when she is about to walk down her halls or into dark rooms. [place] controlled substance reporting system reviewed. - traMADol (ULTRAM) 50 MG tablet; Take 1 tablet (50 mg total) by mouth 2 (two) times daily as needed.  Dispense: 40 tablet; Refill: 0  2. Lumbar radicular pain Advised against any lifting, pushing or pulling.  Use heating pad as needed.  Okay to take gabapentin twice a day.  We will give limited supply of tramadol to use as needed.  Recommend referral to a neurosurgeon.  Patient is agreeable to this. - Ambulatory referral to Neurosurgery - traMADol (ULTRAM) 50 MG tablet; Take 1 tablet (50 mg total) by mouth 2 (two) times daily as needed.  Dispense: 40 tablet; Refill: 0   Patient was given the opportunity to ask questions.  Patient verbalized understanding of the plan and was able to repeat key elements of the plan.   This documentation was completed  using Radio producer.  Any transcriptional errors are unintentional.  No orders of the defined types were placed in this encounter.    Requested Prescriptions    No prescriptions requested or ordered in this encounter    No follow-ups on file.  Karle Plumber, MD, FACP

## 2022-02-06 NOTE — Progress Notes (Signed)
Fall x several weeks ago sustaining left rib fracture  Rates pain 7/10  Takes Aleve   Wearing a waist band and using lidocaine patches  Referral to Neurology for right leg pain/ sciatica

## 2022-02-08 DIAGNOSIS — M79604 Pain in right leg: Secondary | ICD-10-CM | POA: Diagnosis not present

## 2022-02-08 DIAGNOSIS — R202 Paresthesia of skin: Secondary | ICD-10-CM | POA: Diagnosis not present

## 2022-02-08 DIAGNOSIS — R262 Difficulty in walking, not elsewhere classified: Secondary | ICD-10-CM | POA: Diagnosis not present

## 2022-02-08 DIAGNOSIS — M545 Low back pain, unspecified: Secondary | ICD-10-CM | POA: Diagnosis not present

## 2022-02-14 ENCOUNTER — Encounter: Payer: Self-pay | Admitting: Internal Medicine

## 2022-02-15 DIAGNOSIS — M79604 Pain in right leg: Secondary | ICD-10-CM | POA: Diagnosis not present

## 2022-02-15 DIAGNOSIS — R262 Difficulty in walking, not elsewhere classified: Secondary | ICD-10-CM | POA: Diagnosis not present

## 2022-02-15 DIAGNOSIS — R202 Paresthesia of skin: Secondary | ICD-10-CM | POA: Diagnosis not present

## 2022-02-15 DIAGNOSIS — M545 Low back pain, unspecified: Secondary | ICD-10-CM | POA: Diagnosis not present

## 2022-02-21 ENCOUNTER — Inpatient Hospital Stay: Payer: Medicare (Managed Care) | Admitting: Physician Assistant

## 2022-02-26 DIAGNOSIS — M5417 Radiculopathy, lumbosacral region: Secondary | ICD-10-CM | POA: Diagnosis not present

## 2022-02-26 DIAGNOSIS — M5126 Other intervertebral disc displacement, lumbar region: Secondary | ICD-10-CM | POA: Diagnosis not present

## 2022-02-26 DIAGNOSIS — Z6833 Body mass index (BMI) 33.0-33.9, adult: Secondary | ICD-10-CM | POA: Diagnosis not present

## 2022-03-01 DIAGNOSIS — R262 Difficulty in walking, not elsewhere classified: Secondary | ICD-10-CM | POA: Diagnosis not present

## 2022-03-01 DIAGNOSIS — M545 Low back pain, unspecified: Secondary | ICD-10-CM | POA: Diagnosis not present

## 2022-03-01 DIAGNOSIS — M79604 Pain in right leg: Secondary | ICD-10-CM | POA: Diagnosis not present

## 2022-03-01 DIAGNOSIS — R202 Paresthesia of skin: Secondary | ICD-10-CM | POA: Diagnosis not present

## 2022-03-08 DIAGNOSIS — R262 Difficulty in walking, not elsewhere classified: Secondary | ICD-10-CM | POA: Diagnosis not present

## 2022-03-08 DIAGNOSIS — M79604 Pain in right leg: Secondary | ICD-10-CM | POA: Diagnosis not present

## 2022-03-08 DIAGNOSIS — M545 Low back pain, unspecified: Secondary | ICD-10-CM | POA: Diagnosis not present

## 2022-03-08 DIAGNOSIS — R202 Paresthesia of skin: Secondary | ICD-10-CM | POA: Diagnosis not present

## 2022-03-12 ENCOUNTER — Other Ambulatory Visit: Payer: Self-pay | Admitting: Internal Medicine

## 2022-03-12 ENCOUNTER — Encounter: Payer: Self-pay | Admitting: Internal Medicine

## 2022-03-12 DIAGNOSIS — E119 Type 2 diabetes mellitus without complications: Secondary | ICD-10-CM

## 2022-03-12 MED ORDER — ONETOUCH VERIO VI STRP: ORAL_STRIP | 6 refills | Status: DC

## 2022-03-12 MED ORDER — ONETOUCH VERIO W/DEVICE KIT: PACK | 0 refills | Status: DC

## 2022-03-13 ENCOUNTER — Other Ambulatory Visit: Payer: Self-pay | Admitting: Internal Medicine

## 2022-03-13 DIAGNOSIS — E119 Type 2 diabetes mellitus without complications: Secondary | ICD-10-CM

## 2022-03-14 ENCOUNTER — Telehealth: Payer: Self-pay

## 2022-03-14 NOTE — Telephone Encounter (Signed)
I received a PA request for the One Touch Verio products-Accu Chek Guide products are preferred.  If appropriate, please resend script for the Accu-Chek Guide

## 2022-03-15 DIAGNOSIS — M545 Low back pain, unspecified: Secondary | ICD-10-CM | POA: Diagnosis not present

## 2022-03-15 DIAGNOSIS — M79604 Pain in right leg: Secondary | ICD-10-CM | POA: Diagnosis not present

## 2022-03-15 DIAGNOSIS — R262 Difficulty in walking, not elsewhere classified: Secondary | ICD-10-CM | POA: Diagnosis not present

## 2022-03-15 DIAGNOSIS — R202 Paresthesia of skin: Secondary | ICD-10-CM | POA: Diagnosis not present

## 2022-03-20 ENCOUNTER — Other Ambulatory Visit: Payer: Self-pay | Admitting: Internal Medicine

## 2022-03-20 ENCOUNTER — Encounter: Payer: Self-pay | Admitting: Internal Medicine

## 2022-03-20 DIAGNOSIS — M5416 Radiculopathy, lumbar region: Secondary | ICD-10-CM | POA: Diagnosis not present

## 2022-03-20 MED ORDER — ACCU-CHEK GUIDE W/DEVICE KIT: PACK | 0 refills | Status: DC

## 2022-03-20 MED ORDER — ACCU-CHEK GUIDE VI STRP: ORAL_STRIP | 12 refills | Status: DC

## 2022-03-20 MED ORDER — ACCU-CHEK SOFTCLIX LANCETS MISC: 12 refills | Status: DC

## 2022-03-27 ENCOUNTER — Other Ambulatory Visit: Payer: Self-pay | Admitting: Internal Medicine

## 2022-03-27 DIAGNOSIS — Z1231 Encounter for screening mammogram for malignant neoplasm of breast: Secondary | ICD-10-CM

## 2022-03-28 ENCOUNTER — Ambulatory Visit
Admission: RE | Admit: 2022-03-28 | Discharge: 2022-03-28 | Disposition: A | Discharge status: Home / Self Care | Payer: Medicare PPO | Source: Ambulatory Visit | Attending: Internal Medicine | Admitting: Internal Medicine

## 2022-03-28 DIAGNOSIS — Z1231 Encounter for screening mammogram for malignant neoplasm of breast: Secondary | ICD-10-CM | POA: Diagnosis not present

## 2022-04-20 ENCOUNTER — Ambulatory Visit: Payer: Medicare PPO | Attending: Internal Medicine | Admitting: Internal Medicine

## 2022-04-20 ENCOUNTER — Encounter: Payer: Self-pay | Admitting: Internal Medicine

## 2022-04-20 VITALS — BP 131/71 | HR 86 | Ht 67.0 in | Wt 217.0 lb

## 2022-04-20 DIAGNOSIS — E1169 Type 2 diabetes mellitus with other specified complication: Secondary | ICD-10-CM

## 2022-04-20 DIAGNOSIS — M5416 Radiculopathy, lumbar region: Secondary | ICD-10-CM | POA: Diagnosis not present

## 2022-04-20 DIAGNOSIS — M5417 Radiculopathy, lumbosacral region: Secondary | ICD-10-CM | POA: Diagnosis not present

## 2022-04-20 DIAGNOSIS — E669 Obesity, unspecified: Secondary | ICD-10-CM

## 2022-04-20 DIAGNOSIS — E1159 Type 2 diabetes mellitus with other circulatory complications: Secondary | ICD-10-CM

## 2022-04-20 DIAGNOSIS — I152 Hypertension secondary to endocrine disorders: Secondary | ICD-10-CM

## 2022-04-20 DIAGNOSIS — Z23 Encounter for immunization: Secondary | ICD-10-CM

## 2022-04-20 DIAGNOSIS — E871 Hypo-osmolality and hyponatremia: Secondary | ICD-10-CM | POA: Diagnosis not present

## 2022-04-20 DIAGNOSIS — Z6833 Body mass index (BMI) 33.0-33.9, adult: Secondary | ICD-10-CM

## 2022-04-20 DIAGNOSIS — E785 Hyperlipidemia, unspecified: Secondary | ICD-10-CM

## 2022-04-20 DIAGNOSIS — E538 Deficiency of other specified B group vitamins: Secondary | ICD-10-CM

## 2022-04-20 LAB — POCT GLYCOSYLATED HEMOGLOBIN (HGB A1C): HbA1c, POC (controlled diabetic range): 8.5 % — AB (ref 0.0–7.0)

## 2022-04-20 LAB — GLUCOSE, POCT (MANUAL RESULT ENTRY): POC Glucose: 150 mg/dl — AB (ref 70–99)

## 2022-04-20 MED ORDER — GLIMEPIRIDE 4 MG PO TABS: ORAL_TABLET | ORAL | 1 refills | Status: DC

## 2022-04-20 NOTE — Patient Instructions (Signed)
Increase glimepiride to 4 mg daily.  Continue to monitor your blood sugars.  If blood sugars before meals start dropping below 80, cut back to 2 mg daily.  Remember to get your COVID-19 booster shot at your local pharmacy.

## 2022-04-20 NOTE — Progress Notes (Signed)
Patient ID: Rhonda Miles, female    DOB: 30-Jul-1950  MRN: [id number]  CC: chronic ds management   Subjective: Rhonda Miles is a 71 y.o. female who presents for chronic ds management Her concerns today include:  Patient with history of DM type II, HL, IDA, obesity, HTN.   DM/obesity: Results for orders placed or performed in visit on 04/20/22  POCT glucose (manual entry)  Result Value Ref Range   POC Glucose 150 (A) 70 - 99 mg/dl  POCT glycosylated hemoglobin (Hb A1C)  Result Value Ref Range   Hemoglobin A1C     HbA1c POC (<> result, manual entry)     HbA1c, POC (prediabetic range)     HbA1c, POC (controlled diabetic range) 8.5 (A) 0.0 - 7.0 %  A1C 8.5 Wgh down 10 lbs since 12/2021.  "I'm really watching what I eat."  Snack on fruits during the day. Not eating as much bread.  Doing more meal prep at home.  Not walking as much because she still has some numbness RT leg from calf to toes.  However, no radiating pain in leg anymore.  Saw neurosurgeon at [facility].  Had ESI 1 mth ago.  Has f/u telephone appt today.  Completed P.T -checking BS 2x/day before meals.  BS increased into 160-170s for 3 wks post ESI to back.  Now BS in the 120s.  Compliant with meds: Amaryl 2 mg daily abd Metformin 1gram/500 mg/1 gram  HTN:  compliant with Lisinopril 20 mg and Norvasc 10 mg daily Notice increase swelling in feet when she sits for long time.  BP at home 120s/69-70s Limits salt  HL:  compliant with and tolerating Lipitor 40 mg daily.   B12 def:  taking 1000 mcg OTC daily.   I note that her sodium level was slightly low at 132 on chemistry that was done in July of this year.  Patient Active Problem List   Diagnosis Date Noted   Lumbar radicular pain 02/06/2022   Vitamin B 12 deficiency 12/20/2021   Iron deficiency anemia 08/28/2019   Influenza vaccine needed 04/03/2019   Hyperlipidemia associated with type 2 diabetes mellitus ([place]) 04/03/2019   Essential  hypertension 06/07/2017   Chronic posterior anal fissure 03/26/2017   Prolapsed internal hemorrhoids, grade 2 03/26/2017   Glaucoma (increased eye pressure) 07/12/2015   Snoring 07/12/2015   Bothersome menopausal vasomotor symptoms 06/03/2014   Diabetes mellitus due to underlying condition without complications ([place]) 41/96/2229   Obesity 02/13/2013     Current Outpatient Medications on File Prior to Visit  Medication Sig Dispense Refill   Accu-Chek Softclix Lancets lancets Use as instructed 100 each 12   amLODipine (NORVASC) 10 MG tablet Take 1 tablet (10 mg total) by mouth daily. 90 tablet 2   atorvastatin (LIPITOR) 40 MG tablet Take 1 tablet (40 mg total) by mouth daily. 90 tablet 2   Blood Glucose Monitoring Suppl (ACCU-CHEK GUIDE) w/Device KIT UAD 1 kit 0   Blood Pressure Monitor DEVI Use as directed to check home blood pressure 2-3 times a week 1 Device 0   cholecalciferol (VITAMIN D) 1000 UNITS tablet Take 1,000 Units by mouth daily.     Ferrous Sulfate (IRON) 325 (65 Fe) MG TABS Take 1 tablet by mouth twice a week 90 tablet 0   gabapentin (NEURONTIN) 300 MG capsule Take 1 capsule (300 mg total) by mouth at bedtime. 30 capsule 3   glucose blood (ACCU-CHEK GUIDE) test strip Use as instructed 100 each 12  lidocaine (LIDODERM) 5 % Place 1 patch onto the skin daily. Remove & Discard patch within 12 hours or as directed by MD 15 patch 0   lisinopril (ZESTRIL) 20 MG tablet Take 1 tablet (20 mg total) by mouth daily. 90 tablet 1   metFORMIN (GLUCOPHAGE) 500 MG tablet TAKE 2 TABLETS BY MOUTH AFTER BREAKFAST AND TAKE 1 TABLET BY MOUTH AFTER LUNCH AND 2 TABLETS AFTER DINNER 450 tablet 2   traMADol (ULTRAM) 50 MG tablet Take 1 tablet (50 mg total) by mouth 2 (two) times daily as needed. 40 tablet 0   cyclobenzaprine (FLEXERIL) 10 MG tablet Take 1 tablet (10 mg total) by mouth 2 (two) times daily as needed for muscle spasms. 20 tablet 0   No current facility-administered medications on file  prior to visit.    Allergies  Allergen Reactions   Asa [Aspirin] Other (See Comments)    shakey    Social History   Socioeconomic History   Marital status: Single    Spouse name: Not on file   Number of children: Not on file   Years of education: Not on file   Highest education level: Not on file  Occupational History   Occupation: Caregiver    Employer: [employer]  Tobacco Use   Smoking status: Former    Types: Cigarettes    Quit date: 10/22/1983    Years since quitting: 38.5   Smokeless tobacco: Never  Vaping Use   Vaping Use: Never used  Substance and Sexual Activity   Alcohol use: No   Drug use: No   Sexual activity: Yes    Partners: Male  Other Topics Concern   Not on file  Social History Narrative   Personal care assistant   Married   Social Determinants of Health   Financial Resource Strain: Not on file  Food Insecurity: Not on file  Transportation Needs: Not on file  Physical Activity: Not on file  Stress: Not on file  Social Connections: Not on file  Intimate Partner Violence: Not on file    Family History  Problem Relation Age of Onset   Diabetes Father    Cancer - Other Father        FROM CHEMICAL EXPOSURE   Hypertension Mother    Colon cancer Maternal Grandmother 60   Breast cancer Maternal Aunt    Stomach cancer Neg Hx    Pancreatic cancer Neg Hx     Past Surgical History:  Procedure Laterality Date   BILATERAL SALPINGOOPHORECTOMY  11/05/2002   COLONOSCOPY  09/2013   LACERATION REPAIR Right 1957   ankle from lawnmower accident   [place]  11/05/2002   Dr. Matthew Saras, done for menorrhagia.  Benign pathology    ROS: Review of Systems Negative except as stated above  PHYSICAL EXAM: BP 131/71   Pulse 86   Ht 5' 7" (1.702 m)   Wt 217 lb (98.4 kg)   SpO2 99%   BMI 33.99 kg/m   Wt Readings from Last 3 Encounters:  04/20/22 217 lb (98.4 kg)  02/06/22 219 lb (99.3 kg)   01/09/22 227 lb (103 kg)    Physical Exam  General appearance - alert, well appearing, older African-American female and in no distress Mental status - normal mood, behavior, speech, dress, motor activity, and thought processes Neck - supple, no significant adenopathy Chest - clear to auscultation, no wheezes, rales or rhonchi, symmetric air entry Heart - normal rate, regular rhythm, normal S1,  S2, no murmurs, rubs, clicks or gallops Extremities - peripheral pulses normal, no clubbing or cyanosis.  Slight pedal edema. Diabetic Foot Exam - Simple   Simple Foot Form Diabetic Foot exam was performed with the following findings: Yes 04/20/2022  1:18 PM  Visual Inspection No deformities, no ulcerations, no other skin breakdown bilaterally: Yes Sensation Testing See comments: Yes Pulse Check Posterior Tibialis and Dorsalis pulse intact bilaterally: Yes Comments Decrease sensation on deep exam on the plantar surface of the right foot.         Latest Ref Rng & Units 08/18/2021    2:42 PM 01/28/2020    4:11 PM 09/04/2019    4:19 PM  CMP  Glucose 70 - 99 mg/dL 129  76  185   BUN 8 - 27 mg/dL 10  13  10   Creatinine 0.57 - 1.00 mg/dL 0.80  0.57  0.68   Sodium 134 - 144 mmol/L 139  140  140   Potassium 3.5 - 5.2 mmol/L 4.6  4.2  4.1   Chloride 96 - 106 mmol/L 102  101  101   CO2 20 - 29 mmol/L 23  25  27   Calcium 8.7 - 10.3 mg/dL 9.8  9.4  9.6   Total Protein 6.0 - 8.5 g/dL 7.3   7.2   Total Bilirubin 0.0 - 1.2 mg/dL 0.7   0.5   Alkaline Phos 44 - 121 IU/L 87   84   AST 0 - 40 IU/L 19   16   ALT 0 - 32 IU/L 21   29    Lipid Panel     Component Value Date/Time   CHOL 100 12/19/2021 0932   TRIG 61 12/19/2021 0932   HDL 43 12/19/2021 0932   CHOLHDL 2.3 12/19/2021 0932   CHOLHDL 2.9 10/31/2015 1245   VLDL 15 10/31/2015 1245   LDLCALC 43 12/19/2021 0932    CBC    Component Value Date/Time   WBC 5.5 08/18/2021 1442   WBC 5.3 10/31/2015 1245   RBC 4.97 08/18/2021 1442   RBC  4.85 10/31/2015 1245   HGB 13.1 08/18/2021 1442   HCT 40.6 08/18/2021 1442   PLT 378 08/18/2021 1442   MCV 82 08/18/2021 1442   MCH 26.4 (L) 08/18/2021 1442   MCH 24.1 (L) 10/31/2015 1245   MCHC 32.3 08/18/2021 1442   MCHC 31.3 (L) 10/31/2015 1245   RDW 14.2 08/18/2021 1442   LYMPHSABS 2.4 10/26/2013 1154   MONOABS 0.3 10/26/2013 1154   EOSABS 0.1 10/26/2013 1154   BASOSABS 0.0 10/26/2013 1154    ASSESSMENT AND PLAN: 1. Type 2 diabetes mellitus with obesity (HCC) Not at goal.  Blood sugars increased for several weeks post ESI. Continue metformin at current dose. Increase Amaryl to 4 mg daily. Advised that she continues to check blood sugars with goal being 90-130 before meals. - POCT glucose (manual entry) - POCT glycosylated hemoglobin (Hb A1C) - Microalbumin / creatinine urine ratio - glimepiride (AMARYL) 4 MG tablet; TAKE 1 TABLET BY MOUTH ONCE DAILY BEFORE BREAKFAST DOSE  INCREASE  Dispense: 90 tablet; Refill: 1  2. Hypertension associated with diabetes (HCC) Close to goal.  Continue current dose of lisinopril and amlodipine. - Basic Metabolic Panel  3. Hyperlipidemia associated with type 2 diabetes mellitus (HCC) Continue atorvastatin 40 mg daily.  4. Vitamin B12 deficiency Continue vitamin B12 supplement 1000 mcg daily over-the-counter.  Recheck levels today. - Vitamin B12  5. Lumbar radicular syndrome It is good that   back pain has abated.  However she is still having some residual numbness in the lower leg and foot.  She will keep upcoming appointment with neurosurgeon.  6. Hyponatremia - Basic Metabolic Panel  7. Need for immunization against influenza - Flu Vaccine QUAD 33moIM (Fluarix, Fluzone & Alfiuria Quad PF)     Patient was given the opportunity to ask questions.  Patient verbalized understanding of the plan and was able to repeat key elements of the plan.   This documentation was completed using DRadio producer  Any  transcriptional errors are unintentional.  Orders Placed This Encounter  Procedures   Flu Vaccine QUAD 664moM (Fluarix, Fluzone & Alfiuria Quad PF)   Basic Metabolic Panel   Vitamin B1D63 Microalbumin / creatinine urine ratio   POCT glucose (manual entry)   POCT glycosylated hemoglobin (Hb A1C)     Requested Prescriptions   Signed Prescriptions Disp Refills   glimepiride (AMARYL) 4 MG tablet 90 tablet 1    Sig: TAKE 1 TABLET BY MOUTH ONCE DAILY BEFORE BREAKFAST DOSE  INCREASE    Return in about 4 months (around 08/20/2022).  DeKarle PlumberMD, FACP

## 2022-04-21 LAB — BASIC METABOLIC PANEL
BUN/Creatinine Ratio: 20 (ref 12–28)
BUN: 13 mg/dL (ref 8–27)
CO2: 23 mmol/L (ref 20–29)
Calcium: 9.7 mg/dL (ref 8.7–10.3)
Chloride: 103 mmol/L (ref 96–106)
Creatinine, Ser: 0.64 mg/dL (ref 0.57–1.00)
Glucose: 134 mg/dL — ABNORMAL HIGH (ref 70–99)
Potassium: 4.3 mmol/L (ref 3.5–5.2)
Sodium: 142 mmol/L (ref 134–144)
eGFR: 96 mL/min/{1.73_m2} (ref >59)

## 2022-04-21 LAB — VITAMIN B12: Vitamin B-12: 853 pg/mL (ref 232–1245)

## 2022-04-21 LAB — MICROALBUMIN / CREATININE URINE RATIO
Creatinine, Urine: 97.2 mg/dL
Microalb/Creat Ratio: 8 mg/g creat (ref 0–29)
Microalbumin, Urine: 7.6 ug/mL

## 2022-04-25 ENCOUNTER — Encounter: Payer: Self-pay | Admitting: Internal Medicine

## 2022-04-25 ENCOUNTER — Other Ambulatory Visit: Payer: Self-pay | Admitting: Internal Medicine

## 2022-04-25 DIAGNOSIS — E1169 Type 2 diabetes mellitus with other specified complication: Secondary | ICD-10-CM

## 2022-04-25 MED ORDER — METFORMIN HCL 500 MG PO TABS: ORAL_TABLET | ORAL | 3 refills | Status: DC

## 2022-05-20 ENCOUNTER — Other Ambulatory Visit: Payer: Self-pay | Admitting: Internal Medicine

## 2022-05-20 ENCOUNTER — Encounter: Payer: Self-pay | Admitting: Internal Medicine

## 2022-05-20 DIAGNOSIS — G6289 Other specified polyneuropathies: Secondary | ICD-10-CM

## 2022-05-21 ENCOUNTER — Other Ambulatory Visit: Payer: Self-pay | Admitting: Internal Medicine

## 2022-05-21 DIAGNOSIS — G6289 Other specified polyneuropathies: Secondary | ICD-10-CM

## 2022-05-21 DIAGNOSIS — D509 Iron deficiency anemia, unspecified: Secondary | ICD-10-CM

## 2022-05-21 MED ORDER — IRON 325 (65 FE) MG PO TABS: ORAL_TABLET | ORAL | 0 refills | Status: DC

## 2022-05-21 MED ORDER — GABAPENTIN 300 MG PO CAPS: 300.0000 mg | ORAL_CAPSULE | Freq: Every day | ORAL | 3 refills | Status: DC

## 2022-05-21 NOTE — Telephone Encounter (Signed)
Unable to refill per protocol, last refill by provider 05/21/22. Will refuse duplicate request.  Requested Prescriptions  Pending Prescriptions Disp Refills  . gabapentin (NEURONTIN) 300 MG capsule [Pharmacy Med Name: GABAPENTIN 300 MG CAPSULE] 30 capsule 3    Sig: TAKE 1 CAPSULE BY MOUTH EVERYDAY AT BEDTIME     Neurology: Anticonvulsants - gabapentin Passed - 05/20/2022  8:09 PM      Passed - Cr in normal range and within 360 days    Creat  Date Value Ref Range Status  10/31/2015 0.52 0.50 - 0.99 mg/dL Final   Creatinine, Ser  Date Value Ref Range Status  04/20/2022 0.64 0.57 - 1.00 mg/dL Final   Creatinine, Urine  Date Value Ref Range Status  05/29/2016 144 20 - 320 mg/dL Final         Passed - Completed PHQ-2 or PHQ-9 in the last 360 days      Passed - Valid encounter within last 12 months    Recent Outpatient Visits          1 month ago Type 2 diabetes mellitus with obesity ([place])   [place] Karle Plumber B, MD   3 months ago Closed fracture of one rib of left side, initial encounter   [place], Deborah B, MD   5 months ago Type 2 diabetes mellitus with obesity [facility])   [place] Karle Plumber B, MD   9 months ago Type 2 diabetes mellitus with obesity [facility])   [place], Deborah B, MD   1 year ago Type 2 diabetes mellitus with obesity [facility])   [place], MD      Future Appointments            In 3 months Wynetta Emery, Dalbert Batman, MD [place]

## 2022-05-24 ENCOUNTER — Encounter: Payer: Self-pay | Admitting: Internal Medicine

## 2022-05-24 ENCOUNTER — Other Ambulatory Visit: Payer: Self-pay | Admitting: Internal Medicine

## 2022-05-24 DIAGNOSIS — I10 Essential (primary) hypertension: Secondary | ICD-10-CM

## 2022-05-24 MED ORDER — AMLODIPINE BESYLATE 10 MG PO TABS: 10.0000 mg | ORAL_TABLET | Freq: Every day | ORAL | 2 refills | Status: DC

## 2022-07-16 ENCOUNTER — Other Ambulatory Visit: Payer: Self-pay | Admitting: Internal Medicine

## 2022-07-16 DIAGNOSIS — E1169 Type 2 diabetes mellitus with other specified complication: Secondary | ICD-10-CM

## 2022-07-17 ENCOUNTER — Other Ambulatory Visit: Payer: Self-pay | Admitting: Internal Medicine

## 2022-07-17 DIAGNOSIS — I10 Essential (primary) hypertension: Secondary | ICD-10-CM

## 2022-08-14 ENCOUNTER — Other Ambulatory Visit: Payer: Self-pay | Admitting: Internal Medicine

## 2022-08-14 DIAGNOSIS — I10 Essential (primary) hypertension: Secondary | ICD-10-CM

## 2022-08-14 NOTE — Telephone Encounter (Signed)
Requested Prescriptions  Pending Prescriptions Disp Refills   lisinopril (ZESTRIL) 20 MG tablet [Pharmacy Med Name: LISINOPRIL 20 MG TABLET] 90 tablet 0    Sig: TAKE 1 TABLET BY MOUTH EVERY DAY     Cardiovascular:  ACE Inhibitors Passed - 08/14/2022  2:32 PM      Passed - Cr in normal range and within 180 days    Creat  Date Value Ref Range Status  10/31/2015 0.52 0.50 - 0.99 mg/dL Final   Creatinine, Ser  Date Value Ref Range Status  04/20/2022 0.64 0.57 - 1.00 mg/dL Final   Creatinine, Urine  Date Value Ref Range Status  05/29/2016 144 20 - 320 mg/dL Final         Passed - K in normal range and within 180 days    Potassium  Date Value Ref Range Status  04/20/2022 4.3 3.5 - 5.2 mmol/L Final         Passed - Patient is not pregnant      Passed - Last BP in normal range    BP Readings from Last 1 Encounters:  04/20/22 131/71         Passed - Valid encounter within last 6 months    Recent Outpatient Visits           3 months ago Type 2 diabetes mellitus with obesity ([place])   [place] Karle Plumber B, MD   6 months ago Closed fracture of one rib of left side, initial encounter   [place], MD   7 months ago Type 2 diabetes mellitus with obesity [facility])   [place] Karle Plumber B, MD   1 year ago Type 2 diabetes mellitus with obesity [facility])   [place] Karle Plumber B, MD   1 year ago Type 2 diabetes mellitus with obesity [facility])   [place], MD       Future Appointments             In 2 months Wynetta Emery, Dalbert Batman, MD [place]

## 2022-08-20 ENCOUNTER — Ambulatory Visit: Payer: Medicare PPO | Admitting: Internal Medicine

## 2022-08-22 DIAGNOSIS — H401131 Primary open-angle glaucoma, bilateral, mild stage: Secondary | ICD-10-CM | POA: Diagnosis not present

## 2022-08-22 DIAGNOSIS — E119 Type 2 diabetes mellitus without complications: Secondary | ICD-10-CM | POA: Diagnosis not present

## 2022-08-22 DIAGNOSIS — H25813 Combined forms of age-related cataract, bilateral: Secondary | ICD-10-CM | POA: Diagnosis not present

## 2022-09-14 ENCOUNTER — Encounter: Payer: Self-pay | Admitting: Internal Medicine

## 2022-09-14 ENCOUNTER — Other Ambulatory Visit: Payer: Self-pay | Admitting: Internal Medicine

## 2022-09-14 DIAGNOSIS — G6289 Other specified polyneuropathies: Secondary | ICD-10-CM

## 2022-09-14 DIAGNOSIS — I10 Essential (primary) hypertension: Secondary | ICD-10-CM

## 2022-09-14 MED ORDER — GABAPENTIN 300 MG PO CAPS: ORAL_CAPSULE | ORAL | 2 refills | Status: DC

## 2022-09-14 NOTE — Telephone Encounter (Signed)
Patient requesting Rx to different pharmacy Lisinopril- rx 08/14/22 #90- will send remainder to new pharmacy Requested Prescriptions  Pending Prescriptions Disp Refills   gabapentin (NEURONTIN) 300 MG capsule [Pharmacy Med Name: GABAPENTIN 300 MG CAPSULE] 30 capsule 2    Sig: TAKE 1 CAPSULE BY MOUTH EVERYDAY AT BEDTIME     Neurology: Anticonvulsants - gabapentin Passed - 09/14/2022 10:09 AM      Passed - Cr in normal range and within 360 days    Creat  Date Value Ref Range Status  10/31/2015 0.52 0.50 - 0.99 mg/dL Final   Creatinine, Ser  Date Value Ref Range Status  04/20/2022 0.64 0.57 - 1.00 mg/dL Final   Creatinine, Urine  Date Value Ref Range Status  05/29/2016 144 20 - 320 mg/dL Final         Passed - Completed PHQ-2 or PHQ-9 in the last 360 days      Passed - Valid encounter within last 12 months    Recent Outpatient Visits           4 months ago Type 2 diabetes mellitus with obesity ([place])   [place] Karle Plumber B, MD   7 months ago Closed fracture of one rib of left side, initial encounter   [place] Karle Plumber B, MD   8 months ago Type 2 diabetes mellitus with obesity [facility])   [place] Karle Plumber B, MD   1 year ago Type 2 diabetes mellitus with obesity [facility])   [place] Karle Plumber B, MD   1 year ago Type 2 diabetes mellitus with obesity [facility])   [place], MD       Future Appointments             In 1 month Lad[place] Pier, MD [place]             lisinopril (ZESTRIL) 20 MG tablet [Pharmacy Med Name: LISINOPRIL 20 MG TABLET] 90 tablet 0    Sig: TAKE 1 TABLET BY MOUTH EVERY DAY     Cardiovascular:  ACE Inhibitors Passed - 09/14/2022 10:09 AM      Passed - Cr in normal range and within 180  days    Creat  Date Value Ref Range Status  10/31/2015 0.52 0.50 - 0.99 mg/dL Final   Creatinine, Ser  Date Value Ref Range Status  04/20/2022 0.64 0.57 - 1.00 mg/dL Final   Creatinine, Urine  Date Value Ref Range Status  05/29/2016 144 20 - 320 mg/dL Final         Passed - K in normal range and within 180 days    Potassium  Date Value Ref Range Status  04/20/2022 4.3 3.5 - 5.2 mmol/L Final         Passed - Patient is not pregnant      Passed - Last BP in normal range    BP Readings from Last 1 Encounters:  04/20/22 131/71         Passed - Valid encounter within last 6 months    Recent Outpatient Visits           4 months ago Type 2 diabetes mellitus with obesity [facility])   [place] Karle Plumber B, MD   7 months ago Closed fracture of  one rib of left side, initial encounter   [place], MD   8 months ago Type 2 diabetes mellitus with obesity [facility])   [place] Ladell Pier, MD   1 year ago Type 2 diabetes mellitus with obesity [facility])   [place] Ladell Pier, MD   1 year ago Type 2 diabetes mellitus with obesity [facility])   [place], MD       Future Appointments             In 1 month Wynetta Emery, Dalbert Batman, MD [place]

## 2022-09-27 ENCOUNTER — Ambulatory Visit: Payer: Medicare PPO | Attending: Family Medicine | Admitting: Internal Medicine

## 2022-09-27 ENCOUNTER — Encounter: Payer: Self-pay | Admitting: Internal Medicine

## 2022-09-27 VITALS — BP 121/72 | HR 81 | Temp 98.2°F | Ht 67.0 in | Wt 221.0 lb

## 2022-09-27 DIAGNOSIS — E1169 Type 2 diabetes mellitus with other specified complication: Secondary | ICD-10-CM | POA: Diagnosis not present

## 2022-09-27 DIAGNOSIS — R200 Anesthesia of skin: Secondary | ICD-10-CM | POA: Diagnosis not present

## 2022-09-27 DIAGNOSIS — D509 Iron deficiency anemia, unspecified: Secondary | ICD-10-CM

## 2022-09-27 DIAGNOSIS — E785 Hyperlipidemia, unspecified: Secondary | ICD-10-CM | POA: Diagnosis not present

## 2022-09-27 DIAGNOSIS — E669 Obesity, unspecified: Secondary | ICD-10-CM

## 2022-09-27 DIAGNOSIS — I10 Essential (primary) hypertension: Secondary | ICD-10-CM

## 2022-09-27 LAB — POCT GLYCOSYLATED HEMOGLOBIN (HGB A1C): HbA1c, POC (controlled diabetic range): 6.5 % (ref 0.0–7.0)

## 2022-09-27 LAB — GLUCOSE, POCT (MANUAL RESULT ENTRY): POC Glucose: 126 mg/dl — AB (ref 70–99)

## 2022-09-27 MED ORDER — LISINOPRIL 20 MG PO TABS: 20.0000 mg | ORAL_TABLET | Freq: Every day | ORAL | 1 refills | Status: DC

## 2022-09-27 MED ORDER — GLIMEPIRIDE 4 MG PO TABS: ORAL_TABLET | ORAL | 1 refills | Status: DC

## 2022-09-27 MED ORDER — IRON 325 (65 FE) MG PO TABS: ORAL_TABLET | ORAL | 0 refills | Status: DC

## 2022-09-27 MED ORDER — ATORVASTATIN CALCIUM 40 MG PO TABS: 40.0000 mg | ORAL_TABLET | Freq: Every day | ORAL | 1 refills | Status: DC

## 2022-09-27 NOTE — Progress Notes (Signed)
Patient ID: Rhonda Miles, female    DOB: 1950-10-27  MRN: [id number]  CC: Diabetes (DM f/u. Shann Medal still experiencing numbness on toes / feet/Already received flu vax.)   Subjective: Rhonda Miles is a 72 y.o. female who presents for chronic ds management Her concerns today include:  Patient with history of DM type II, HL, IDA, obesity, HTN.    DM: Results for orders placed or performed in visit on 09/27/22  POCT glucose (manual entry)  Result Value Ref Range   POC Glucose 126 (A) 70 - 99 mg/dl  POCT glycosylated hemoglobin (Hb A1C)  Result Value Ref Range   Hemoglobin A1C     HbA1c POC (<> result, manual entry)     HbA1c, POC (prediabetic range)     HbA1c, POC (controlled diabetic range) 6.5 0.0 - 7.0 %   Compliant with meds which are metformin 1000 mg in the morning, 500 mg at noon and 1000 mg in the evening and Amaryl 4 mg daily. Checks BS every morning 70-90s.  Occasional lows in the 60s.She keeps a snack with her at all times Doing good with eating habits Very little exercise due to weather  HTN: Compliant with taking amlodipine 10 mg daily and lisinopril 20 mg daily. Checks BP intermittently.  Range has been good.  She limits salt in the foods.  No chest pains or lower extremity swelling.  HL:  taking and tolerating Lipitoir  Reports that she still has some residual numbness in the right lower leg mainly in the calf into the lateral toes.  No further pain in the lower back.  Reports that she had touch base with the neurosurgeon and he recommended another ESI but patient declined stating that she could live with the residual numbness.  History of iron deficiency anemia: Request refill on iron supplement.  She is due for repeat CBC. Patient Active Problem List   Diagnosis Date Noted   Lumbar radicular pain 02/06/2022   Vitamin B 12 deficiency 12/20/2021   Iron deficiency anemia 08/28/2019   Influenza vaccine needed 04/03/2019   Hyperlipidemia associated with  type 2 diabetes mellitus ([place]) 04/03/2019   Essential hypertension 06/07/2017   Chronic posterior anal fissure 03/26/2017   Prolapsed internal hemorrhoids, grade 2 03/26/2017   Glaucoma (increased eye pressure) 07/12/2015   Snoring 07/12/2015   Bothersome menopausal vasomotor symptoms 06/03/2014   Diabetes mellitus due to underlying condition without complications ([place]) [id number]   Obesity 02/13/2013     Current Outpatient Medications on File Prior to Visit  Medication Sig Dispense Refill   Accu-Chek Softclix Lancets lancets Use as instructed 100 each 12   amLODipine (NORVASC) 10 MG tablet Take 1 tablet (10 mg total) by mouth daily. 90 tablet 2   Blood Glucose Monitoring Suppl (ACCU-CHEK GUIDE) w/Device KIT UAD 1 kit 0   Blood Pressure Monitor DEVI Use as directed to check home blood pressure 2-3 times a week 1 Device 0   gabapentin (NEURONTIN) 300 MG capsule TAKE 1 CAPSULE BY MOUTH EVERYDAY AT BEDTIME 90 capsule 2   glucose blood (ACCU-CHEK GUIDE) test strip Use as instructed 100 each 12   metFORMIN (GLUCOPHAGE) 500 MG tablet TAKE 2 TABLETS BY MOUTH AFTER BREAKFAST AND TAKE 1 TABLET BY MOUTH AFTER LUNCH AND 2 TABLETS AFTER DINNER 450 tablet 3   cholecalciferol (VITAMIN D) 1000 UNITS tablet Take 1,000 Units by mouth daily.     cyclobenzaprine (FLEXERIL) 10 MG tablet Take 1 tablet (10 mg total) by mouth 2 (two)  times daily as needed for muscle spasms. 20 tablet 0   lidocaine (LIDODERM) 5 % Place 1 patch onto the skin daily. Remove & Discard patch within 12 hours or as directed by MD (Patient not taking: Reported on 09/27/2022) 15 patch 0   traMADol (ULTRAM) 50 MG tablet Take 1 tablet (50 mg total) by mouth 2 (two) times daily as needed. 40 tablet 0   No current facility-administered medications on file prior to visit.    Allergies  Allergen Reactions   Asa [Aspirin] Other (See Comments)    shakey    Social History   Socioeconomic History   Marital status: Single    Spouse name:  Not on file   Number of children: Not on file   Years of education: Not on file   Highest education level: Not on file  Occupational History   Occupation: Caregiver    Employer: [employer]  Tobacco Use   Smoking status: Former    Types: Cigarettes    Quit date: 10/22/1983    Years since quitting: 38.9   Smokeless tobacco: Never  Vaping Use   Vaping Use: Never used  Substance and Sexual Activity   Alcohol use: No   Drug use: No   Sexual activity: Yes    Partners: Male  Other Topics Concern   Not on file  Social History Narrative   Personal care assistant   Married   Social Determinants of Health   Financial Resource Strain: Not on file  Food Insecurity: Not on file  Transportation Needs: Not on file  Physical Activity: Not on file  Stress: Not on file  Social Connections: Not on file  Intimate Partner Violence: Not on file    Family History  Problem Relation Age of Onset   Diabetes Father    Cancer - Other Father        FROM CHEMICAL EXPOSURE   Hypertension Mother    Colon cancer Maternal Grandmother 60   Breast cancer Maternal Aunt    Stomach cancer Neg Hx    Pancreatic cancer Neg Hx     Past Surgical History:  Procedure Laterality Date   BILATERAL SALPINGOOPHORECTOMY  11/05/2002   COLONOSCOPY  09/2013   LACERATION REPAIR Right 1957   ankle from lawnmower accident   [place]  11/05/2002   Dr. Matthew Saras, done for menorrhagia.  Benign pathology    ROS: Review of Systems Negative except as stated above  PHYSICAL EXAM: BP 121/72 (BP Location: Left Arm, Patient Position: Sitting, Cuff Size: Normal)   Pulse 81   Temp 98.2 F (36.8 C) (Oral)   Ht '5\' 7"'$  (1.702 m)   Wt 221 lb (100.2 kg)   SpO2 98%   BMI 34.61 kg/m   Wt Readings from Last 3 Encounters:  09/27/22 221 lb (100.2 kg)  04/20/22 217 lb (98.4 kg)  02/06/22 219 lb (99.3 kg)    Physical Exam  General appearance - alert, well appearing,  older African-American female and in no distress Mental status - normal mood, behavior, speech, dress, motor activity, and thought processes Neck - supple, no significant adenopathy Chest - clear to auscultation, no wheezes, rales or rhonchi, symmetric air entry Heart - normal rate, regular rhythm, normal S1, S2, no murmurs, rubs, clicks or gallops Extremities - peripheral pulses normal, no pedal edema, no clubbing or cyanosis      Latest Ref Rng & Units 04/20/2022    9:21 AM 08/18/2021    2:42  PM 01/28/2020    4:11 PM  CMP  Glucose 70 - 99 mg/dL 134  129  76   BUN 8 - 27 mg/dL '13  10  13   '$ Creatinine 0.57 - 1.00 mg/dL 0.64  0.80  0.57   Sodium 134 - 144 mmol/L 142  139  140   Potassium 3.5 - 5.2 mmol/L 4.3  4.6  4.2   Chloride 96 - 106 mmol/L 103  102  101   CO2 20 - 29 mmol/L '23  23  25   '$ Calcium 8.7 - 10.3 mg/dL 9.7  9.8  9.4   Total Protein 6.0 - 8.5 g/dL  7.3    Total Bilirubin 0.0 - 1.2 mg/dL  0.7    Alkaline Phos 44 - 121 IU/L  87    AST 0 - 40 IU/L  19    ALT 0 - 32 IU/L  21     Lipid Panel     Component Value Date/Time   CHOL 100 12/19/2021 0932   TRIG 61 12/19/2021 0932   HDL 43 12/19/2021 0932   CHOLHDL 2.3 12/19/2021 0932   CHOLHDL 2.9 10/31/2015 1245   VLDL 15 10/31/2015 1245   LDLCALC 43 12/19/2021 0932    CBC    Component Value Date/Time   WBC 5.5 08/18/2021 1442   WBC 5.3 10/31/2015 1245   RBC 4.97 08/18/2021 1442   RBC 4.85 10/31/2015 1245   HGB 13.1 08/18/2021 1442   HCT 40.6 08/18/2021 1442   PLT 378 08/18/2021 1442   MCV 82 08/18/2021 1442   MCH 26.4 (L) 08/18/2021 1442   MCH 24.1 (L) 10/31/2015 1245   MCHC 32.3 08/18/2021 1442   MCHC 31.3 (L) 10/31/2015 1245   RDW 14.2 08/18/2021 1442   LYMPHSABS 2.4 10/26/2013 1154   MONOABS 0.3 10/26/2013 1154   EOSABS 0.1 10/26/2013 1154   BASOSABS 0.0 10/26/2013 1154    ASSESSMENT AND PLAN:  1. Type 2 diabetes mellitus with obesity (HCC) At goal.  Continue metformin.  We discussed how often she  gets blood sugars in the mornings in the 70s.  Patient states about once a week.  She can tell when blood sugar is low.  Advised that if blood sugar in the 70s start occurring more often, she should cut the Amaryl pill in half. - POCT glucose (manual entry) - POCT glycosylated hemoglobin (Hb A1C) - glimepiride (AMARYL) 4 MG tablet; TAKE 1 TABLET BY MOUTH ONCE DAILY BEFORE BREAKFAST DOSE  INCREASE  Dispense: 90 tablet; Refill: 1  2. Hyperlipidemia associated with type 2 diabetes mellitus (HCC) Continue atorvastatin - atorvastatin (LIPITOR) 40 MG tablet; Take 1 tablet (40 mg total) by mouth daily.  Dispense: 90 tablet; Refill: 1  3. Iron deficiency anemia, unspecified iron deficiency anemia type - Ferrous Sulfate (IRON) 325 (65 Fe) MG TABS; Take 1 tablet by mouth twice a week  Dispense: 90 tablet; Refill: 0 - CBC; Future  4. Essential hypertension Controlled.  Continue Norvasc 10 mg daily and lisinopril 20 mg daily - lisinopril (ZESTRIL) 20 MG tablet; Take 1 tablet (20 mg total) by mouth daily.  Dispense: 90 tablet; Refill: 1  5. Numbness in right leg Stable.  Continue gabapentin    Patient was given the opportunity to ask questions.  Patient verbalized understanding of the plan and was able to repeat key elements of the plan.   This documentation was completed using Radio producer.  Any transcriptional errors are unintentional.  Orders Placed This Encounter  Procedures   CBC   POCT glucose (manual entry)   POCT glycosylated hemoglobin (Hb A1C)     Requested Prescriptions   Signed Prescriptions Disp Refills   atorvastatin (LIPITOR) 40 MG tablet 90 tablet 1    Sig: Take 1 tablet (40 mg total) by mouth daily.   Ferrous Sulfate (IRON) 325 (65 Fe) MG TABS 90 tablet 0    Sig: Take 1 tablet by mouth twice a week   glimepiride (AMARYL) 4 MG tablet 90 tablet 1    Sig: TAKE 1 TABLET BY MOUTH ONCE DAILY BEFORE BREAKFAST DOSE  INCREASE   lisinopril (ZESTRIL) 20 MG  tablet 90 tablet 1    Sig: Take 1 tablet (20 mg total) by mouth daily.    Return in about 4 months (around 01/27/2023) for Give appt with CMA for Medicare Wellness Visit after 11/11/2022.  Karle Plumber, MD, FACP

## 2022-10-01 ENCOUNTER — Ambulatory Visit: Payer: Medicare PPO | Attending: Family Medicine

## 2022-10-01 DIAGNOSIS — D509 Iron deficiency anemia, unspecified: Secondary | ICD-10-CM | POA: Diagnosis not present

## 2022-10-02 LAB — CBC
Hematocrit: 36.2 % (ref 34.0–46.6)
Hemoglobin: 11.7 g/dL (ref 11.1–15.9)
MCH: 26.5 pg — ABNORMAL LOW (ref 26.6–33.0)
MCHC: 32.3 g/dL (ref 31.5–35.7)
MCV: 82 fL (ref 79–97)
Platelets: 309 10*3/uL (ref 150–450)
RBC: 4.42 x10E6/uL (ref 3.77–5.28)
RDW: 14.7 % (ref 11.7–15.4)
WBC: 5.7 10*3/uL (ref 3.4–10.8)

## 2022-10-04 DIAGNOSIS — H2511 Age-related nuclear cataract, right eye: Secondary | ICD-10-CM | POA: Diagnosis not present

## 2022-10-04 DIAGNOSIS — H401111 Primary open-angle glaucoma, right eye, mild stage: Secondary | ICD-10-CM | POA: Diagnosis not present

## 2022-10-12 DIAGNOSIS — H2512 Age-related nuclear cataract, left eye: Secondary | ICD-10-CM | POA: Diagnosis not present

## 2022-10-17 ENCOUNTER — Other Ambulatory Visit: Payer: Self-pay | Admitting: Internal Medicine

## 2022-10-17 DIAGNOSIS — E1169 Type 2 diabetes mellitus with other specified complication: Secondary | ICD-10-CM

## 2022-10-18 DIAGNOSIS — H401121 Primary open-angle glaucoma, left eye, mild stage: Secondary | ICD-10-CM | POA: Diagnosis not present

## 2022-10-18 DIAGNOSIS — H2512 Age-related nuclear cataract, left eye: Secondary | ICD-10-CM | POA: Diagnosis not present

## 2022-10-22 ENCOUNTER — Ambulatory Visit: Payer: Medicare PPO | Admitting: Internal Medicine

## 2022-11-05 ENCOUNTER — Encounter: Payer: Self-pay | Admitting: Internal Medicine

## 2022-11-08 ENCOUNTER — Telehealth: Payer: Self-pay | Admitting: Internal Medicine

## 2022-11-08 NOTE — Telephone Encounter (Signed)
Called patient to schedule Medicare Annual Wellness Visit (AWV). Left message for patient to call back and schedule Medicare Annual Wellness Visit (AWV).  Please see if patient can do her AWV on November 09, 2022 between 10:00-10:15  If any questions, please contact me at [phone].  Thank you,  Judeth Cornfield,  AMB Clinical Support [facility] AWV Program Direct Dial ??[phone]

## 2022-11-09 ENCOUNTER — Ambulatory Visit: Payer: Medicare HMO | Attending: Nurse Practitioner

## 2022-11-09 VITALS — Ht 67.0 in | Wt 224.0 lb

## 2022-11-09 DIAGNOSIS — Z0000 Encounter for general adult medical examination without abnormal findings: Secondary | ICD-10-CM | POA: Diagnosis not present

## 2022-11-09 NOTE — Progress Notes (Signed)
Subjective:   Rhonda Miles is a 72 y.o. female who presents for Medicare Annual (Subsequent) preventive examination. I connected with  Caleen Jobs on 11/09/22 by a audio enabled telemedicine application and verified that I am speaking with the correct person using two identifiers.  Patient Location: Home  Provider Location: Home Office  I discussed the limitations of evaluation and management by telemedicine. The patient expressed understanding and agreed to proceed.  Review of Systems     Cardiac Risk Factors include: advanced age (>32men, >63 women);hypertension;dyslipidemia;diabetes mellitus     Objective:    Today's Vitals   11/09/22 0953  Weight: 224 lb (101.6 kg)  Height:  (1.702 m)   Body mass index is 35.08 kg/m.     11/09/2022    9:58 AM 01/09/2022    1:56 PM 01/07/2022    8:49 PM 11/10/2021   10:34 AM 03/03/2020    4:07 PM 01/03/2017    1:41 PM 05/29/2016   11:21 AM  Advanced Directives  Does Patient Have a Medical Advance Directive? No No  No No No No  Would patient like information on creating a medical advance directive? No - Patient declined   No - Patient declined Yes (ED - Information included in AVS)  No - patient declined information     Information is confidential and restricted. Go to Review Flowsheets to unlock data.    Current Medications (verified) Outpatient Encounter Medications as of 11/09/2022  Medication Sig   Accu-Chek Softclix Lancets lancets Use as instructed   amLODipine (NORVASC) 10 MG tablet Take 1 tablet (10 mg total) by mouth daily.   atorvastatin (LIPITOR) 40 MG tablet TAKE 1 TABLET BY MOUTH EVERY DAY   Blood Glucose Monitoring Suppl (ACCU-CHEK GUIDE) w/Device KIT UAD   Blood Pressure Monitor DEVI Use as directed to check home blood pressure 2-3 times a week   Ferrous Sulfate (IRON) 325 (65 Fe) MG TABS Take 1 tablet by mouth twice a week   gabapentin (NEURONTIN) 300 MG capsule TAKE 1 CAPSULE BY MOUTH EVERYDAY AT  BEDTIME   glimepiride (AMARYL) 4 MG tablet TAKE 1 TABLET BY MOUTH ONCE DAILY BEFORE BREAKFAST (DOSE INCREASE)   glucose blood (ACCU-CHEK GUIDE) test strip Use as instructed   lidocaine (LIDODERM) 5 % Place 1 patch onto the skin daily. Remove & Discard patch within 12 hours or as directed by MD   lisinopril (ZESTRIL) 20 MG tablet Take 1 tablet (20 mg total) by mouth daily.   metFORMIN (GLUCOPHAGE) 500 MG tablet TAKE 2 TABLETS BY MOUTH AFTER BREAKFAST AND TAKE 1 TABLET BY MOUTH AFTER LUNCH AND 2 TABLETS AFTER DINNER   cholecalciferol (VITAMIN D) 1000 UNITS tablet Take 1,000 Units by mouth daily.   cyclobenzaprine (FLEXERIL) 10 MG tablet Take 1 tablet (10 mg total) by mouth 2 (two) times daily as needed for muscle spasms.   traMADol (ULTRAM) 50 MG tablet Take 1 tablet (50 mg total) by mouth 2 (two) times daily as needed.   No facility-administered encounter medications on file as of 11/09/2022.    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 Marcelle Overlie, done for menorrhagia.  Benign pathology   Family History  Problem Relation Age of Onset   Diabetes Father    Cancer - Other Father        FROM CHEMICAL EXPOSURE   Hypertension Mother    Colon cancer Maternal Grandmother 64   Breast cancer Maternal Aunt    Stomach cancer Neg Hx    Pancreatic cancer Neg Hx    Social History   Socioeconomic History    Marital status: Single    Spouse name: Not on file   Number of children: Not on file   Years of education: Not on file   Highest education level: Not on file  Occupational History   Occupation: Caregiver    Employer: [employer]  Tobacco Use   Smoking status: Former    Types: Cigarettes    Quit date: 10/22/1983    Years since quitting: 39.0   Smokeless tobacco: Never  Vaping Use   Vaping Use: Never used  Substance and Sexual Activity   Alcohol use: No   Drug use: No   Sexual activity: Yes    Partners: Male  Other Topics Concern   Not on file  Social History Narrative   Personal care assistant   Married   Social Determinants of Health   Financial Resource Strain: Low Risk  (11/09/2022)   Overall Financial Resource Strain (CARDIA)    Difficulty of Paying Living Expenses: Not hard at all  Food Insecurity: No Food Insecurity (11/09/2022)   Hunger Vital Sign    Worried About Running Out of Food in the Last Year: Never true    Ran Out of Food in the Last Year: Never true  Transportation Needs: No Transportation Needs (11/09/2022)   PRAPARE - Administrator, Civil Service (Medical): No    Lack of Transportation (Non-Medical): No  Physical Activity: Sufficiently Active (11/09/2022)   Exercise Vital Sign    Days of Exercise per Week: 5 days    Minutes of Exercise per Session: 30 min  Stress: No Stress Concern Present (11/09/2022)   [employer] of Occupational Health - Occupational Stress Questionnaire    Feeling of Stress : Not at all  Social Connections: Moderately Isolated (11/09/2022)   Social Connection and Isolation Panel [NHANES]    Frequency of Communication with Friends and Family: More than three times a week    Frequency of Social Gatherings with Friends and Family: More than three times a week    Attends Religious Services: More than 4 times per year    Active Member of [employer] or Organizations: No    Attends Hospital doctor: Never    Marital Status: Divorced    Tobacco Counseling Counseling given: Not Answered   Clinical Intake:  Pre-visit preparation completed: Yes  Pain : No/denies pain     Nutritional Risks: None Diabetes: Yes CBG done?: No Did pt. bring in CBG monitor from home?: No  How often do you need to have someone help you when you read instructions, pamphlets, or other written materials from your doctor or pharmacy?: 1 - Never  Diabetic?yes Nutrition Risk Assessment:  Has the patient had any N/V/D within the last 2 months?  No  Does the patient have any non-healing  wounds?  No  Has the patient had any unintentional weight loss or weight gain?  No   Diabetes:  Is the patient diabetic?  Yes  If diabetic, was a CBG obtained today?  No  Did the patient bring in their glucometer from home?  No  How often do you monitor your CBG's? 2 x day .   Financial Strains and Diabetes Management:  Are you having any financial strains with the device, your supplies or your medication? No .  Does the patient want to be seen by Chronic Care Management for management of their diabetes?  No  Would the patient like to be referred to a Nutritionist or for Diabetic Management?  No   Diabetic Exams:  Diabetic Eye Exam: Completed 09/2022 Diabetic Foot Exam: Overdue, Pt has been advised about the importance in completing this exam. Pt is scheduled for diabetic foot exam on next office visit .   Interpreter Needed?: No  Information entered by :: Renie Ora, LPN   Activities of Daily Living    11/09/2022    9:58 AM 11/10/2021   10:35 AM  In your present state of health, do you have any difficulty performing the following activities:  Hearing? 0 0  Vision? 0 0  Difficulty concentrating or making decisions? 0 0  Walking or climbing stairs? 0 0  Dressing or bathing? 0 0  Doing errands, shopping? 0 0  Preparing Food and eating ? N N  Using the Toilet? N N  In the past six months,  have you accidently leaked urine? N N  Do you have problems with loss of bowel control? N N  Managing your Medications? N N  Managing your Finances? N N  Housekeeping or managing your Housekeeping? N N    Patient Care Team: Claiborne Rigg, NP as PCP - General (Nurse Practitioner)  Indicate any recent Medical Services you may have received from other than [facility] providers in the past year (date may be approximate).     Assessment:   This is a routine wellness examination for Rhonda Miles.  Hearing/Vision screen Vision Screening - Comments:: Wears rx glasses - up to date with routine eye exams with  Dr.Groat   Dietary issues and exercise activities discussed: Current Exercise Habits: Home exercise routine, Type of exercise: walking, Time (Minutes): 30, Frequency (Times/Week): 5, Weekly Exercise (Minutes/Week): 150, Intensity: Mild, Exercise limited by: None identified   Goals Addressed             This Visit's Progress    Exercise 4x per week (30 min per time)   On track      Depression Screen    11/09/2022    9:57 AM 09/27/2022   11:27 AM 04/20/2022    8:42 AM 02/06/2022   11:14 AM 12/19/2021    8:55 AM 11/10/2021   10:35 AM 04/14/2021    4:21 PM  PHQ 2/9 Scores  PHQ - 2 Score 0 0 0 0 0 0 0  PHQ- 9 Score 0 0 0 3 0      Fall Risk    11/09/2022    9:55 AM 09/27/2022   11:20 AM 04/20/2022    8:42 AM 12/19/2021    8:46 AM 11/10/2021   10:34 AM  Fall Risk   Falls in the past year? 0 0 0 0 0  Number falls in past yr: 0 0 0 0 0  Injury with Fall? 0 0 0 0 0  Risk for fall due to :  No Fall Risks No Fall Risks No Fall Risks    Follow up Falls prevention discussed    Falls evaluation completed    FALL RISK PREVENTION PERTAINING TO THE HOME:  Any stairs in or around the home? No  If so, are there any without handrails? No  Home free of loose throw rugs in walkways, pet beds, electrical cords, etc? Yes  Adequate lighting in your home to reduce risk of falls? Yes   ASSISTIVE  DEVICES UTILIZED TO PREVENT FALLS:  Life alert? No  Use of a cane, walker or w/c? No  Grab bars in the bathroom? No  Shower chair or bench in shower? No  Elevated toilet seat or a handicapped toilet? No       11/10/2021   10:37 AM 03/03/2020    4:11 PM  MMSE - Mini Mental State Exam  Orientation to time 5 5  Orientation to Place 5 5  Registration 3 3  Attention/ Calculation 5 5  Recall 3 2  Language- name 2 objects 2 2  Language- repeat 1 1  Language- follow 3 step command 3 3  Language- read & follow direction 1 1  Write a sentence 1 1  Copy design 1 1  Total score 30 29        11/09/2022    9:58 AM  6CIT Screen  What Year? 0 points  What month? 0 points  What time? 0 points  Count back from 20 0 points  Months in reverse 0 points  Repeat phrase 0 points  Total Score 0 points    Immunizations Immunization History  Administered Date(s) Administered   Influenza,inj,Quad PF,6+ Mos 04/27/2014, 05/10/2015, 05/29/2016, 06/07/2017, 03/20/2018, 04/07/2019, 04/05/2020, 04/14/2021, 04/20/2022   PFIZER(Purple Top)SARS-COV-2 Vaccination 08/06/2019, 08/27/2019, 05/05/2020   Pneumococcal Conjugate-13 06/26/2018   Pneumococcal Polysaccharide-23 04/27/2014, 03/07/2020   Tdap 03/29/2015   Zoster Recombinat (Shingrix) 03/07/2020, 05/09/2020    TDAP status: Up to date  Flu Vaccine status: Up to date  Pneumococcal vaccine status: Up to date  Covid-19 vaccine status: Completed vaccines  Qualifies for Shingles Vaccine? Yes   Zostavax completed Yes   Shingrix Completed?: Yes  Screening Tests Health Maintenance  Topic Date Due   COVID-19 Vaccine (4 - 2023-24 season) 03/23/2022   OPHTHALMOLOGY EXAM  08/16/2022   INFLUENZA VACCINE  02/21/2023   MAMMOGRAM  03/29/2023   HEMOGLOBIN A1C  03/30/2023   Diabetic kidney evaluation - eGFR measurement  04/21/2023   Diabetic kidney evaluation - Urine ACR  04/21/2023   FOOT EXAM  04/21/2023   COLONOSCOPY (Pts 45-81yrs Insurance  coverage will need to be confirmed)  10/08/2023   Medicare Annual Wellness (AWV)  11/09/2023   DTaP/Tdap/Td (2 - Td or Tdap) 03/28/2025   Pneumonia Vaccine 31+ Years old  Completed   DEXA SCAN  Completed   Hepatitis C Screening  Completed   Zoster Vaccines- Shingrix  Completed   HPV VACCINES  Aged Out    Health Maintenance  Health Maintenance Due  Topic Date Due   COVID-19 Vaccine (4 - 2023-24 season) 03/23/2022   OPHTHALMOLOGY EXAM  08/16/2022    Colorectal cancer screening: Type of screening: Colonoscopy. Completed 10/07/2013. Repeat every 10 years  Mammogram status: Completed 03/28/2022. Repeat every year  Bone Density status: Completed 06/14/2020. Results reflect: Bone density results: NORMAL. Repeat every 5 years.  Lung Cancer Screening: (Low Dose CT Chest recommended if Age 65-80 years, 30 pack-year currently smoking OR have quit w/in 15years.) does not qualify.   Lung  Cancer Screening Referral: n/a  Additional Screening:  Hepatitis C Screening: does not qualify; Completed 10/31/2015  Vision Screening: Recommended annual ophthalmology exams for early detection of glaucoma and other disorders of the eye. Is the patient up to date with their annual eye exam?  Yes  Who is the provider or what is the name of the office in which the patient attends annual eye exams? Dr.Groat  If pt is not established with a provider, would they like to be referred to a provider to establish care? No .   Dental Screening: Recommended annual dental exams for proper oral hygiene  Community Resource Referral / Chronic Care Management: CRR required this visit?  No   CCM required this visit?  No      Plan:     I have personally reviewed and noted the following in the patient's chart:   Medical and social history Use of alcohol, tobacco or illicit drugs  Current medications and supplements including opioid prescriptions. Patient is not currently taking opioid prescriptions. Functional  ability and status Nutritional status Physical activity Advanced directives List of other physicians Hospitalizations, surgeries, and ER v/isits in previous 12 months Vitals Screenings to include cognitive, depression, and falls Referrals and appointments  In addition, I have reviewed and discussed with patient certain preventive protocols, quality metrics, and best practice recommendations. A written personalized care plan for preventive services as well as general preventive health recommendations were provided to patient.     Lorrene Reid, LPN   10/29/8117   Nurse Notes: Patient states BS have been running 95-120 range and she is exercising daily

## 2022-11-09 NOTE — Patient Instructions (Signed)
Rhonda Miles , Thank you for taking time to come for your Medicare Wellness Visit. I appreciate your ongoing commitment to your health goals. Please review the following plan we discussed and let me know if I can assist you in the future.   These are the goals we discussed:  Goals      Exercise 4x per week (30 min per time)     HEMOGLOBIN A1C < 7.0     Weight (lb) < 200 lb (90.7 kg)        This is a list of the screening recommended for you and due dates:  Health Maintenance  Topic Date Due   COVID-19 Vaccine (4 - 2023-24 season) 03/23/2022   Eye exam for diabetics  08/16/2022   Flu Shot  02/21/2023   Mammogram  03/29/2023   Hemoglobin A1C  03/30/2023   Yearly kidney function blood test for diabetes  04/21/2023   Yearly kidney health urinalysis for diabetes  04/21/2023   Complete foot exam   04/21/2023   Colon Cancer Screening  10/08/2023   Medicare Annual Wellness Visit  11/09/2023   DTaP/Tdap/Td vaccine (2 - Td or Tdap) 03/28/2025   Pneumonia Vaccine  Completed   DEXA scan (bone density measurement)  Completed   Hepatitis C Screening: USPSTF Recommendation to screen - Ages 26-79 yo.  Completed   Zoster (Shingles) Vaccine  Completed   HPV Vaccine  Aged Out    Advanced directives: Advance directive discussed with you today. I have provided a copy for you to complete at home and have notarized. Once this is complete please bring a copy in to our office so we can scan it into your chart.   Conditions/risks identified: Aim for 30 minutes of exercise or brisk walking, 6-8 glasses of water, and 5 servings of fruits and vegetables each day.   Next appointment: Follow up in one year for your annual wellness visit    Preventive Care 65 Years and Older, Female Preventive care refers to lifestyle choices and visits with your health care provider that can promote health and wellness. What does preventive care include? A yearly physical exam. This is also called an annual well  check. Dental exams once or twice a year. Routine eye exams. Ask your health care provider how often you should have your eyes checked. Personal lifestyle choices, including: Daily care of your teeth and gums. Regular physical activity. Eating a healthy diet. Avoiding tobacco and drug use. Limiting alcohol use. Practicing safe sex. Taking low-dose aspirin every day. Taking vitamin and mineral supplements as recommended by your health care provider. What happens during an annual well check? The services and screenings done by your health care provider during your annual well check will depend on your age, overall health, lifestyle risk factors, and family history of disease. Counseling  Your health care provider may ask you questions about your: Alcohol use. Tobacco use. Drug use. Emotional well-being. Home and relationship well-being. Sexual activity. Eating habits. History of falls. Memory and ability to understand (cognition). Work and work Astronomer. Reproductive health. Screening  You may have the following tests or measurements: Height, weight, and BMI. Blood pressure. Lipid and cholesterol levels. These may be checked every 5 years, or more frequently if you are over 55 years old. Skin check. Lung cancer screening. You may have this screening every year starting at age 67 if you have a 30-pack-year history of smoking and currently smoke or have quit within the past 15 years. Fecal occult blood  test (FOBT) of the stool. You may have this test every year starting at age 38. Flexible sigmoidoscopy or colonoscopy. You may have a sigmoidoscopy every 5 years or a colonoscopy every 10 years starting at age 33. Hepatitis C blood test. Hepatitis B blood test. Sexually transmitted disease (STD) testing. Diabetes screening. This is done by checking your blood sugar (glucose) after you have not eaten for a while (fasting). You may have this done every 1-3 years. Bone density scan.  This is done to screen for osteoporosis. You may have this done starting at age 12. Mammogram. This may be done every 1-2 years. Talk to your health care provider about how often you should have regular mammograms. Talk with your health care provider about your test results, treatment options, and if necessary, the need for more tests. Vaccines  Your health care provider may recommend certain vaccines, such as: Influenza vaccine. This is recommended every year. Tetanus, diphtheria, and acellular pertussis (Tdap, Td) vaccine. You may need a Td booster every 10 years. Zoster vaccine. You may need this after age 38. Pneumococcal 13-valent conjugate (PCV13) vaccine. One dose is recommended after age 90. Pneumococcal polysaccharide (PPSV23) vaccine. One dose is recommended after age 64. Talk to your health care provider about which screenings and vaccines you need and how often you need them. This information is not intended to replace advice given to you by your health care provider. Make sure you discuss any questions you have with your health care provider. Document Released: 08/05/2015 Document Revised: 03/28/2016 Document Reviewed: 05/10/2015 Elsevier Interactive Patient Education  2017 [employer] Prevention in the Home Falls can cause injuries. They can happen to people of all ages. There are many things you can do to make your home safe and to help prevent falls. What can I do on the outside of my home? Regularly fix the edges of walkways and driveways and fix any cracks. Remove anything that might make you trip as you walk through a door, such as a raised step or threshold. Trim any bushes or trees on the path to your home. Use bright outdoor lighting. Clear any walking paths of anything that might make someone trip, such as rocks or tools. Regularly check to see if handrails are loose or broken. Make sure that both sides of any steps have handrails. Any raised decks and porches  should have guardrails on the edges. Have any leaves, snow, or ice cleared regularly. Use sand or salt on walking paths during winter. Clean up any spills in your garage right away. This includes oil or grease spills. What can I do in the bathroom? Use night lights. Install grab bars by the toilet and in the tub and shower. Do not use towel bars as grab bars. Use non-skid mats or decals in the tub or shower. If you need to sit down in the shower, use a plastic, non-slip stool. Keep the floor dry. Clean up any water that spills on the floor as soon as it happens. Remove soap buildup in the tub or shower regularly. Attach bath mats securely with double-sided non-slip rug tape. Do not have throw rugs and other things on the floor that can make you trip. What can I do in the bedroom? Use night lights. Make sure that you have a light by your bed that is easy to reach. Do not use any sheets or blankets that are too big for your bed. They should not hang down onto the floor. Have  a firm chair that has side arms. You can use this for support while you get dressed. Do not have throw rugs and other things on the floor that can make you trip. What can I do in the kitchen? Clean up any spills right away. Avoid walking on wet floors. Keep items that you use a lot in easy-to-reach places. If you need to reach something above you, use a strong step stool that has a grab bar. Keep electrical cords out of the way. Do not use floor polish or wax that makes floors slippery. If you must use wax, use non-skid floor wax. Do not have throw rugs and other things on the floor that can make you trip. What can I do with my stairs? Do not leave any items on the stairs. Make sure that there are handrails on both sides of the stairs and use them. Fix handrails that are broken or loose. Make sure that handrails are as long as the stairways. Check any carpeting to make sure that it is firmly attached to the stairs.  Fix any carpet that is loose or worn. Avoid having throw rugs at the top or bottom of the stairs. If you do have throw rugs, attach them to the floor with carpet tape. Make sure that you have a light switch at the top of the stairs and the bottom of the stairs. If you do not have them, ask someone to add them for you. What else can I do to help prevent falls? Wear shoes that: Do not have high heels. Have rubber bottoms. Are comfortable and fit you well. Are closed at the toe. Do not wear sandals. If you use a stepladder: Make sure that it is fully opened. Do not climb a closed stepladder. Make sure that both sides of the stepladder are locked into place. Ask someone to hold it for you, if possible. Clearly mark and make sure that you can see: Any grab bars or handrails. First and last steps. Where the edge of each step is. Use tools that help you move around (mobility aids) if they are needed. These include: Canes. Walkers. Scooters. Crutches. Turn on the lights when you go into a dark area. Replace any light bulbs as soon as they burn out. Set up your furniture so you have a clear path. Avoid moving your furniture around. If any of your floors are uneven, fix them. If there are any pets around you, be aware of where they are. Review your medicines with your doctor. Some medicines can make you feel dizzy. This can increase your chance of falling. Ask your doctor what other things that you can do to help prevent falls. This information is not intended to replace advice given to you by your health care provider. Make sure you discuss any questions you have with your health care provider. Document Released: 05/05/2009 Document Revised: 12/15/2015 Document Reviewed: 08/13/2014 Elsevier Interactive Patient Education  2017 [employer].

## 2022-11-28 ENCOUNTER — Encounter: Payer: Self-pay | Admitting: Internal Medicine

## 2022-11-29 ENCOUNTER — Other Ambulatory Visit: Payer: Self-pay | Admitting: Internal Medicine

## 2022-11-29 DIAGNOSIS — D509 Iron deficiency anemia, unspecified: Secondary | ICD-10-CM

## 2022-11-29 MED ORDER — IRON 325 (65 FE) MG PO TABS: ORAL_TABLET | ORAL | 1 refills | Status: AC

## 2023-01-04 DIAGNOSIS — W010XXA Fall on same level from slipping, tripping and stumbling without subsequent striking against object, initial encounter: Secondary | ICD-10-CM | POA: Diagnosis not present

## 2023-01-04 DIAGNOSIS — S99922A Unspecified injury of left foot, initial encounter: Secondary | ICD-10-CM | POA: Diagnosis not present

## 2023-01-04 DIAGNOSIS — M7989 Other specified soft tissue disorders: Secondary | ICD-10-CM | POA: Diagnosis not present

## 2023-01-04 DIAGNOSIS — M25572 Pain in left ankle and joints of left foot: Secondary | ICD-10-CM | POA: Diagnosis not present

## 2023-01-04 DIAGNOSIS — W108XXA Fall (on) (from) other stairs and steps, initial encounter: Secondary | ICD-10-CM | POA: Diagnosis not present

## 2023-01-07 DIAGNOSIS — S93492A Sprain of other ligament of left ankle, initial encounter: Secondary | ICD-10-CM | POA: Diagnosis not present

## 2023-01-23 ENCOUNTER — Other Ambulatory Visit: Payer: Self-pay | Admitting: Internal Medicine

## 2023-01-23 DIAGNOSIS — E119 Type 2 diabetes mellitus without complications: Secondary | ICD-10-CM

## 2023-01-23 DIAGNOSIS — E669 Obesity, unspecified: Secondary | ICD-10-CM

## 2023-01-23 DIAGNOSIS — I10 Essential (primary) hypertension: Secondary | ICD-10-CM

## 2023-01-28 ENCOUNTER — Ambulatory Visit: Payer: Medicare PPO | Admitting: Internal Medicine

## 2023-01-29 ENCOUNTER — Encounter: Payer: Self-pay | Admitting: Internal Medicine

## 2023-01-29 ENCOUNTER — Ambulatory Visit: Payer: Medicare PPO | Attending: Internal Medicine | Admitting: Internal Medicine

## 2023-01-29 VITALS — BP 105/66 | HR 78 | Temp 98.3°F | Ht 67.0 in | Wt 224.0 lb

## 2023-01-29 DIAGNOSIS — S93422A Sprain of deltoid ligament of left ankle, initial encounter: Secondary | ICD-10-CM | POA: Diagnosis not present

## 2023-01-29 DIAGNOSIS — Z7984 Long term (current) use of oral hypoglycemic drugs: Secondary | ICD-10-CM

## 2023-01-29 DIAGNOSIS — E669 Obesity, unspecified: Secondary | ICD-10-CM

## 2023-01-29 DIAGNOSIS — E1169 Type 2 diabetes mellitus with other specified complication: Secondary | ICD-10-CM | POA: Diagnosis not present

## 2023-01-29 DIAGNOSIS — Z9181 History of falling: Secondary | ICD-10-CM | POA: Diagnosis not present

## 2023-01-29 DIAGNOSIS — E1159 Type 2 diabetes mellitus with other circulatory complications: Secondary | ICD-10-CM | POA: Diagnosis not present

## 2023-01-29 DIAGNOSIS — E119 Type 2 diabetes mellitus without complications: Secondary | ICD-10-CM | POA: Diagnosis not present

## 2023-01-29 DIAGNOSIS — E785 Hyperlipidemia, unspecified: Secondary | ICD-10-CM

## 2023-01-29 DIAGNOSIS — I152 Hypertension secondary to endocrine disorders: Secondary | ICD-10-CM

## 2023-01-29 DIAGNOSIS — D509 Iron deficiency anemia, unspecified: Secondary | ICD-10-CM

## 2023-01-29 LAB — POCT GLYCOSYLATED HEMOGLOBIN (HGB A1C): HbA1c, POC (controlled diabetic range): 7.4 % — AB (ref 0.0–7.0)

## 2023-01-29 LAB — GLUCOSE, POCT (MANUAL RESULT ENTRY): POC Glucose: 104 mg/dl — AB (ref 70–99)

## 2023-01-29 MED ORDER — GLIMEPIRIDE 4 MG PO TABS: ORAL_TABLET | ORAL | 1 refills | Status: DC

## 2023-01-29 MED ORDER — METFORMIN HCL 500 MG PO TABS: ORAL_TABLET | ORAL | 1 refills | Status: DC

## 2023-01-29 MED ORDER — AMLODIPINE BESYLATE 10 MG PO TABS: 10.0000 mg | ORAL_TABLET | Freq: Every day | ORAL | 1 refills | Status: DC

## 2023-01-29 NOTE — Progress Notes (Signed)
Patient ID: Rhonda Miles, female    DOB: [birth year]  MRN: [id number]  CC: Diabetes (DM & HTN f/u. Med refills./Pt had a fall on 01/04/23 & now experiencing pain on L flank)   Subjective: Rhonda Miles is a 72 y.o. female who presents for chronic ds Her concerns today include:  Patient with history of DM type II, HL, IDA, obesity, HTN.   HTN: Compliant with Norvasc and lisinopril.  Limit salt in the foods. Taking atorvastatin as prescribed.  DM Results for orders placed or performed in visit on 01/29/23  POCT glucose (manual entry)  Result Value Ref Range   POC Glucose 104 (A) 70 - 99 mg/dl  POCT glycosylated hemoglobin (Hb A1C)  Result Value Ref Range   Hemoglobin A1C     HbA1c POC (<> result, manual entry)     HbA1c, POC (prediabetic range)     HbA1c, POC (controlled diabetic range) 7.4 (A) 0.0 - 7.0 %  Compliant with meds which are metformin 1000 mg in the morning, 500 mg at noon and 1000 mg in the evening and Amaryl 4 mg daily.  Checking BS every morning before BF.  Range 68-145.  Below 80 about once a wk Does well with eating habits.  Most likely grill meat and a lot of fruits Not doing much walking recently because she fell 01/04/2023 sprained her LT ankle.  Patient states that she was walking out of her front door to go to her car and missed the first 2 steps.  Had seen ortho and was placed in a boot.  She stopped the boot because it was causing pain LT flank when she walked.  Purchased a ankle splint instead.  Now able to bear wgh and swelling has decreased.  Has f/u next wk -eye exam scheduled for 03/2023  IDA: Last CBC showed that hemoglobin had decreased from 13 to 11.7.  Advised to increase iron supplement to 4-5 times a week which she has been doing. Patient Active Problem List   Diagnosis Date Noted   Lumbar radicular pain 02/06/2022   Vitamin B 12 deficiency 12/20/2021   Iron deficiency anemia 08/28/2019   Influenza vaccine needed 04/03/2019    Hyperlipidemia associated with type 2 diabetes mellitus (HCC) 04/03/2019   Essential hypertension 06/07/2017   Chronic posterior anal fissure 03/26/2017   Prolapsed internal hemorrhoids, grade 2 03/26/2017   Glaucoma (increased eye pressure) 07/12/2015   Snoring 07/12/2015   Bothersome menopausal vasomotor symptoms 06/03/2014   Diabetes mellitus due to underlying condition without complications (HCC) 01/26/2014   Obesity 02/13/2013     Current Outpatient Medications on File Prior to Visit  Medication Sig Dispense Refill   Accu-Chek Softclix Lancets lancets Use as instructed 100 each 12   atorvastatin (LIPITOR) 40 MG tablet TAKE 1 TABLET BY MOUTH EVERY DAY 90 tablet 1   Blood Glucose Monitoring Suppl (ACCU-CHEK GUIDE) w/Device KIT UAD 1 kit 0   Blood Pressure Monitor DEVI Use as directed to check home blood pressure 2-3 times a week 1 Device 0   cholecalciferol (VITAMIN D) 1000 UNITS tablet Take 1,000 Units by mouth daily.     Ferrous Sulfate (IRON) 325 (65 Fe) MG TABS Take 1 tablet by mouth Mon-Fri. 100 tablet 1   gabapentin (NEURONTIN) 300 MG capsule TAKE 1 CAPSULE BY MOUTH EVERYDAY AT BEDTIME 90 capsule 2   glucose blood (ACCU-CHEK GUIDE) test strip Use as instructed 100 each 12   lidocaine (LIDODERM) 5 % Place 1 patch onto the  skin daily. Remove & Discard patch within 12 hours or as directed by MD 15 patch 0   lisinopril (ZESTRIL) 20 MG tablet Take 1 tablet (20 mg total) by mouth daily. 90 tablet 1   No current facility-administered medications on file prior to visit.    Allergies  Allergen Reactions   Asa [Aspirin] Other (See Comments)    shakey    Social History   Socioeconomic History   Marital status: Single    Spouse name: Not on file   Number of children: Not on file   Years of education: Not on file   Highest education level: Not on file  Occupational History   Occupation: Caregiver    Employer: [employer]  Tobacco Use   Smoking status:  Former    Types: Cigarettes    Quit date: 10/22/1983    Years since quitting: 39.2   Smokeless tobacco: Never  Vaping Use   Vaping Use: Never used  Substance and Sexual Activity   Alcohol use: No   Drug use: No   Sexual activity: Yes    Partners: Male  Other Topics Concern   Not on file  Social History Narrative   Personal care assistant   Married   Social Determinants of Health   Financial Resource Strain: Low Risk  (11/09/2022)   Overall Financial Resource Strain (CARDIA)    Difficulty of Paying Living Expenses: Not hard at all  Food Insecurity: No Food Insecurity (11/09/2022)   Hunger Vital Sign    Worried About Running Out of Food in the Last Year: Never true    Ran Out of Food in the Last Year: Never true  Transportation Needs: No Transportation Needs (11/09/2022)   PRAPARE - Administrator, Civil Service (Medical): No    Lack of Transportation (Non-Medical): No  Physical Activity: Sufficiently Active (11/09/2022)   Exercise Vital Sign    Days of Exercise per Week: 5 days    Minutes of Exercise per Session: 30 min  Stress: No Stress Concern Present (11/09/2022)   [employer] of Occupational Health - Occupational Stress Questionnaire    Feeling of Stress : Not at all  Social Connections: Moderately Isolated (11/09/2022)   Social Connection and Isolation Panel [NHANES]    Frequency of Communication with Friends and Family: More than three times a week    Frequency of Social Gatherings with Friends and Family: More than three times a week    Attends Religious Services: More than 4 times per year    Active Member of [employer] or Organizations: No    Attends Banker Meetings: Never    Marital Status: Divorced  Catering manager Violence: Not At Risk (11/09/2022)   Humiliation, Afraid, Rape, and Kick questionnaire    Fear of Current or Ex-Partner: No    Emotionally Abused: No    Physically Abused: No    Sexually Abused: No    Family History   Problem Relation Age of Onset   Diabetes Father    Cancer - Other Father        FROM CHEMICAL EXPOSURE   Hypertension Mother    Colon cancer Maternal Grandmother 3   Breast cancer Maternal Aunt    Stomach cancer Neg Hx    Pancreatic cancer Neg Hx     Past Surgical History:  Procedure Laterality Date   BILATERAL SALPINGOOPHORECTOMY  11/05/2002   COLONOSCOPY  09/2013   LACERATION REPAIR Right 1957   ankle from lawnmower  accident   LAPAROSCOPIC ASSISTED VAGINAL HYSTERECTOMY  11/05/2002   Dr. Marcelle Overlie, done for menorrhagia.  Benign pathology    ROS: Review of Systems Negative except as stated above  PHYSICAL EXAM: BP 105/66 (BP Location: Left Arm, Patient Position: Sitting, Cuff Size: Normal)   Pulse 78   Temp 98.3 F (36.8 C) (Oral)   Ht 5\' 7"  (1.702 m)   Wt 224 lb (101.6 kg)   SpO2 98%   BMI 35.08 kg/m   Wt Readings from Last 3 Encounters:  01/29/23 224 lb (101.6 kg)  11/09/22 224 lb (101.6 kg)  09/27/22 221 lb (100.2 kg)    Physical Exam  General appearance - alert, well appearing, and in no distress Mental status - normal mood, behavior, speech, dress, motor activity, and thought processes Chest - clear to auscultation, no wheezes, rales or rhonchi, symmetric air entry Heart - normal rate, regular rhythm, normal S1, S2, no murmurs, rubs, clicks or gallops Musculoskeletal -left ankle: She is wearing an ankle splint which I did not remove.  She is ambulating unassisted. Extremities -trace bilateral lower extremity edema.      Latest Ref Rng & Units 04/20/2022    9:21 AM 08/18/2021    2:42 PM 01/28/2020    4:11 PM  CMP  Glucose 70 - 99 mg/dL 161  096  76   BUN 8 - 27 mg/dL 13  10  13    Creatinine 0.57 - 1.00 mg/dL 0.45  4.09  8.11   Sodium 134 - 144 mmol/L 142  139  140   Potassium 3.5 - 5.2 mmol/L 4.3  4.6  4.2   Chloride 96 - 106 mmol/L 103  102  101   CO2 20 - 29 mmol/L 23  23  25    Calcium 8.7 - 10.3 mg/dL 9.7  9.8  9.4   Total Protein 6.0 - 8.5 g/dL  7.3     Total Bilirubin 0.0 - 1.2 mg/dL  0.7    Alkaline Phos 44 - 121 IU/L  87    AST 0 - 40 IU/L  19    ALT 0 - 32 IU/L  21     Lipid Panel     Component Value Date/Time   CHOL 100 12/19/2021 0932   TRIG 61 12/19/2021 0932   HDL 43 12/19/2021 0932   CHOLHDL 2.3 12/19/2021 0932   CHOLHDL 2.9 10/31/2015 1245   VLDL 15 10/31/2015 1245   LDLCALC 43 12/19/2021 0932    CBC    Component Value Date/Time   WBC 5.7 10/01/2022 1535   WBC 5.3 10/31/2015 1245   RBC 4.42 10/01/2022 1535   RBC 4.85 10/31/2015 1245   HGB 11.7 10/01/2022 1535   HCT 36.2 10/01/2022 1535   PLT 309 10/01/2022 1535   MCV 82 10/01/2022 1535   MCH 26.5 (L) 10/01/2022 1535   MCH 24.1 (L) 10/31/2015 1245   MCHC 32.3 10/01/2022 1535   MCHC 31.3 (L) 10/31/2015 1245   RDW 14.7 10/01/2022 1535   LYMPHSABS 2.4 10/26/2013 1154   MONOABS 0.3 10/26/2013 1154   EOSABS 0.1 10/26/2013 1154   BASOSABS 0.0 10/26/2013 1154    ASSESSMENT AND PLAN: 1. Type 2 diabetes mellitus with obesity (HCC) A1c not at goal. We decided not to make any change to the dose of metformin or Amaryl at this time.  She will work on getting her A1c back below 7.  Continue healthy eating habits.  Start walking again once her ankle is completely healed. - POCT  glucose (manual entry) - POCT glycosylated hemoglobin (Hb A1C) - glimepiride (AMARYL) 4 MG tablet; TAKE 1 TABLET BY MOUTH ONCE DAILY BEFORE BREAKFAST DOSE INCREASE  Dispense: 90 tablet; Refill: 1 - metFORMIN (GLUCOPHAGE) 500 MG tablet; TAKE 2 TABLETS BY MOUTH DAILY AFTER BREAKFAST, 1 TABLET AFTER LUNCH, AND 2 TABLETS AFTER DINNER  Dispense: 450 tablet; Refill: 1 - Lipid panel; Future  2. Diabetes mellitus treated with oral medication (HCC)   3. Hypertension associated with type 2 diabetes mellitus (HCC) At goal.  Continue amlodipine 10 mg and lisinopril 20 mg - amLODipine (NORVASC) 10 MG tablet; Take 1 tablet (10 mg total) by mouth daily.  Dispense: 90 tablet; Refill: 1  4. Iron  deficiency anemia, unspecified iron deficiency anemia type Continue iron supplement 4-5 times a week. - CBC; Future  5. Hyperlipidemia associated with type 2 diabetes mellitus (HCC) Continue atorvastatin 40 mg daily  6. History of recent fall Advised patient to be careful and look down when she is going up or down steps.  She will keep her follow-up appointment with orthopedics next week  7.  Sprain left ankle Clinically improving.  She will keep her follow-up appointment with orthopedics next week.  Patient was given the opportunity to ask questions.  Patient verbalized understanding of the plan and was able to repeat key elements of the plan.   This documentation was completed using Paediatric nurse.  Any transcriptional errors are unintentional.  Orders Placed This Encounter  Procedures   Lipid panel   CBC   POCT glucose (manual entry)   POCT glycosylated hemoglobin (Hb A1C)     Requested Prescriptions   Signed Prescriptions Disp Refills   glimepiride (AMARYL) 4 MG tablet 90 tablet 1    Sig: TAKE 1 TABLET BY MOUTH ONCE DAILY BEFORE BREAKFAST DOSE INCREASE   amLODipine (NORVASC) 10 MG tablet 90 tablet 1    Sig: Take 1 tablet (10 mg total) by mouth daily.   metFORMIN (GLUCOPHAGE) 500 MG tablet 450 tablet 1    Sig: TAKE 2 TABLETS BY MOUTH DAILY AFTER BREAKFAST, 1 TABLET AFTER LUNCH, AND 2 TABLETS AFTER DINNER    Return in about 4 months (around 06/01/2023).  Jonah Blue, MD, FACP

## 2023-02-05 DIAGNOSIS — S93492D Sprain of other ligament of left ankle, subsequent encounter: Secondary | ICD-10-CM | POA: Diagnosis not present

## 2023-02-06 ENCOUNTER — Other Ambulatory Visit: Payer: Self-pay | Admitting: Internal Medicine

## 2023-02-06 ENCOUNTER — Ambulatory Visit: Payer: Medicare PPO | Attending: Internal Medicine

## 2023-02-06 DIAGNOSIS — D509 Iron deficiency anemia, unspecified: Secondary | ICD-10-CM | POA: Diagnosis not present

## 2023-02-06 DIAGNOSIS — E1169 Type 2 diabetes mellitus with other specified complication: Secondary | ICD-10-CM

## 2023-02-06 DIAGNOSIS — E669 Obesity, unspecified: Secondary | ICD-10-CM | POA: Diagnosis not present

## 2023-02-07 LAB — LIPID PANEL
Chol/HDL Ratio: 2.3 ratio (ref 0.0–4.4)
Cholesterol, Total: 114 mg/dL (ref 100–199)
HDL: 49 mg/dL (ref >39)
LDL Chol Calc (NIH): 50 mg/dL (ref 0–99)
Triglycerides: 69 mg/dL (ref 0–149)
VLDL Cholesterol Cal: 15 mg/dL (ref 5–40)

## 2023-02-07 LAB — CBC
Hematocrit: 37.5 % (ref 34.0–46.6)
Hemoglobin: 11.8 g/dL (ref 11.1–15.9)
MCH: 26.3 pg — ABNORMAL LOW (ref 26.6–33.0)
MCHC: 31.5 g/dL (ref 31.5–35.7)
MCV: 84 fL (ref 79–97)
Platelets: 308 10*3/uL (ref 150–450)
RBC: 4.48 x10E6/uL (ref 3.77–5.28)
RDW: 14 % (ref 11.7–15.4)
WBC: 4.4 10*3/uL (ref 3.4–10.8)

## 2023-03-22 ENCOUNTER — Other Ambulatory Visit: Payer: Self-pay | Admitting: Internal Medicine

## 2023-03-22 DIAGNOSIS — Z1231 Encounter for screening mammogram for malignant neoplasm of breast: Secondary | ICD-10-CM

## 2023-04-03 DIAGNOSIS — E119 Type 2 diabetes mellitus without complications: Secondary | ICD-10-CM | POA: Diagnosis not present

## 2023-04-03 DIAGNOSIS — H401131 Primary open-angle glaucoma, bilateral, mild stage: Secondary | ICD-10-CM | POA: Diagnosis not present

## 2023-04-03 DIAGNOSIS — Z961 Presence of intraocular lens: Secondary | ICD-10-CM | POA: Diagnosis not present

## 2023-04-03 LAB — HM DIABETES EYE EXAM

## 2023-04-09 ENCOUNTER — Other Ambulatory Visit: Payer: Self-pay | Admitting: Internal Medicine

## 2023-04-09 DIAGNOSIS — I10 Essential (primary) hypertension: Secondary | ICD-10-CM

## 2023-04-15 ENCOUNTER — Ambulatory Visit
Admission: RE | Admit: 2023-04-15 | Discharge: 2023-04-15 | Disposition: A | Discharge status: Home / Self Care | Payer: Medicare PPO | Source: Ambulatory Visit

## 2023-04-15 DIAGNOSIS — Z1231 Encounter for screening mammogram for malignant neoplasm of breast: Secondary | ICD-10-CM | POA: Diagnosis not present

## 2023-06-03 ENCOUNTER — Ambulatory Visit: Payer: Medicare PPO | Attending: Internal Medicine | Admitting: Internal Medicine

## 2023-06-03 VITALS — BP 107/64 | HR 85 | Temp 97.8°F | Ht 67.0 in | Wt 226.0 lb

## 2023-06-03 DIAGNOSIS — Z23 Encounter for immunization: Secondary | ICD-10-CM | POA: Diagnosis not present

## 2023-06-03 DIAGNOSIS — E1169 Type 2 diabetes mellitus with other specified complication: Secondary | ICD-10-CM | POA: Diagnosis not present

## 2023-06-03 DIAGNOSIS — E1159 Type 2 diabetes mellitus with other circulatory complications: Secondary | ICD-10-CM

## 2023-06-03 DIAGNOSIS — E669 Obesity, unspecified: Secondary | ICD-10-CM | POA: Diagnosis not present

## 2023-06-03 DIAGNOSIS — I152 Hypertension secondary to endocrine disorders: Secondary | ICD-10-CM | POA: Diagnosis not present

## 2023-06-03 DIAGNOSIS — E785 Hyperlipidemia, unspecified: Secondary | ICD-10-CM | POA: Diagnosis not present

## 2023-06-03 DIAGNOSIS — Z7984 Long term (current) use of oral hypoglycemic drugs: Secondary | ICD-10-CM | POA: Diagnosis not present

## 2023-06-03 LAB — GLUCOSE, POCT (MANUAL RESULT ENTRY)
POC Glucose: 69 mg/dL — AB (ref 70–99)
POC Glucose: 90 mg/dL (ref 70–99)

## 2023-06-03 LAB — POCT GLYCOSYLATED HEMOGLOBIN (HGB A1C): HbA1c, POC (controlled diabetic range): 7.8 % — AB (ref 0.0–7.0)

## 2023-06-03 MED ORDER — GLUCOSE 4 G PO CHEW
4.0000 | CHEWABLE_TABLET | Freq: Once | ORAL | Status: AC
Start: 2023-06-03 — End: ?

## 2023-06-03 MED ORDER — TRULICITY 0.75 MG/0.5ML ~~LOC~~ SOAJ
0.7500 mg | SUBCUTANEOUS | 4 refills | Status: DC
Start: 2023-06-03 — End: 2023-07-01

## 2023-06-03 NOTE — Progress Notes (Signed)
Patient ID: Rhonda Miles, female    DOB: [birth year]  MRN: [id number]  CC: Diabetes (DM f/u. Rachell Cipro BS episode on 05/31/2023. Valentino Hue to flu vax.)   Subjective: Rhonda Miles is a 72 y.o. female who presents for chronic ds management. Her concerns today include:  Patient with history of DM type II, HL, IDA, obesity, HTN.   DM: Results for orders placed or performed in visit on 06/03/23  POCT glycosylated hemoglobin (Hb A1C)  Result Value Ref Range   Hemoglobin A1C     HbA1c POC (<> result, manual entry)     HbA1c, POC (prediabetic range)     HbA1c, POC (controlled diabetic range) 7.8 (A) 0.0 - 7.0 %  POCT glucose (manual entry)  Result Value Ref Range   POC Glucose 69 (A) 70 - 99 mg/dl  Checks BS every mornings.  Gives range 70-120; this a.m was 119, yesterday was 110. BS low today in office today.  Reports she had a good lunch..  Reports she does not get low BS often.  Can tell when BS is low.  Keeps peanut butter nabs Compliant with meds which are metformin 1000 mg in the morning, 500 mg at noon and 1000 mg in the evening and Amaryl 4 mg daily.  HTN: Reports compliance with taking amlodipine 10 mg daily.  She limits salt in the foods. HL: Taking atorvastatin 40 mg daily as prescribed. Patient Active Problem List   Diagnosis Date Noted   Lumbar radicular pain 02/06/2022   Vitamin B 12 deficiency 12/20/2021   Iron deficiency anemia 08/28/2019   Influenza vaccine needed 04/03/2019   Hyperlipidemia associated with type 2 diabetes mellitus (HCC) 04/03/2019   Essential hypertension 06/07/2017   Chronic posterior anal fissure 03/26/2017   Prolapsed internal hemorrhoids, grade 2 03/26/2017   Glaucoma (increased eye pressure) 07/12/2015   Snoring 07/12/2015   Bothersome menopausal vasomotor symptoms 06/03/2014   Diabetes mellitus due to underlying condition without complications (HCC) 01/26/2014   Obesity 02/13/2013     Current Outpatient Medications on File Prior to Visit   Medication Sig Dispense Refill   Accu-Chek Softclix Lancets lancets Use as instructed 100 each 12   amLODipine (NORVASC) 10 MG tablet Take 1 tablet (10 mg total) by mouth daily. 90 tablet 1   atorvastatin (LIPITOR) 40 MG tablet TAKE 1 TABLET BY MOUTH EVERY DAY 90 tablet 1   Blood Glucose Monitoring Suppl (ACCU-CHEK GUIDE) w/Device KIT UAD 1 kit 0   Blood Pressure Monitor DEVI Use as directed to check home blood pressure 2-3 times a week 1 Device 0   cholecalciferol (VITAMIN D3) 10 MCG/ML LIQD oral liquid Take 400 Units by mouth daily.     cyanocobalamin (VITAMIN B12) 1000 MCG tablet Take 1,000 mcg by mouth daily.     Ferrous Sulfate (IRON) 325 (65 Fe) MG TABS Take 1 tablet by mouth Mon-Fri. 100 tablet 1   gabapentin (NEURONTIN) 300 MG capsule TAKE 1 CAPSULE BY MOUTH EVERYDAY AT BEDTIME 90 capsule 2   glimepiride (AMARYL) 4 MG tablet TAKE 1 TABLET BY MOUTH ONCE DAILY BEFORE BREAKFAST DOSE INCREASE 90 tablet 1   glucose blood (ACCU-CHEK GUIDE) test strip Use as instructed 100 each 12   lisinopril (ZESTRIL) 20 MG tablet TAKE 1 TABLET BY MOUTH EVERY DAY 90 tablet 1   metFORMIN (GLUCOPHAGE) 500 MG tablet TAKE 2 TABLETS BY MOUTH DAILY AFTER BREAKFAST, 1 TABLET AFTER LUNCH, AND 2 TABLETS AFTER DINNER 450 tablet 1   lidocaine (LIDODERM) 5 %  Place 1 patch onto the skin daily. Remove & Discard patch within 12 hours or as directed by MD (Patient not taking: Reported on 06/03/2023) 15 patch 0   No current facility-administered medications on file prior to visit.    Allergies  Allergen Reactions   Asa [Aspirin] Other (See Comments)    shakey    Social History   Socioeconomic History   Marital status: Single    Spouse name: Not on file   Number of children: Not on file   Years of education: Not on file   Highest education level: 12th grade  Occupational History   Occupation: Conservator, 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[employer]  Tobacco Use   Smoking status: Former    Current  packs/day: 0.00    Types: Cigarettes    Quit date: 10/22/1983    Years since quitting: 39.6   Smokeless tobacco: Never  Vaping Use   Vaping status: Never Used  Substance and Sexual Activity   Alcohol use: No   Drug use: No   Sexual activity: Yes    Partners: Male  Other Topics Concern   Not on file  Social History Narrative   Personal care assistant   Married   Social Determinants of Health   Financial Resource Strain: Low Risk  (05/30/2023)   Overall Financial Resource Strain (CARDIA)    Difficulty of Paying Living Expenses: Not hard at all  Food Insecurity: No Food Insecurity (05/30/2023)   Hunger Vital Sign    Worried About Running Out of Food in the Last Year: Never true    Ran Out of Food in the Last Year: Never true  Transportation Needs: No Transportation Needs (05/30/2023)   PRAPARE - Administrator, Civil Service (Medical): No    Lack of Transportation (Non-Medical): No  Physical Activity: Insufficiently Active (05/30/2023)   Exercise Vital Sign    Days of Exercise per Week: 2 days    Minutes of Exercise per Session: 30 min  Stress: No Stress Concern Present (05/30/2023)   [employer] of Occupational Health - Occupational Stress Questionnaire    Feeling of Stress : Not at all  Social Connections: Moderately Integrated (05/30/2023)   Social Connection and Isolation Panel [NHANES]    Frequency of Communication with Friends and Family: More than three times a week    Frequency of Social Gatherings with Friends and Family: More than three times a week    Attends Religious Services: More than 4 times per year    Active Member of Clubs or Organizations: Yes    Attends Banker Meetings: More than 4 times per year    Marital Status: Divorced  Intimate Partner Violence: Not At Risk (11/09/2022)   Humiliation, Afraid, Rape, and Kick questionnaire    Fear of Current or Ex-Partner: No    Emotionally Abused: No    Physically Abused: No    Sexually  Abused: No    Family History  Problem Relation Age of Onset   Diabetes Father    Cancer - Other Father        FROM CHEMICAL EXPOSURE   Hypertension Mother    Colon cancer Maternal Grandmother 57   Breast cancer Maternal Aunt    Stomach cancer Neg Hx    Pancreatic cancer Neg Hx     Past Surgical History:  Procedure Laterality Date   BILATERAL SALPINGOOPHORECTOMY  11/05/2002   COLONOSCOPY  09/2013   LACERATION REPAIR Right 1957   ankle  from lawnmower accident   LAPAROSCOPIC ASSISTED VAGINAL HYSTERECTOMY  11/05/2002   Dr. Marcelle Overlie, done for menorrhagia.  Benign pathology    ROS: Review of Systems Negative except as stated above  PHYSICAL EXAM: BP 107/64 (BP Location: Left Arm, Patient Position: Sitting, Cuff Size: Normal)   Pulse 85   Temp 97.8 F (36.6 C) (Oral)   Ht 5\' 7"  (1.702 m)   Wt 226 lb (102.5 kg)   SpO2 97%   BMI 35.40 kg/m   Wt Readings from Last 3 Encounters:  06/03/23 226 lb (102.5 kg)  01/29/23 224 lb (101.6 kg)  11/09/22 224 lb (101.6 kg)    Physical Exam  General appearance - alert, well appearing, and in no distress Mental status - normal mood, behavior, speech, dress, motor activity, and thought processes Neck - supple, no significant adenopathy Chest - clear to auscultation, no wheezes, rales or rhonchi, symmetric air entry Heart - normal rate, regular rhythm, normal S1, S2, no murmurs, rubs, clicks or gallops Extremities - peripheral pulses normal, no pedal edema, no clubbing or cyanosis      Latest Ref Rng & Units 04/20/2022    9:21 AM 08/18/2021    2:42 PM 01/28/2020    4:11 PM  CMP  Glucose 70 - 99 mg/dL 161  096  76   BUN 8 - 27 mg/dL 13  10  13    Creatinine 0.57 - 1.00 mg/dL 0.45  4.09  8.11   Sodium 134 - 144 mmol/L 142  139  140   Potassium 3.5 - 5.2 mmol/L 4.3  4.6  4.2   Chloride 96 - 106 mmol/L 103  102  101   CO2 20 - 29 mmol/L 23  23  25    Calcium 8.7 - 10.3 mg/dL 9.7  9.8  9.4   Total Protein 6.0 - 8.5 g/dL  7.3    Total  Bilirubin 0.0 - 1.2 mg/dL  0.7    Alkaline Phos 44 - 121 IU/L  87    AST 0 - 40 IU/L  19    ALT 0 - 32 IU/L  21     Lipid Panel     Component Value Date/Time   CHOL 114 02/06/2023 1023   TRIG 69 02/06/2023 1023   HDL 49 02/06/2023 1023   CHOLHDL 2.3 02/06/2023 1023   CHOLHDL 2.9 10/31/2015 1245   VLDL 15 10/31/2015 1245   LDLCALC 50 02/06/2023 1023    CBC    Component Value Date/Time   WBC 4.4 02/06/2023 1023   WBC 5.3 10/31/2015 1245   RBC 4.48 02/06/2023 1023   RBC 4.85 10/31/2015 1245   HGB 11.8 02/06/2023 1023   HCT 37.5 02/06/2023 1023   PLT 308 02/06/2023 1023   MCV 84 02/06/2023 1023   MCH 26.3 (L) 02/06/2023 1023   MCH 24.1 (L) 10/31/2015 1245   MCHC 31.5 02/06/2023 1023   MCHC 31.3 (L) 10/31/2015 1245   RDW 14.0 02/06/2023 1023   LYMPHSABS 2.4 10/26/2013 1154   MONOABS 0.3 10/26/2013 1154   EOSABS 0.1 10/26/2013 1154   BASOSABS 0.0 10/26/2013 1154    ASSESSMENT AND PLAN: 1. Type 2 diabetes mellitus with obesity (HCC) A1c not at goal.   Patient with hypoglycemic episode in the office today.  Was given 4 glucose tablets with subsequent increase in blood sugar to 90 prior to discharge.  Went over management of hypoglycemia. Stop Amaryl.  Continue metformin. Discussed trying her with GLP1 agent.  To help with diabetes control  and weight loss.  Looks like Trulicity is covered by her insurance.  Went over how medication works and possible side effects.  Advised that the medication can cause nausea, vomiting, pancreatitis, bowel blockage, palpitations, severe diarrhea/constipation.  Advised to stop the medicine and call if she develops any vomiting, abdominal pain, unable to move the bowels, palpitations.  She expressed understanding and is willing to try the medication. - POCT glycosylated hemoglobin (Hb A1C) - POCT glucose (manual entry) - Microalbumin / creatinine urine ratio - Comprehensive metabolic panel; Future - Dulaglutide (TRULICITY) 0.75 MG/0.5ML SOAJ;  Inject 0.75 mg into the skin once a week.  Dispense: 2 mL; Refill: 4 - POCT glucose (manual entry)  2. Hypertension associated with type 2 diabetes mellitus (HCC) At goal.  Continue amlodipine 10 mg daily and lisinopril 20 mg daily.  3. Hyperlipidemia associated with type 2 diabetes mellitus (HCC) Continue atorvastatin 40 mg daily  4. Encounter for immunization  - Flu Vaccine Trivalent High Dose (Fluad)     Patient was given the opportunity to ask questions.  Patient verbalized understanding of the plan and was able to repeat key elements of the plan.   This documentation was completed using Paediatric nurse.  Any transcriptional errors are unintentional.  Orders Placed This Encounter  Procedures   POCT glycosylated hemoglobin (Hb A1C)   POCT glucose (manual entry)     Requested Prescriptions    No prescriptions requested or ordered in this encounter    No follow-ups on file.  Jonah Blue, MD, FACP

## 2023-06-03 NOTE — Patient Instructions (Signed)
STOP Glimperide. Start Trulicity 0.75 mg once a week.

## 2023-06-04 DIAGNOSIS — E669 Obesity, unspecified: Secondary | ICD-10-CM | POA: Diagnosis not present

## 2023-06-04 DIAGNOSIS — E1169 Type 2 diabetes mellitus with other specified complication: Secondary | ICD-10-CM | POA: Diagnosis not present

## 2023-06-05 ENCOUNTER — Telehealth: Payer: Self-pay | Admitting: Internal Medicine

## 2023-06-05 LAB — MICROALBUMIN / CREATININE URINE RATIO
Creatinine, Urine: 242.6 mg/dL
Microalb/Creat Ratio: 5 mg/g{creat} (ref 0–29)
Microalbumin, Urine: 13.1 ug/mL

## 2023-06-06 ENCOUNTER — Ambulatory Visit: Payer: Medicare PPO | Attending: Internal Medicine

## 2023-06-06 ENCOUNTER — Other Ambulatory Visit: Payer: Self-pay

## 2023-06-06 DIAGNOSIS — E1169 Type 2 diabetes mellitus with other specified complication: Secondary | ICD-10-CM | POA: Diagnosis not present

## 2023-06-06 DIAGNOSIS — E669 Obesity, unspecified: Secondary | ICD-10-CM

## 2023-06-07 ENCOUNTER — Other Ambulatory Visit: Payer: Self-pay

## 2023-06-07 LAB — COMPREHENSIVE METABOLIC PANEL
ALT: 15 [IU]/L (ref 0–32)
AST: 17 [IU]/L (ref 0–40)
Albumin: 4.2 g/dL (ref 3.9–4.9)
Alkaline Phosphatase: 84 [IU]/L (ref 44–121)
BUN/Creatinine Ratio: 18 (ref 12–28)
BUN: 14 mg/dL (ref 8–27)
Bilirubin Total: 0.7 mg/dL (ref 0.0–1.2)
CO2: 19 mmol/L — ABNORMAL LOW (ref 20–29)
Calcium: 9.5 mg/dL (ref 8.7–10.3)
Chloride: 103 mmol/L (ref 96–106)
Creatinine, Ser: 0.78 mg/dL (ref 0.57–1.00)
Globulin, Total: 2.8 g/dL (ref 1.5–4.5)
Glucose: 200 mg/dL — ABNORMAL HIGH (ref 70–99)
Potassium: 4 mmol/L (ref 3.5–5.2)
Sodium: 143 mmol/L (ref 134–144)
Total Protein: 7 g/dL (ref 6.0–8.5)
eGFR: 82 mL/min/{1.73_m2} (ref >59)

## 2023-06-24 ENCOUNTER — Encounter: Payer: Self-pay | Admitting: Internal Medicine

## 2023-07-01 ENCOUNTER — Encounter: Payer: Self-pay | Admitting: Internal Medicine

## 2023-07-01 ENCOUNTER — Ambulatory Visit: Payer: Medicare PPO | Attending: Internal Medicine | Admitting: Internal Medicine

## 2023-07-01 ENCOUNTER — Other Ambulatory Visit: Payer: Self-pay

## 2023-07-01 VITALS — BP 110/71 | HR 77 | Temp 97.8°F | Ht 67.0 in | Wt 223.0 lb

## 2023-07-01 DIAGNOSIS — Z7984 Long term (current) use of oral hypoglycemic drugs: Secondary | ICD-10-CM | POA: Diagnosis not present

## 2023-07-01 DIAGNOSIS — E1169 Type 2 diabetes mellitus with other specified complication: Secondary | ICD-10-CM | POA: Diagnosis not present

## 2023-07-01 DIAGNOSIS — E669 Obesity, unspecified: Secondary | ICD-10-CM

## 2023-07-01 DIAGNOSIS — E119 Type 2 diabetes mellitus without complications: Secondary | ICD-10-CM

## 2023-07-01 DIAGNOSIS — Z7985 Long-term (current) use of injectable non-insulin antidiabetic drugs: Secondary | ICD-10-CM | POA: Diagnosis not present

## 2023-07-01 MED ORDER — TRULICITY 1.5 MG/0.5ML ~~LOC~~ SOAJ
1.5000 mg | SUBCUTANEOUS | 3 refills | Status: DC
  Filled 2023-07-01: qty 2, 28d supply, fill #0
  Filled 2023-07-29: qty 2, 28d supply, fill #1

## 2023-07-01 NOTE — Progress Notes (Signed)
Patient ID: Rhonda Miles, female    DOB: January 13, 1951  MRN: [id number]  CC: Follow-up (Trulicity f/u. Nicki Reaper that BS readings show higher BS with lowest reading being 110 and highest BS reading is 200 /)   Subjective: Rhonda Miles is a 72 y.o. female who presents for 1 mth f/u to see how she is doing on the Trulicity. Her concerns today include:  Patient with history of DM type II, HL, IDA, obesity, HTN.   DM/Obesity: Started on Trulicity on last visit 1 month ago.  She presents today for follow-up to see how she is tolerating the medication. Reports one episode of vomiting the 1st time she took the shot.  Pt thinks it was due to what she ate at that time - biscuit with gravy from [place] that was left in the car fro several hrs.  No vomiting since then.  No abdominal pain.  It has decreased her appetite. -checks BS in mornings.  Range 110-150.  Had 200 this a.m for the first time. Ate some peach cobbler and drank a regular soda last night.  Still on metformin 1000 mg in the morning, 500 mg at noon and 1000 mg  Patient Active Problem List   Diagnosis Date Noted   Lumbar radicular pain 02/06/2022   Vitamin B 12 deficiency 12/20/2021   Iron deficiency anemia 08/28/2019   Influenza vaccine needed 04/03/2019   Hyperlipidemia associated with type 2 diabetes mellitus (HCC) 04/03/2019   Essential hypertension 06/07/2017   Chronic posterior anal fissure 03/26/2017   Prolapsed internal hemorrhoids, grade 2 03/26/2017   Glaucoma (increased eye pressure) 07/12/2015   Snoring 07/12/2015   Bothersome menopausal vasomotor symptoms 06/03/2014   Diabetes mellitus due to underlying condition without complications (HCC) 01/26/2014   Obesity 02/13/2013     Current Outpatient Medications on File Prior to Visit  Medication Sig Dispense Refill   Accu-Chek Softclix Lancets lancets Use as instructed 100 each 12   amLODipine (NORVASC) 10 MG tablet Take 1 tablet (10 mg total) by mouth  daily. 90 tablet 1   atorvastatin (LIPITOR) 40 MG tablet TAKE 1 TABLET BY MOUTH EVERY DAY 90 tablet 1   Blood Glucose Monitoring Suppl (ACCU-CHEK GUIDE) w/Device KIT UAD 1 kit 0   Blood Pressure Monitor DEVI Use as directed to check home blood pressure 2-3 times a week 1 Device 0   cholecalciferol (VITAMIN D3) 10 MCG/ML LIQD oral liquid Take 400 Units by mouth daily.     cyanocobalamin (VITAMIN B12) 1000 MCG tablet Take 1,000 mcg by mouth daily.     Ferrous Sulfate (IRON) 325 (65 Fe) MG TABS Take 1 tablet by mouth Mon-Fri. 100 tablet 1   gabapentin (NEURONTIN) 300 MG capsule TAKE 1 CAPSULE BY MOUTH EVERYDAY AT BEDTIME 90 capsule 2   glucose blood (ACCU-CHEK GUIDE) test strip Use as instructed 100 each 12   lidocaine (LIDODERM) 5 % Place 1 patch onto the skin daily. Remove & Discard patch within 12 hours or as directed by MD 15 patch 0   lisinopril (ZESTRIL) 20 MG tablet TAKE 1 TABLET BY MOUTH EVERY DAY 90 tablet 1   metFORMIN (GLUCOPHAGE) 500 MG tablet TAKE 2 TABLETS BY MOUTH DAILY AFTER BREAKFAST, 1 TABLET AFTER LUNCH, AND 2 TABLETS AFTER DINNER 450 tablet 1   Current Facility-Administered Medications on File Prior to Visit  Medication Dose Route Frequency Provider Last Rate Last Admin   glucose chewable tablet 16 g  4 tablet Oral Once  Allergies  Allergen Reactions   Asa [Aspirin] Other (See Comments)    shakey    Social History   Socioeconomic History   Marital status: Single    Spouse name: Not on file   Number of children: Not on file   Years of education: Not on file   Highest education level: 12th grade  Occupational History   Occupation: Conservator, 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[employer]  Tobacco Use   Smoking status: Former    Current packs/day: 0.00    Types: Cigarettes    Quit date: 10/22/1983    Years since quitting: 39.7   Smokeless tobacco: Never  Vaping Use   Vaping status: Never Used  Substance and Sexual Activity   Alcohol use: No   Drug  use: No   Sexual activity: Yes    Partners: Male  Other Topics Concern   Not on file  Social History Narrative   Personal care assistant   Married   Social Determinants of Health   Financial Resource Strain: Low Risk  (07/01/2023)   Overall Financial Resource Strain (CARDIA)    Difficulty of Paying Living Expenses: Not hard at all  Food Insecurity: No Food Insecurity (07/01/2023)   Hunger Vital Sign    Worried About Running Out of Food in the Last Year: Never true    Ran Out of Food in the Last Year: Never true  Transportation Needs: No Transportation Needs (07/01/2023)   PRAPARE - Administrator, Civil Service (Medical): No    Lack of Transportation (Non-Medical): No  Physical Activity: Inactive (07/01/2023)   Exercise Vital Sign    Days of Exercise per Week: 0 days    Minutes of Exercise per Session: 0 min  Stress: No Stress Concern Present (07/01/2023)   [employer] of Occupational Health - Occupational Stress Questionnaire    Feeling of Stress : Not at all  Social Connections: Moderately Integrated (07/01/2023)   Social Connection and Isolation Panel [NHANES]    Frequency of Communication with Friends and Family: More than three times a week    Frequency of Social Gatherings with Friends and Family: Twice a week    Attends Religious Services: More than 4 times per year    Active Member of Clubs or Organizations: Yes    Attends Banker Meetings: 1 to 4 times per year    Marital Status: Divorced  Intimate Partner Violence: Not At Risk (07/01/2023)   Humiliation, Afraid, Rape, and Kick questionnaire    Fear of Current or Ex-Partner: No    Emotionally Abused: No    Physically Abused: No    Sexually Abused: No    Family History  Problem Relation Age of Onset   Diabetes Father    Cancer - Other Father        FROM CHEMICAL EXPOSURE   Hypertension Mother    Colon cancer Maternal Grandmother 108   Breast cancer Maternal Aunt    Stomach cancer Neg  Hx    Pancreatic cancer Neg Hx     Past Surgical History:  Procedure Laterality Date   BILATERAL SALPINGOOPHORECTOMY  11/05/2002   COLONOSCOPY  09/2013   LACERATION REPAIR Right 1957   ankle from lawnmower accident   LAPAROSCOPIC ASSISTED VAGINAL HYSTERECTOMY  11/05/2002   Dr. Marcelle Overlie, done for menorrhagia.  Benign pathology    ROS: Review of Systems Negative except as stated above  PHYSICAL EXAM: BP 110/71 (BP Location: Left Arm, Patient Position: Sitting, Cuff  Size: Large)   Pulse 77   Temp 97.8 F (36.6 C) (Oral)   Ht 5\' 7"  (1.702 m)   Wt 223 lb (101.2 kg)   SpO2 96%   BMI 34.93 kg/m   Wt Readings from Last 3 Encounters:  07/01/23 223 lb (101.2 kg)  06/03/23 226 lb (102.5 kg)  01/29/23 224 lb (101.6 kg)    Physical Exam  General appearance - alert, well appearing, older AAF and in no distress Mental status - normal mood, behavior, speech, dress, motor activity, and thought processes      Latest Ref Rng & Units 06/06/2023   11:01 AM 04/20/2022    9:21 AM 08/18/2021    2:42 PM  CMP  Glucose 70 - 99 mg/dL 259  563  875   BUN 8 - 27 mg/dL 14  13  10    Creatinine 0.57 - 1.00 mg/dL 6.43  3.29  5.18   Sodium 134 - 144 mmol/L 143  142  139   Potassium 3.5 - 5.2 mmol/L 4.0  4.3  4.6   Chloride 96 - 106 mmol/L 103  103  102   CO2 20 - 29 mmol/L 19  23  23    Calcium 8.7 - 10.3 mg/dL 9.5  9.7  9.8   Total Protein 6.0 - 8.5 g/dL 7.0   7.3   Total Bilirubin 0.0 - 1.2 mg/dL 0.7   0.7   Alkaline Phos 44 - 121 IU/L 84   87   AST 0 - 40 IU/L 17   19   ALT 0 - 32 IU/L 15   21    Lipid Panel     Component Value Date/Time   CHOL 114 02/06/2023 1023   TRIG 69 02/06/2023 1023   HDL 49 02/06/2023 1023   CHOLHDL 2.3 02/06/2023 1023   CHOLHDL 2.9 10/31/2015 1245   VLDL 15 10/31/2015 1245   LDLCALC 50 02/06/2023 1023    CBC    Component Value Date/Time   WBC 4.4 02/06/2023 1023   WBC 5.3 10/31/2015 1245   RBC 4.48 02/06/2023 1023   RBC 4.85 10/31/2015 1245   HGB  11.8 02/06/2023 1023   HCT 37.5 02/06/2023 1023   PLT 308 02/06/2023 1023   MCV 84 02/06/2023 1023   MCH 26.3 (L) 02/06/2023 1023   MCH 24.1 (L) 10/31/2015 1245   MCHC 31.5 02/06/2023 1023   MCHC 31.3 (L) 10/31/2015 1245   RDW 14.0 02/06/2023 1023   LYMPHSABS 2.4 10/26/2013 1154   MONOABS 0.3 10/26/2013 1154   EOSABS 0.1 10/26/2013 1154   BASOSABS 0.0 10/26/2013 1154    ASSESSMENT AND PLAN: 1. Type 2 diabetes mellitus with obesity (HCC) 2. Diabetes mellitus treated with oral medication (HCC) 3. Long-term (current) use of injectable non-insulin antidiabetic drugs -Patient tolerating Trulicity.  She is agreeable to increasing the dose from the 0.75 mg to 1.5 mg once a week.  Continue metformin.  Continue to monitor blood sugars.  Reminded again of possible side effects of the medication.  Please call or be seen if she develops any vomiting, abdominal pain, vomiting with abdominal pain, palpitations, severe diarrhea/constipation.   Patient was given the opportunity to ask questions.  Patient verbalized understanding of the plan and was able to repeat key elements of the plan.   This documentation was completed using Paediatric nurse.  Any transcriptional errors are unintentional.  No orders of the defined types were placed in this encounter.    Requested Prescriptions  Signed Prescriptions Disp Refills   Dulaglutide (TRULICITY) 1.5 MG/0.5ML SOAJ 2 mL 3    Sig: Inject 1.5 mg into the skin once a week.    Return in about 3 months (around 09/29/2023) for chronic ds management.  Jonah Blue, MD, FACP

## 2023-07-01 NOTE — Patient Instructions (Signed)
Increase Trulicity to 1.5 mg once a week.  Again please watch out for any vomiting, abdominal pain, severe diarrhea or constipation.  If you develop any vomiting, abdominal pain or abdominal pain with vomiting, palpitations etc, she should stop the medication and be seen.

## 2023-07-22 ENCOUNTER — Encounter: Payer: Self-pay | Admitting: Internal Medicine

## 2023-07-29 ENCOUNTER — Other Ambulatory Visit: Payer: Self-pay | Admitting: Internal Medicine

## 2023-07-29 ENCOUNTER — Encounter: Payer: Self-pay | Admitting: Internal Medicine

## 2023-07-29 ENCOUNTER — Other Ambulatory Visit: Payer: Self-pay

## 2023-07-29 DIAGNOSIS — E1169 Type 2 diabetes mellitus with other specified complication: Secondary | ICD-10-CM

## 2023-07-29 DIAGNOSIS — G6289 Other specified polyneuropathies: Secondary | ICD-10-CM

## 2023-07-31 ENCOUNTER — Other Ambulatory Visit: Payer: Self-pay | Admitting: Internal Medicine

## 2023-08-06 DIAGNOSIS — E119 Type 2 diabetes mellitus without complications: Secondary | ICD-10-CM | POA: Diagnosis not present

## 2023-08-06 DIAGNOSIS — H401131 Primary open-angle glaucoma, bilateral, mild stage: Secondary | ICD-10-CM | POA: Diagnosis not present

## 2023-08-06 DIAGNOSIS — Z961 Presence of intraocular lens: Secondary | ICD-10-CM | POA: Diagnosis not present

## 2023-08-08 ENCOUNTER — Other Ambulatory Visit: Payer: Self-pay

## 2023-08-12 IMAGING — MR MR LUMBAR SPINE W/O CM
4 of 5 series · 19 of 48 positions shown · non-contrast
Comparison: None Available.

CLINICAL DATA: Lumbar radiculopathy

EXAM:
MRI LUMBAR SPINE WITHOUT CONTRAST
TECHNIQUE: Multiplanar, multisequence MR imaging of the lumbar spine was
performed. No intravenous contrast was administered.

[Series 2: T2 · sagittal · 4.0mm · 0.55mm/px · 6 of 15 slices shown (1 of 2)]
[im 1/15]
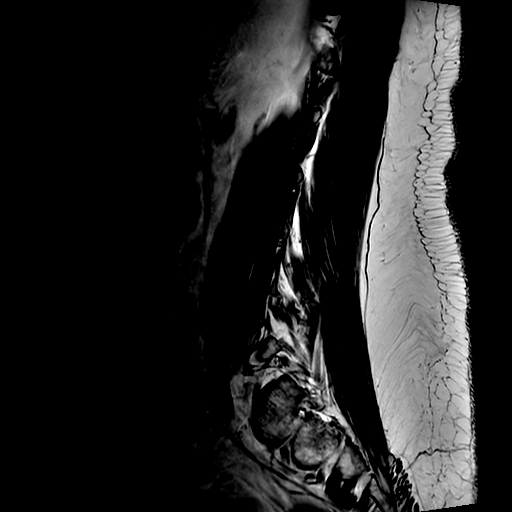
[im 3/15]
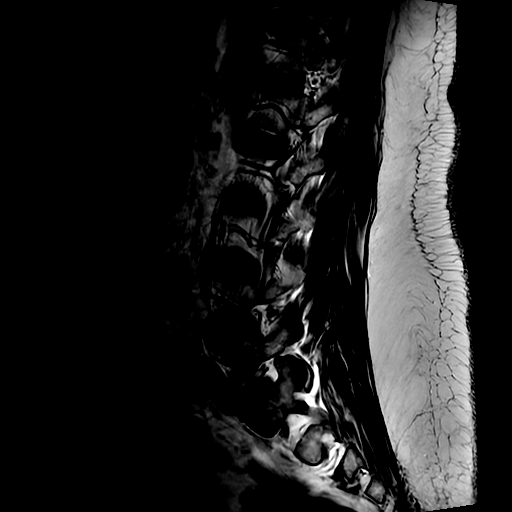
[im 6/15]
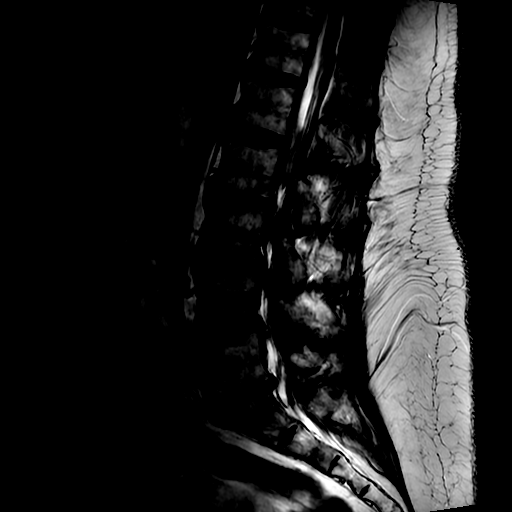
[im 9/15]
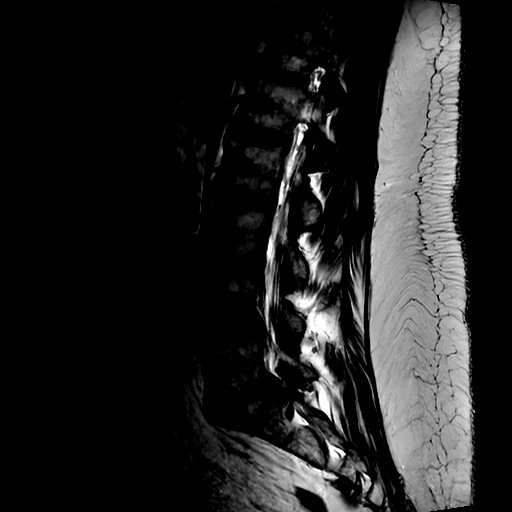
[im 12/15]
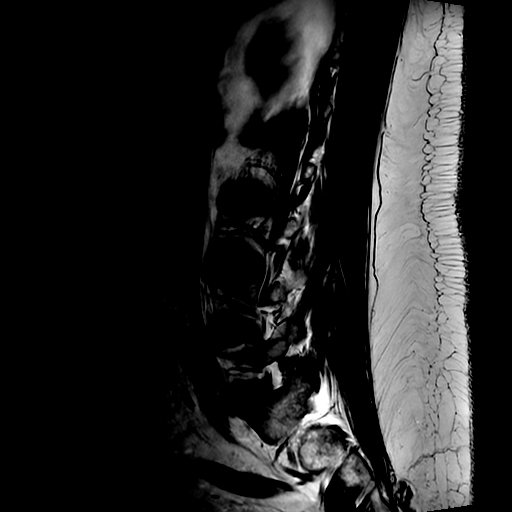
[im 15/15]
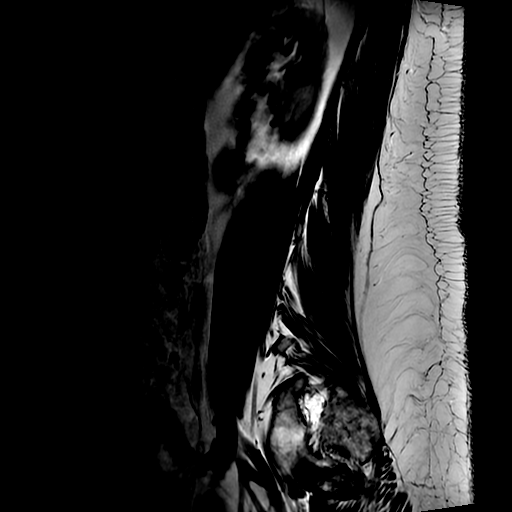

[Series 4: T1 · sagittal · 4.0mm · 0.55mm/px · 3 of 15 slices shown (1 of 2)]
[im 1/15]
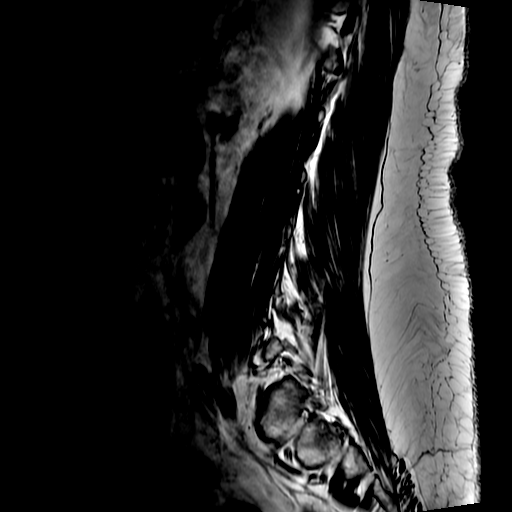
[im 8/15]
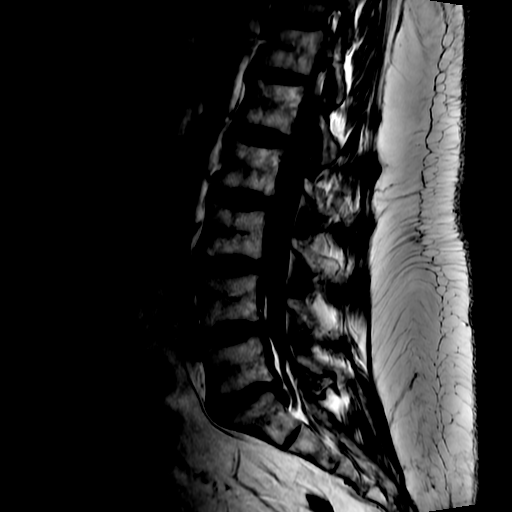
[im 15/15]
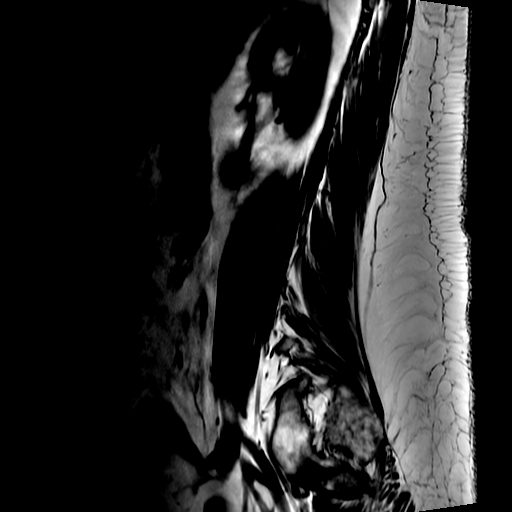

[Series 5: T2 · axial · 4.0mm · 0.39mm/px · z∈[-26,+148]mm · 7 of 45 slices shown (2 of 2)]
[im 3/45]
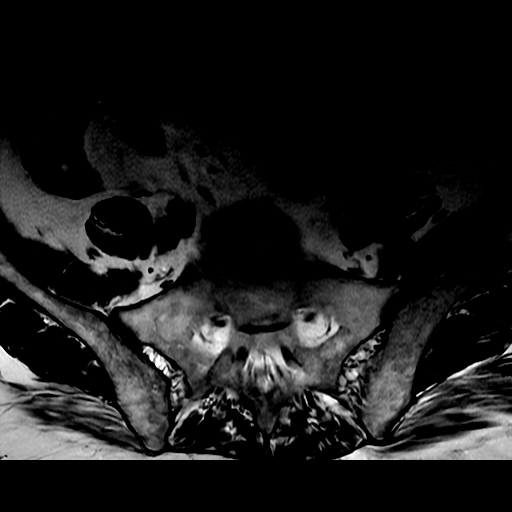
[im 6/45]
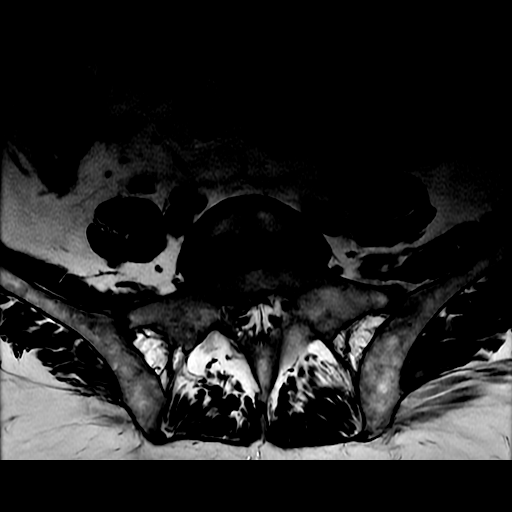
[im 9/45]
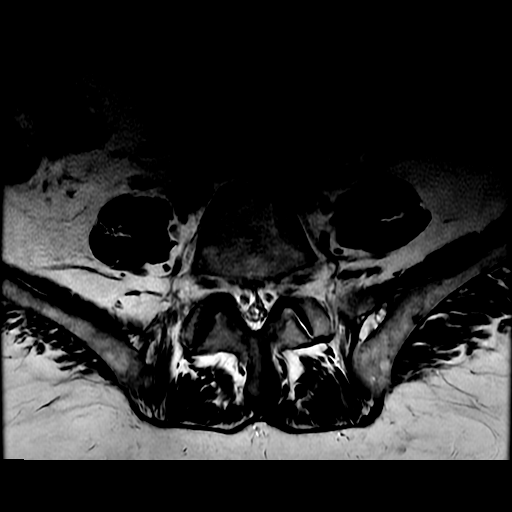
[im 15/45]
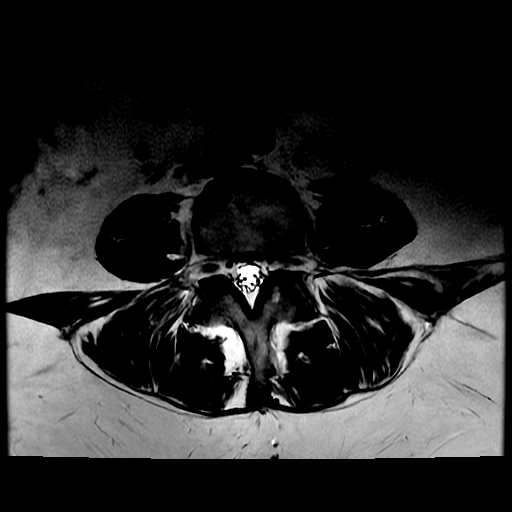
[im 21/45]
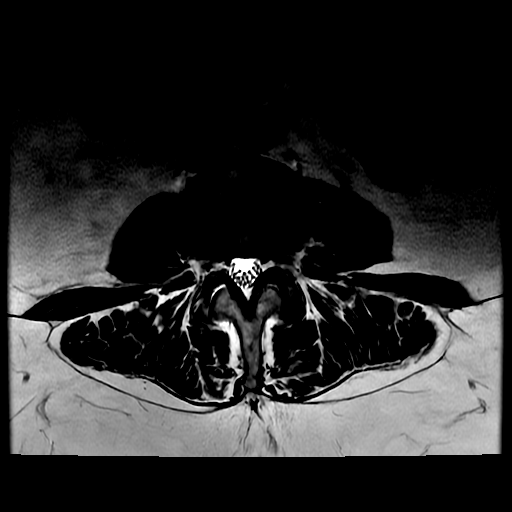
[im 24/45]
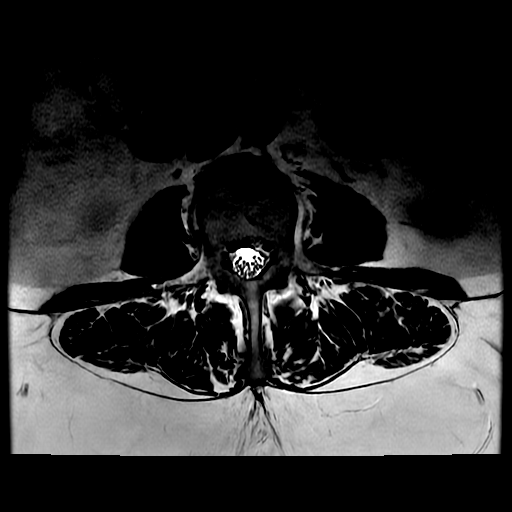
[im 39/45]
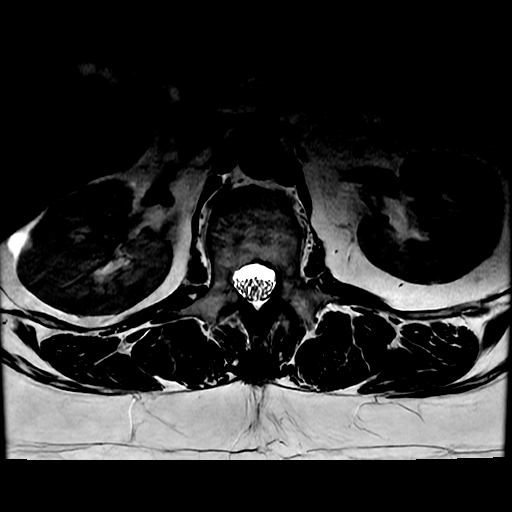

[Series 6: T1 · axial · 4.0mm · 0.39mm/px · z∈[-12,+148]mm · 3 of 45 slices shown (2 of 2)]
[im 6/45]
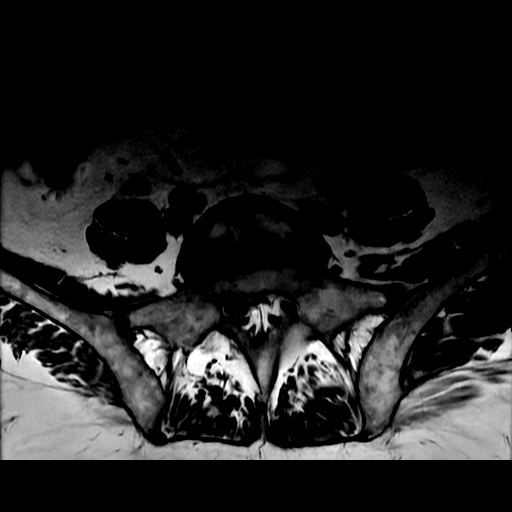
[im 24/45]
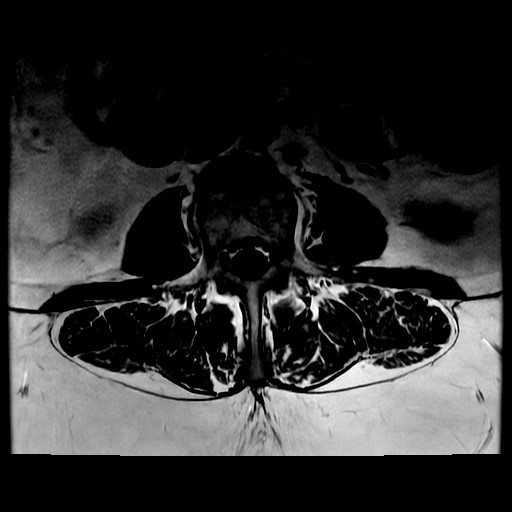
[im 39/45]
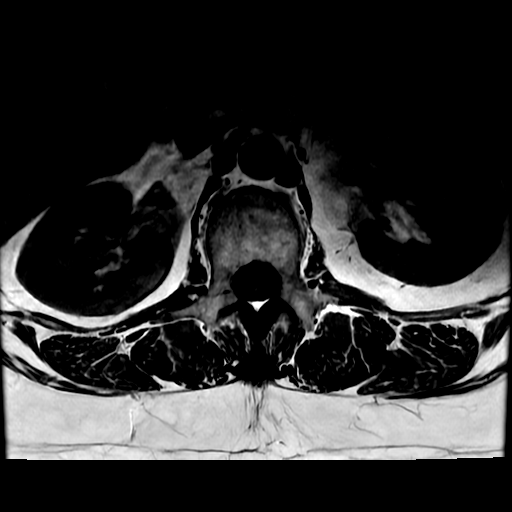

[19 of 48 positions shown; findings below may reference images not displayed]

FINDINGS: Segmentation:  Standard.

Alignment:  Physiologic.

Vertebrae:  No fracture, evidence of discitis, or bone lesion.

Conus medullaris and cauda equina: Conus extends to the L1 level.
Conus and cauda equina appear normal.

Paraspinal and other soft tissues: Negative.

Disc levels:

T12- L1 - L2-L3: Minor disc bulging.

L3-L4: Foraminal predominant disc bulging with left foraminal
annular fissure.

L4-L5: Mild disc bulging.

L5-S1:Disc narrowing and bulging with endplate spurring. Right
paracentral extrusion migrating inferiorly impinging on the right S1
nerve root. Degenerative facet spurring bilaterally.
IMPRESSION: Symptomatic finding is likely at L5-S1 where there is a right
paracentral extrusion impinging on the right S1 nerve root.

## 2023-08-24 ENCOUNTER — Other Ambulatory Visit: Payer: Self-pay | Admitting: Internal Medicine

## 2023-08-24 DIAGNOSIS — I10 Essential (primary) hypertension: Secondary | ICD-10-CM

## 2023-08-26 ENCOUNTER — Telehealth: Payer: Self-pay

## 2023-08-26 NOTE — Telephone Encounter (Signed)
Spoke with Deniece Portela at [facility] care . Confirmed Diabetes DX.

## 2023-08-26 NOTE — Telephone Encounter (Signed)
Copied from CRM [id number]. Topic: General - Other >> Aug 26, 2023 12:23 PM Geneva B wrote: Reason for CRM: [employer] is calling to see if patient has anything related to diabetes  Please [phone]

## 2023-09-02 ENCOUNTER — Encounter: Payer: Self-pay | Admitting: Internal Medicine

## 2023-09-02 ENCOUNTER — Other Ambulatory Visit: Payer: Self-pay

## 2023-09-04 ENCOUNTER — Encounter: Payer: Self-pay | Admitting: Internal Medicine

## 2023-09-07 ENCOUNTER — Other Ambulatory Visit: Payer: Self-pay | Admitting: Internal Medicine

## 2023-09-07 DIAGNOSIS — E1159 Type 2 diabetes mellitus with other circulatory complications: Secondary | ICD-10-CM

## 2023-09-08 DIAGNOSIS — S32010A Wedge compression fracture of first lumbar vertebra, initial encounter for closed fracture: Secondary | ICD-10-CM | POA: Diagnosis not present

## 2023-09-08 DIAGNOSIS — M5136 Other intervertebral disc degeneration, lumbar region with discogenic back pain only: Secondary | ICD-10-CM | POA: Diagnosis not present

## 2023-09-08 DIAGNOSIS — S3992XA Unspecified injury of lower back, initial encounter: Secondary | ICD-10-CM | POA: Diagnosis not present

## 2023-09-08 DIAGNOSIS — R296 Repeated falls: Secondary | ICD-10-CM | POA: Diagnosis not present

## 2023-09-08 DIAGNOSIS — M545 Low back pain, unspecified: Secondary | ICD-10-CM | POA: Diagnosis not present

## 2023-09-30 ENCOUNTER — Encounter: Payer: Self-pay | Admitting: Internal Medicine

## 2023-09-30 ENCOUNTER — Other Ambulatory Visit: Payer: Self-pay

## 2023-09-30 ENCOUNTER — Ambulatory Visit: Payer: Medicare Other | Attending: Internal Medicine | Admitting: Internal Medicine

## 2023-09-30 VITALS — BP 133/71 | HR 88 | Ht 67.0 in | Wt 224.0 lb

## 2023-09-30 DIAGNOSIS — Z8781 Personal history of (healed) traumatic fracture: Secondary | ICD-10-CM | POA: Diagnosis not present

## 2023-09-30 DIAGNOSIS — Z7985 Long-term (current) use of injectable non-insulin antidiabetic drugs: Secondary | ICD-10-CM | POA: Diagnosis not present

## 2023-09-30 DIAGNOSIS — E1169 Type 2 diabetes mellitus with other specified complication: Secondary | ICD-10-CM

## 2023-09-30 DIAGNOSIS — Z7984 Long term (current) use of oral hypoglycemic drugs: Secondary | ICD-10-CM | POA: Diagnosis not present

## 2023-09-30 DIAGNOSIS — Z6835 Body mass index (BMI) 35.0-35.9, adult: Secondary | ICD-10-CM

## 2023-09-30 DIAGNOSIS — E119 Type 2 diabetes mellitus without complications: Secondary | ICD-10-CM | POA: Diagnosis not present

## 2023-09-30 DIAGNOSIS — I10 Essential (primary) hypertension: Secondary | ICD-10-CM

## 2023-09-30 DIAGNOSIS — E785 Hyperlipidemia, unspecified: Secondary | ICD-10-CM | POA: Diagnosis not present

## 2023-09-30 DIAGNOSIS — Z1211 Encounter for screening for malignant neoplasm of colon: Secondary | ICD-10-CM

## 2023-09-30 DIAGNOSIS — I152 Hypertension secondary to endocrine disorders: Secondary | ICD-10-CM

## 2023-09-30 DIAGNOSIS — E669 Obesity, unspecified: Secondary | ICD-10-CM

## 2023-09-30 DIAGNOSIS — Z780 Asymptomatic menopausal state: Secondary | ICD-10-CM | POA: Diagnosis not present

## 2023-09-30 LAB — POCT GLYCOSYLATED HEMOGLOBIN (HGB A1C): HbA1c, POC (controlled diabetic range): 7.6 % — AB (ref 0.0–7.0)

## 2023-09-30 LAB — GLUCOSE, POCT (MANUAL RESULT ENTRY): POC Glucose: 76 mg/dL (ref 70–99)

## 2023-09-30 MED ORDER — TRULICITY 1.5 MG/0.5ML ~~LOC~~ SOAJ
1.5000 mg | SUBCUTANEOUS | 3 refills | Status: DC
  Filled 2023-09-30: qty 2, 28d supply, fill #0

## 2023-09-30 MED ORDER — TRULICITY 1.5 MG/0.5ML ~~LOC~~ SOAJ
1.5000 mg | SUBCUTANEOUS | 3 refills | Status: DC
Start: 2023-09-30 — End: 2023-09-30

## 2023-09-30 NOTE — Progress Notes (Signed)
 Patient ID: Rhonda Miles, female    DOB: Jan 04, 1951  MRN: [id number]  CC: Diabetes (DM & HTN f/u./Reports not taking Trulicty due to cost /)   Subjective: Rhonda Miles is a 73 y.o. female who presents for chronic ds management. Her concerns today include:  Patient with history of DM type II, HL, IDA, obesity, HTN.   Discussed the use of AI scribe software for clinical note transcription with the patient, who gave verbal consent to proceed.  History of Present Illness   The patient, with a history of type 2 diabetes, hypertension, and hyperlipidemia, presents for a follow-up visit.   DM: Results for orders placed or performed in visit on 09/30/23  POCT glucose (manual entry)   Collection Time: 09/30/23  4:08 PM  Result Value Ref Range   POC Glucose 76 70 - 99 mg/dl  POCT glycosylated hemoglobin (Hb A1C)   Collection Time: 09/30/23  4:20 PM  Result Value Ref Range   Hemoglobin A1C     HbA1c POC (<> result, manual entry)     HbA1c, POC (prediabetic range)     HbA1c, POC (controlled diabetic range) 7.6 (A) 0.0 - 7.0 %  She reports difficulty affording her diabetes medication, Trulicity, due to a high deductible of $500 and monthly copayment of $50 with her previous insurance, [employer]. As a result, she has been off Trulicity since the beginning of the year. She has since switched to [employer] and is interested in resuming Trulicity if it is more affordable with her new insurance. -In the meantime she went back to taking glimepiride 4 mg daily which we had stopped when restarted the Trulicity.  She continues to take the metformin as 1 g in the morning and at night and 500 mg at lunch. -She reports that her fasting blood sugars range from 79 to 120 mg/dL. However, she notes that while on Trulicity, her fasting blood sugars were often above 150 mg/dL. HTN: She continues to take lisinopril 20 mg daily and amlodipine 10 mg daily.  She limits salt in the foods. HL: Compliant  with taking atorvastatin 40 mg daily.  The patient also reports a recent fall on 09/08/2023 resulting in a fracture in her L1 vertebrae. She has been wearing a back brace, which she reports has helped with the pain. She is due to see a neurosurgeon for further evaluation on the 25th of this month.  Fall occurred when she was stepping up onto the porch of one of her clients house.  The step up was less than a foot high.  When she stepped onto the porch one of her foot was on the edge and she subsequently fell.  She is taking vitamin D 400 IU daily.  Not taking calcium.  Last DEXA scan was in 2021.   Patient Active Problem List   Diagnosis Date Noted   Lumbar radicular pain 02/06/2022   Vitamin B 12 deficiency 12/20/2021   Iron deficiency anemia 08/28/2019   Influenza vaccine needed 04/03/2019   Hyperlipidemia associated with type 2 diabetes mellitus (HCC) 04/03/2019   Essential hypertension 06/07/2017   Chronic posterior anal fissure 03/26/2017   Prolapsed internal hemorrhoids, grade 2 03/26/2017   Glaucoma (increased eye pressure) 07/12/2015   Snoring 07/12/2015   Bothersome menopausal vasomotor symptoms 06/03/2014   Diabetes mellitus due to underlying condition without complications (HCC) 01/26/2014   Obesity 02/13/2013     Current Outpatient Medications on File Prior to Visit  Medication Sig Dispense Refill  Accu-Chek Softclix Lancets lancets Use as instructed 100 each 12   amLODipine (NORVASC) 10 MG tablet TAKE 1 TABLET BY MOUTH EVERY DAY 90 tablet 1   atorvastatin (LIPITOR) 40 MG tablet TAKE 1 TABLET BY MOUTH EVERY DAY 90 tablet 1   Blood Glucose Monitoring Suppl (ACCU-CHEK GUIDE) w/Device KIT UAD 1 kit 0   Blood Pressure Monitor DEVI Use as directed to check home blood pressure 2-3 times a week 1 Device 0   cholecalciferol (VITAMIN D3) 10 MCG/ML LIQD oral liquid Take 400 Units by mouth daily.     cyanocobalamin (VITAMIN B12) 1000 MCG tablet Take 1,000 mcg by mouth daily.      Ferrous Sulfate (IRON) 325 (65 Fe) MG TABS Take 1 tablet by mouth Mon-Fri. 100 tablet 1   gabapentin (NEURONTIN) 300 MG capsule TAKE 1 CAPSULE BY MOUTH EVERYDAY AT BEDTIME 90 capsule 2   glucose blood (ACCU-CHEK GUIDE) test strip Use as instructed 100 each 12   lidocaine (LIDODERM) 5 % Place 1 patch onto the skin daily. Remove & Discard patch within 12 hours or as directed by MD 15 patch 0   lisinopril (ZESTRIL) 20 MG tablet TAKE 1 TABLET BY MOUTH EVERY DAY 90 tablet 1   metFORMIN (GLUCOPHAGE) 500 MG tablet TAKE 2 TABLETS BY MOUTH DAILY AFTER BREAKFAST, 1 TABLET AFTER LUNCH, AND 2 TABLETS AFTER DINNER 450 tablet 1   Current Facility-Administered Medications on File Prior to Visit  Medication Dose Route Frequency Provider Last Rate Last Admin   glucose chewable tablet 16 g  4 tablet Oral Once         Allergies  Allergen Reactions   Asa [Aspirin] Other (See Comments)    shakey    Social History   Socioeconomic History   Marital status: Single    Spouse name: Not on file   Number of children: Not on file   Years of education: Not on file   Highest education level: 12th grade  Occupational History   Occupation: Conservator, 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[employer] [place]  Tobacco Use   Smoking status: Former    Current packs/day: 0.00    Types: Cigarettes    Quit date: 10/22/1983    Years since quitting: 39.9   Smokeless tobacco: Never  Vaping Use   Vaping status: Never Used  Substance and Sexual Activity   Alcohol use: No   Drug use: No   Sexual activity: Yes    Partners: Male  Other Topics Concern   Not on file  Social History Narrative   Personal care assistant   Married   Social Drivers of Health   Financial Resource Strain: Low Risk  (07/01/2023)   Overall Financial Resource Strain (CARDIA)    Difficulty of Paying Living Expenses: Not hard at all  Food Insecurity: No Food Insecurity (07/01/2023)   Hunger Vital Sign    Worried About Running Out of Food in the Last  Year: Never true    Ran Out of Food in the Last Year: Never true  Transportation Needs: No Transportation Needs (07/01/2023)   PRAPARE - Administrator, Civil Service (Medical): No    Lack of Transportation (Non-Medical): No  Physical Activity: Inactive (07/01/2023)   Exercise Vital Sign    Days of Exercise per Week: 0 days    Minutes of Exercise per Session: 0 min  Stress: No Stress Concern Present (07/01/2023)   [employer] of Occupational Health - Occupational Stress Questionnaire    Feeling  of Stress : Not at all  Social Connections: Moderately Integrated (07/01/2023)   Social Connection and Isolation Panel [NHANES]    Frequency of Communication with Friends and Family: More than three times a week    Frequency of Social Gatherings with Friends and Family: Twice a week    Attends Religious Services: More than 4 times per year    Active Member of Clubs or Organizations: Yes    Attends Banker Meetings: 1 to 4 times per year    Marital Status: Divorced  Intimate Partner Violence: Not At Risk (07/01/2023)   Humiliation, Afraid, Rape, and Kick questionnaire    Fear of Current or Ex-Partner: No    Emotionally Abused: No    Physically Abused: No    Sexually Abused: No    Family History  Problem Relation Age of Onset   Diabetes Father    Cancer - Other Father        FROM CHEMICAL EXPOSURE   Hypertension Mother    Colon cancer Maternal Grandmother 69   Breast cancer Maternal Aunt    Stomach cancer Neg Hx    Pancreatic cancer Neg Hx     Past Surgical History:  Procedure Laterality Date   BILATERAL SALPINGOOPHORECTOMY  11/05/2002   COLONOSCOPY  09/2013   LACERATION REPAIR Right 1957   ankle from lawnmower accident   LAPAROSCOPIC ASSISTED VAGINAL HYSTERECTOMY  11/05/2002   Dr. Marcelle Overlie, done for menorrhagia.  Benign pathology    ROS: Review of Systems Negative except as stated above  PHYSICAL EXAM: BP 133/71 (BP Location: Left Arm, Patient  Position: Sitting)   Pulse 88   Ht 5\' 7"  (1.702 m)   Wt 224 lb (101.6 kg)   SpO2 97%   BMI 35.08 kg/m   Wt Readings from Last 3 Encounters:  09/30/23 224 lb (101.6 kg)  07/01/23 223 lb (101.2 kg)  06/03/23 226 lb (102.5 kg)    Physical Exam  General appearance - alert, well appearing, and in no distress Mental status - normal mood, behavior, speech, dress, motor activity, and thought processes Chest - clear to auscultation, no wheezes, rales or rhonchi, symmetric air entry Heart - normal rate, regular rhythm, normal S1, S2, no murmurs, rubs, clicks or gallops Musculoskeletal -patient is wearing a back brace.  I did not have her remove it. Extremities - peripheral pulses normal, no pedal edema, no clubbing or cyanosis      Latest Ref Rng & Units 06/06/2023   11:01 AM 04/20/2022    9:21 AM 08/18/2021    2:42 PM  CMP  Glucose 70 - 99 mg/dL 235  573  220   BUN 8 - 27 mg/dL 14  13  10    Creatinine 0.57 - 1.00 mg/dL 2.54  2.70  6.23   Sodium 134 - 144 mmol/L 143  142  139   Potassium 3.5 - 5.2 mmol/L 4.0  4.3  4.6   Chloride 96 - 106 mmol/L 103  103  102   CO2 20 - 29 mmol/L 19  23  23    Calcium 8.7 - 10.3 mg/dL 9.5  9.7  9.8   Total Protein 6.0 - 8.5 g/dL 7.0   7.3   Total Bilirubin 0.0 - 1.2 mg/dL 0.7   0.7   Alkaline Phos 44 - 121 IU/L 84   87   AST 0 - 40 IU/L 17   19   ALT 0 - 32 IU/L 15   21    Lipid Panel  Component Value Date/Time   CHOL 114 02/06/2023 1023   TRIG 69 02/06/2023 1023   HDL 49 02/06/2023 1023   CHOLHDL 2.3 02/06/2023 1023   CHOLHDL 2.9 10/31/2015 1245   VLDL 15 10/31/2015 1245   LDLCALC 50 02/06/2023 1023    CBC    Component Value Date/Time   WBC 4.4 02/06/2023 1023   WBC 5.3 10/31/2015 1245   RBC 4.48 02/06/2023 1023   RBC 4.85 10/31/2015 1245   HGB 11.8 02/06/2023 1023   HCT 37.5 02/06/2023 1023   PLT 308 02/06/2023 1023   MCV 84 02/06/2023 1023   MCH 26.3 (L) 02/06/2023 1023   MCH 24.1 (L) 10/31/2015 1245   MCHC 31.5 02/06/2023  1023   MCHC 31.3 (L) 10/31/2015 1245   RDW 14.0 02/06/2023 1023   LYMPHSABS 2.4 10/26/2013 1154   MONOABS 0.3 10/26/2013 1154   EOSABS 0.1 10/26/2013 1154   BASOSABS 0.0 10/26/2013 1154    ASSESSMENT AND PLAN: 1. Type 2 diabetes mellitus with obesity (HCC) (Primary) A1c is not at goal.  She has been off Trulicity for about 2 months due to insurance issues.  She now has a different insurance and would like to get back on the Trulicity if she does not have a high deductible.  We will put her back on the 1.5 mg dose once a week.  She will continue with the metformin and the glimepiride for now.  If once she is back on the Trulicity blood sugars start dropping too low, advised to stop the glimepiride. - POCT glycosylated hemoglobin (Hb A1C) - POCT glucose (manual entry) - Dulaglutide (TRULICITY) 1.5 MG/0.5ML SOAJ; Inject 1.5 mg into the skin once a week.  Dispense: 2 mL; Refill: 3  2. Diabetes mellitus treated with oral medication (HCC) 3. Long-term (current) use of injectable non-insulin antidiabetic drugs See #1 above.  4. Hypertension associated with diabetes (HCC) Close to goal.  Continue lisinopril 20 mg daily and Norvasc 10 mg daily  5. Hyperlipidemia associated with type 2 diabetes mellitus (HCC) Continue atorvastatin 40 mg daily  6. History of vertebral fracture -We will get a repeat bone density study.  Advised to increase vitamin D to 800 units IU daily.  Advised to purchase calcium 600 mg over-the-counter and take it twice a day. - DG Bone Density; Future  7. Postmenopausal estrogen deficiency - DG Bone Density; Future  8. Screening for colon cancer - Ambulatory referral to Gastroenterology     Patient was given the opportunity to ask questions.  Patient verbalized understanding of the plan and was able to repeat key elements of the plan.   This documentation was completed using Paediatric nurse.  Any transcriptional errors are  unintentional.  Orders Placed This Encounter  Procedures   DG Bone Density   Ambulatory referral to Gastroenterology   POCT glycosylated hemoglobin (Hb A1C)   POCT glucose (manual entry)     Requested Prescriptions   Signed Prescriptions Disp Refills   Dulaglutide (TRULICITY) 1.5 MG/0.5ML SOAJ 2 mL 3    Sig: Inject 1.5 mg into the skin once a week.    Return in about 4 months (around 01/30/2024).  Jonah Blue, MD, FACP

## 2023-10-01 ENCOUNTER — Other Ambulatory Visit: Payer: Self-pay

## 2023-10-01 ENCOUNTER — Encounter: Payer: Self-pay | Admitting: Internal Medicine

## 2023-10-02 ENCOUNTER — Other Ambulatory Visit: Payer: Self-pay | Admitting: Pharmacist

## 2023-10-02 DIAGNOSIS — E1169 Type 2 diabetes mellitus with other specified complication: Secondary | ICD-10-CM

## 2023-10-02 MED ORDER — ACCU-CHEK SOFTCLIX LANCETS MISC
6 refills | Status: AC
Start: 2023-10-02 — End: ?

## 2023-10-02 MED ORDER — ACCU-CHEK GUIDE W/DEVICE KIT
PACK | 0 refills | Status: AC
Start: 2023-10-02 — End: ?

## 2023-10-02 MED ORDER — ACCU-CHEK GUIDE TEST VI STRP
ORAL_STRIP | 6 refills | Status: DC
Start: 2023-10-02 — End: 2024-05-04

## 2023-10-07 ENCOUNTER — Other Ambulatory Visit: Payer: Self-pay

## 2023-10-07 ENCOUNTER — Other Ambulatory Visit: Payer: Self-pay | Admitting: Pharmacist

## 2023-10-07 MED ORDER — SEMAGLUTIDE (2 MG/DOSE) 8 MG/3ML ~~LOC~~ SOPN
2.0000 mg | PEN_INJECTOR | SUBCUTANEOUS | 1 refills | Status: DC
  Filled 2023-10-07 – 2023-11-14 (×2): qty 9, fill #0
  Filled 2023-12-16 – 2023-12-17 (×2): qty 9, 84d supply, fill #0
  Filled 2024-03-03 (×3): qty 9, 84d supply, fill #1

## 2023-10-07 MED ORDER — SEMAGLUTIDE (1 MG/DOSE) 4 MG/3ML ~~LOC~~ SOPN
1.0000 mg | PEN_INJECTOR | SUBCUTANEOUS | 0 refills | Status: DC
Start: 2023-10-07 — End: 2024-03-04
  Filled 2023-10-07 – 2023-11-14 (×3): qty 3, 28d supply, fill #0

## 2023-10-07 MED ORDER — OZEMPIC (0.25 OR 0.5 MG/DOSE) 2 MG/3ML ~~LOC~~ SOPN
0.5000 mg | PEN_INJECTOR | SUBCUTANEOUS | 0 refills | Status: AC
  Filled 2023-10-07 – 2023-10-15 (×2): qty 3, 28d supply, fill #0

## 2023-10-08 ENCOUNTER — Other Ambulatory Visit: Payer: Self-pay

## 2023-10-10 ENCOUNTER — Encounter: Payer: Self-pay | Admitting: Internal Medicine

## 2023-10-15 ENCOUNTER — Other Ambulatory Visit: Payer: Self-pay

## 2023-10-16 ENCOUNTER — Other Ambulatory Visit: Payer: Self-pay

## 2023-10-19 ENCOUNTER — Encounter: Payer: Self-pay | Admitting: Internal Medicine

## 2023-10-21 ENCOUNTER — Other Ambulatory Visit: Payer: Self-pay

## 2023-10-23 ENCOUNTER — Other Ambulatory Visit: Payer: Self-pay

## 2023-10-30 DIAGNOSIS — M5417 Radiculopathy, lumbosacral region: Secondary | ICD-10-CM | POA: Diagnosis not present

## 2023-10-30 DIAGNOSIS — S32010A Wedge compression fracture of first lumbar vertebra, initial encounter for closed fracture: Secondary | ICD-10-CM | POA: Diagnosis not present

## 2023-11-04 ENCOUNTER — Other Ambulatory Visit: Payer: Self-pay

## 2023-11-06 ENCOUNTER — Telehealth: Payer: Self-pay

## 2023-11-06 ENCOUNTER — Other Ambulatory Visit: Payer: Self-pay

## 2023-11-06 NOTE — Telephone Encounter (Signed)
 Received notification from [employer] regarding approval for [employer]. Patient assistance approved from 11/06/2023 to 07/22/2024.  Medication will ship to [address], [place], [place]  [address]  Pt ID: [id number]  Company phone: [phone]

## 2023-11-07 ENCOUNTER — Other Ambulatory Visit: Payer: Self-pay

## 2023-11-12 ENCOUNTER — Ambulatory Visit: Payer: Medicare HMO | Attending: Internal Medicine

## 2023-11-12 VITALS — Ht 67.0 in | Wt 224.0 lb

## 2023-11-12 DIAGNOSIS — Z0000 Encounter for general adult medical examination without abnormal findings: Secondary | ICD-10-CM

## 2023-11-12 NOTE — Progress Notes (Signed)
 Subjective:   Rhonda Miles is a 73 y.o. who presents for a Medicare Wellness preventive visit.  Visit Complete: Virtual I connected with  Rhonda Miles on 11/12/23 by a audio enabled telemedicine application and verified that I am speaking with the correct person using two identifiers.  Patient Location: Home  Provider Location: Home Office  I discussed the limitations of evaluation and management by telemedicine. The patient expressed understanding and agreed to proceed.  Vital Signs: Because this visit was a virtual/telehealth visit, some criteria may be missing or patient reported. Any vitals not documented were not able to be obtained and vitals that have been documented are patient reported.  VideoDeclined- This patient declined Librarian, academic. Therefore the visit was completed with audio only.  Persons Participating in Visit: Patient.  AWV Questionnaire: No: Patient Medicare AWV questionnaire was not completed prior to this visit.  Cardiac Risk Factors include: advanced age (>102men, >51 women);dyslipidemia;hypertension;diabetes mellitus     Objective:    Today's Vitals   11/12/23 0836  Weight: 224 lb (101.6 kg)  Height: 5\' 7"  (1.702 m)   Body mass index is 35.08 kg/m.     11/12/2023    8:47 AM 11/09/2022    9:58 AM 01/09/2022    1:56 PM 01/07/2022    8:49 PM 11/10/2021   10:34 AM 03/03/2020    4:07 PM 01/03/2017    1:41 PM  Advanced Directives  Does Patient Have a Medical Advance Directive? No No No  No No No  Would patient like information on creating a medical advance directive? Yes (MAU/Ambulatory/Procedural Areas - Information given) No - Patient declined   No - Patient declined Yes (ED - Information included in AVS)      Information is confidential and restricted. Go to Review Flowsheets to unlock data.    Current Medications (verified) Outpatient Encounter Medications as of 11/12/2023  Medication Sig   Accu-Chek  Softclix Lancets lancets Use to check blood sugar 3 times daily.   amLODipine  (NORVASC ) 10 MG tablet TAKE 1 TABLET BY MOUTH EVERY DAY   atorvastatin  (LIPITOR) 40 MG tablet TAKE 1 TABLET BY MOUTH EVERY DAY   Blood Glucose Monitoring Suppl (ACCU-CHEK GUIDE) w/Device KIT Use to check blood sugar 3 times daily. E11.69   Blood Pressure Monitor DEVI Use as directed to check home blood pressure 2-3 times a week   cholecalciferol (VITAMIN D3) 10 MCG/ML LIQD oral liquid Take 400 Units by mouth daily.   cyanocobalamin  (VITAMIN B12) 1000 MCG tablet Take 1,000 mcg by mouth daily.   Ferrous Sulfate  (IRON ) 325 (65 Fe) MG TABS Take 1 tablet by mouth Mon-Fri.   gabapentin  (NEURONTIN ) 300 MG capsule TAKE 1 CAPSULE BY MOUTH EVERYDAY AT BEDTIME   glucose blood (ACCU-CHEK GUIDE TEST) test strip Use to check blood sugar 3 times daily.   lidocaine  (LIDODERM ) 5 % Place 1 patch onto the skin daily. Remove & Discard patch within 12 hours or as directed by MD   lisinopril  (ZESTRIL ) 20 MG tablet TAKE 1 TABLET BY MOUTH EVERY DAY   metFORMIN  (GLUCOPHAGE ) 500 MG tablet TAKE 2 TABLETS BY MOUTH DAILY AFTER BREAKFAST, 1 TABLET AFTER LUNCH, AND 2 TABLETS AFTER DINNER   Semaglutide , 1 MG/DOSE, 4 MG/3ML SOPN Inject 1 mg as directed once a week. Start this once you finish 4 weeks of the 0.5 mg dose.   Semaglutide , 2 MG/DOSE, 8 MG/3ML SOPN Inject 2 mg as directed once a week. Start this dose once you finish  the 0.5 and 1 mg doses.   Semaglutide ,0.25 or 0.5MG /DOS, (OZEMPIC , 0.25 OR 0.5 MG/DOSE,) 2 MG/3ML SOPN Inject 0.5 mg into the skin once a week for 28 days.   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 Steve El, done for menorrhagia.  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[employer] [place]  Tobacco Use   Smoking status: Former    Current packs/day: 0.00    Types: Cigarettes    Quit date: 10/22/1983    Years since quitting: 40.0   Smokeless tobacco: Never  Vaping Use   Vaping status: Never Used  Substance and Sexual Activity   Alcohol use: No   Drug use: No   Sexual activity: Yes    Partners: Male  Other Topics Concern   Not on file  Social History Narrative   Personal care assistant   Married   Social Drivers of Health   Financial Resource Strain: Low Risk  (11/12/2023)   Overall Financial Resource Strain (CARDIA)    Difficulty of Paying Living Expenses: Not hard at all  Food Insecurity: No Food Insecurity (11/12/2023)   Hunger Vital Sign    Worried About Running Out of Food in the Last Year: Never true    Ran Out of Food in the Last Year: Never true  Transportation Needs: No Transportation Needs (11/12/2023)   PRAPARE - Administrator, Civil Service  (Medical): No    Lack of Transportation (Non-Medical): No  Physical Activity: Inactive (11/12/2023)   Exercise Vital Sign    Days of Exercise per Week: 0 days    Minutes of Exercise per Session: 0 min  Stress: No Stress Concern Present (11/12/2023)   [employer] of Occupational Health - Occupational Stress Questionnaire    Feeling of Stress : Not at all  Social Connections: Moderately Integrated (11/12/2023)   Social Connection and Isolation Panel [NHANES]    Frequency of Communication with Friends and Family: More than three times a week    Frequency of Social Gatherings with Friends and Family: Twice a week    Attends Religious Services: More than  4 times per year    Active Member of Clubs or Organizations: Yes    Attends Banker Meetings: 1 to 4 times per year    Marital Status: Divorced    Tobacco Counseling Counseling given: Not Answered    Clinical Intake:  Pre-visit preparation completed: Yes  Pain : No/denies pain     Diabetes: Yes CBG done?: No Did pt. bring in CBG monitor from home?: No  Lab Results  Component Value Date   HGBA1C 7.6 (A) 09/30/2023   HGBA1C 7.8 (A) 06/03/2023   HGBA1C 7.4 (A) 01/29/2023     How often do you need to have someone help you when you read instructions, pamphlets, or other written materials from your doctor or pharmacy?: 1 - Never  Interpreter Needed?: No  Information entered by :: Seabron Cypress LPN   Activities of Daily Living     11/12/2023    8:46 AM  In your present state of health, do you have any difficulty performing the following activities:  Hearing? 0  Vision? 0  Difficulty concentrating or making decisions? 0  Walking or climbing stairs? 0  Dressing or bathing? 0  Doing errands, shopping? 0  Preparing Food and eating ? N  Using the Toilet? N  In the past six months, have you accidently leaked urine? N  Do you have problems with loss of bowel control? N  Managing your Medications? N   Managing your Finances? N  Housekeeping or managing your Housekeeping? N    Patient Care Team: Lawrance Presume, MD as PCP - General (Internal Medicine) Devin Foerster, MD as Consulting Physician (Ophthalmology)  Indicate any recent Medical Services you may have received from other than [facility] providers in the past year (date may be approximate).     Assessment:   This is a routine wellness examination for Rhonda Miles.  Hearing/Vision screen Hearing Screening - Comments:: Denies hearing difficulties   Vision Screening - Comments:: Wears rx glasses - up to date with routine eye exams with [facility]    Goals Addressed   None    Depression Screen     11/12/2023    8:45 AM 09/30/2023    4:11 PM 07/01/2023    2:38 PM 06/03/2023    4:20 PM 01/29/2023   11:44 AM 11/09/2022    9:57 AM 09/27/2022   11:27 AM  PHQ 2/9 Scores  PHQ - 2 Score 0 0 0 0 0 0 0  PHQ- 9 Score 0 0 0 0 0 0 0    Fall Risk     11/12/2023    8:46 AM 01/29/2023   11:35 AM 11/09/2022    9:55 AM 09/27/2022   11:20 AM 04/20/2022    8:42 AM  Fall Risk   Falls in the past year? 0 1 0 0 0  Number falls in past yr: 0 0 0 0 0  Injury with Fall? 0 0 0 0 0  Risk for fall due to : No Fall Risks History of fall(s) No Fall Risks No Fall Risks No Fall Risks  Follow up Falls prevention discussed;Education provided;Falls evaluation completed  Falls prevention discussed      MEDICARE RISK AT HOME:  Medicare Risk at Home Any stairs in or around the home?: No If so, are there any without handrails?: No Home free of loose throw rugs in walkways, pet beds, electrical cords, etc?: Yes Adequate lighting in your home to reduce risk of falls?: Yes Life alert?: No Use  of a cane, walker or w/c?: No Grab bars in the bathroom?: Yes Shower chair or bench in shower?: No Elevated toilet seat or a handicapped toilet?: Yes  TIMED UP AND GO:  Was the test performed?  No  Cognitive Function: 6CIT completed    11/10/2021   10:37 AM  03/03/2020    4:11 PM  MMSE - Mini Mental State Exam  Orientation to time 5 5  Orientation to Place 5 5  Registration 3 3  Attention/ Calculation 5 5  Recall 3 2  Language- name 2 objects 2 2  Language- repeat 1 1  Language- follow 3 step command 3 3  Language- read & follow direction 1 1  Write a sentence 1 1  Copy design 1 1  Total score 30 29        11/12/2023    8:47 AM 11/09/2022    9:58 AM  6CIT Screen  What Year? 0 points 0 points  What month? 0 points 0 points  What time? 0 points 0 points  Count back from 20 0 points 0 points  Months in reverse 0 points 0 points  Repeat phrase 0 points 0 points  Total Score 0 points 0 points    Immunizations Immunization History  Administered Date(s) Administered   Fluad Trivalent(High Dose 65+) 06/03/2023   Influenza,inj,Quad PF,6+ Mos 04/27/2014, 05/10/2015, 05/29/2016, 06/07/2017, 03/20/2018, 04/07/2019, 04/05/2020, 04/14/2021, 04/20/2022   PFIZER(Purple Top)SARS-COV-2 Vaccination 08/06/2019, 08/27/2019, 05/05/2020   Pneumococcal Conjugate-13 06/26/2018   Pneumococcal Polysaccharide-23 04/27/2014, 03/07/2020   Tdap 03/29/2015   Zoster Recombinant(Shingrix) 03/07/2020, 05/09/2020    Screening Tests Health Maintenance  Topic Date Due   COVID-19 Vaccine (4 - 2024-25 season) 03/24/2023   FOOT EXAM  04/21/2023   Colonoscopy  10/08/2023   INFLUENZA VACCINE  02/21/2024   HEMOGLOBIN A1C  04/01/2024   OPHTHALMOLOGY EXAM  04/02/2024   MAMMOGRAM  04/14/2024   Diabetic kidney evaluation - Urine ACR  06/03/2024   Diabetic kidney evaluation - eGFR measurement  06/05/2024   Medicare Annual Wellness (AWV)  11/11/2024   DTaP/Tdap/Td (2 - Td or Tdap) 03/28/2025   Pneumonia Vaccine 6+ Years old  Completed   DEXA SCAN  Completed   Hepatitis C Screening  Completed   Zoster Vaccines- Shingrix  Completed   HPV VACCINES  Aged Out   Meningococcal B Vaccine  Aged Out    Health Maintenance  Health Maintenance Due  Topic Date Due    COVID-19 Vaccine (4 - 2024-25 season) 03/24/2023   FOOT EXAM  04/21/2023   Colonoscopy  10/08/2023   Health Maintenance Items Addressed: Patient is scheduled for colonoscopy on 12/06/23 and for dexa on 06/12/24   Additional Screening:  Vision Screening: Recommended annual ophthalmology exams for early detection of glaucoma and other disorders of the eye.  Dental Screening: Recommended annual dental exams for proper oral hygiene  Community Resource Referral / Chronic Care Management: CRR required this visit?  No   CCM required this visit?  No     Plan:     I have personally reviewed and noted the following in the patient's chart:   Medical and social history Use of alcohol, tobacco or illicit drugs  Current medications and supplements including opioid prescriptions. Patient is not currently taking opioid prescriptions. Functional ability and status Nutritional status Physical activity Advanced directives List of other physicians Hospitalizations, surgeries, and ER visits in previous 12 months Vitals Screenings to include cognitive, depression, and falls Referrals and appointments  In addition, I have  reviewed and discussed with patient certain preventive protocols, quality metrics, and best practice recommendations. A written personalized care plan for preventive services as well as general preventive health recommendations were provided to patient.     Seabron Cypress [place], [place]   10/29/8117   After Visit Summary: (MyChart) Due to this being a telephonic visit, the after visit summary with patients personalized plan was offered to patient via MyChart   Notes: Nothing significant to report at this time.

## 2023-11-12 NOTE — Patient Instructions (Signed)
 Rhonda Miles , Thank you for taking time to come for your Medicare Wellness Visit. I appreciate your ongoing commitment to your health goals. Please review the following plan we discussed and let me know if I can assist you in the future.   Referrals/Orders/Follow-Ups/Clinician Recommendations: Aim for 30 minutes of exercise or brisk walking, 6-8 glasses of water, and 5 servings of fruits and vegetables each day.  This is a list of the screening recommended for you and due dates:  Health Maintenance  Topic Date Due   COVID-19 Vaccine (4 - 2024-25 season) 03/24/2023   Complete foot exam   04/21/2023   Colon Cancer Screening  10/08/2023   Flu Shot  02/21/2024   Hemoglobin A1C  04/01/2024   Eye exam for diabetics  04/02/2024   Mammogram  04/14/2024   Yearly kidney health urinalysis for diabetes  06/03/2024   Yearly kidney function blood test for diabetes  06/05/2024   Medicare Annual Wellness Visit  11/11/2024   DTaP/Tdap/Td vaccine (2 - Td or Tdap) 03/28/2025   Pneumonia Vaccine  Completed   DEXA scan (bone density measurement)  Completed   Hepatitis C Screening  Completed   Zoster (Shingles) Vaccine  Completed   HPV Vaccine  Aged Out   Meningitis B Vaccine  Aged Out    Advanced directives: (ACP Link)Information on Advanced Care Planning can be found at [place]  Secretary of [facility] Advance Health Care Directives Advance Health Care Directives. [email or web address]   Next Medicare Annual Wellness Visit scheduled for next year: Yes

## 2023-11-13 ENCOUNTER — Encounter: Payer: Self-pay | Admitting: Internal Medicine

## 2023-11-14 ENCOUNTER — Other Ambulatory Visit: Payer: Self-pay

## 2023-11-14 ENCOUNTER — Other Ambulatory Visit: Payer: Self-pay | Admitting: Internal Medicine

## 2023-11-14 ENCOUNTER — Ambulatory Visit (AMBULATORY_SURGERY_CENTER): Admitting: *Deleted

## 2023-11-14 ENCOUNTER — Other Ambulatory Visit (HOSPITAL_COMMUNITY): Payer: Self-pay

## 2023-11-14 VITALS — Ht 67.0 in | Wt 223.0 lb

## 2023-11-14 DIAGNOSIS — Z800 Family history of malignant neoplasm of digestive organs: Secondary | ICD-10-CM

## 2023-11-14 DIAGNOSIS — Z1211 Encounter for screening for malignant neoplasm of colon: Secondary | ICD-10-CM

## 2023-11-14 MED ORDER — NA SULFATE-K SULFATE-MG SULF 17.5-3.13-1.6 GM/177ML PO SOLN: 1.0000 | Freq: Once | ORAL | 0 refills | Status: AC

## 2023-11-14 NOTE — Progress Notes (Signed)
 Pt's name and DOB verified at the beginning of the pre-visit wit 2 identifiers   Pt denies any difficulty with ambulating,sitting, laying down or rolling side to side  Pt has no issues with ambulation   Pt has no issues moving head neck or swallowing  No egg or soy allergy known to patient   No issues known to pt with past sedation with any surgeries or procedures  Pt denies having issues being intubated  No FH of Malignant Hyperthermia  Pt is not on diet pills or shots  Pt is not on home 02   Pt is not on blood thinners   Pt denies issues with constipation   Pt is not on dialysis  Pt denise any abnormal heart rhythms   Pt denies any upcoming cardiac testing  Patient's chart reviewed by Rogena Class CNRA prior to pre-visit and patient appropriate for the [facility].  Pre-visit completed and red dot placed by patient's name on their procedure day (on provider's schedule).    Visit by phone  Pt states weight is 223 lb   Pt given , [facility] main # and MD on call # prior to instructions.  Pt states understanding of instructions. Instructed to review again prior to procedure. Pt states they will.

## 2023-11-15 ENCOUNTER — Other Ambulatory Visit: Payer: Self-pay

## 2023-11-26 ENCOUNTER — Other Ambulatory Visit: Payer: Self-pay

## 2023-11-26 ENCOUNTER — Encounter: Payer: Self-pay | Admitting: Internal Medicine

## 2023-11-29 ENCOUNTER — Encounter (HOSPITAL_COMMUNITY): Payer: Self-pay

## 2023-12-03 ENCOUNTER — Other Ambulatory Visit: Payer: Self-pay | Admitting: Internal Medicine

## 2023-12-03 DIAGNOSIS — E1169 Type 2 diabetes mellitus with other specified complication: Secondary | ICD-10-CM

## 2023-12-05 NOTE — Progress Notes (Unsigned)
 [facility] History and Physical   Primary Care Physician:  Lawrance Presume, MD   Reason for Procedure:  Colon cancer screening  Plan:    Colonoscopy     HPI: Rhonda Miles is a 73 y.o. female with a history of anal fissure problems, last seen in 2018.  Colonoscopy in 2015 was normal.  For repeat exam today.   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 Steve El, done for menorrhagia.  Benign pathology    Prior to Admission medications   Medication Sig Start Date End Date Taking? Authorizing Provider  Accu-Chek Softclix Lancets lancets Use to check blood sugar 3 times daily. 10/02/23   Lawrance Presume, MD  amLODipine  (NORVASC ) 10 MG tablet TAKE 1 TABLET BY MOUTH EVERY DAY 09/09/23   Lawrance Presume, MD  atorvastatin  (LIPITOR) 40 MG tablet TAKE 1 TABLET BY MOUTH EVERY DAY 07/29/23   Lawrance Presume, MD  Blood Glucose Monitoring Suppl (ACCU-CHEK GUIDE) w/Device KIT Use to check blood sugar 3 times daily. E11.69 10/02/23   Lawrance Presume, MD  Blood Pressure Monitor DEVI Use as directed to check home blood pressure 2-3 times a week 12/26/18   Lawrance Presume, MD  cholecalciferol (VITAMIN D3) 10 MCG/ML LIQD oral liquid Take 400 Units by mouth daily.    [provider]  cyanocobalamin  (VITAMIN B12) 1000 MCG  tablet Take 1,000 mcg by mouth daily.    [provider]  Ferrous Sulfate  (IRON ) 325 (65 Fe) MG TABS Take 1 tablet by mouth Mon-Fri. 11/29/22   Lawrance Presume, MD  gabapentin  (NEURONTIN ) 300 MG capsule TAKE 1 CAPSULE BY MOUTH EVERYDAY AT BEDTIME 07/31/23   Lawrance Presume, MD  glimepiride  (AMARYL ) 4 MG tablet Take 4 mg by mouth every morning. 08/29/23   [provider]  glucose blood (ACCU-CHEK GUIDE TEST) test strip Use to check blood sugar 3 times daily. 10/02/23   Lawrance Presume, MD  latanoprost (XALATAN) 0.005 % ophthalmic solution 1 drop at bedtime. 09/06/23   [provider]  lidocaine  (LIDODERM ) 5 % Place 1 patch onto the skin daily. Remove & Discard patch within 12 hours or as directed by MD Patient not taking: Reported on 11/14/2023 01/08/22   Jerilynn Montenegro, MD  lisinopril  (ZESTRIL ) 20 MG tablet TAKE 1 TABLET BY MOUTH EVERY DAY 08/26/23   Lawrance Presume, MD  metFORMIN  (GLUCOPHAGE ) 500 MG tablet TAKE 2 TABLETS BY MOUTH DAILY AFTER BREAKFAST, 1 TABLET AFTER LUNCH, AND 2 TABLETS AFTER DINNER 12/03/23   Lawrance Presume, MD  OVER THE COUNTER MEDICATION daily at 12 noon. 750 mg    [provider]  Semaglutide , 1 MG/DOSE, 4 MG/3ML SOPN Inject 1 mg as directed once a week. Start this once you finish 4 weeks of the  0.5 mg dose. 10/07/23   Lawrance Presume, MD  Semaglutide , 2 MG/DOSE, 8 MG/3ML SOPN Inject 2 mg as directed once a week. Start this dose once you finish the 0.5 and 1 mg doses. Patient not taking: Reported on 11/14/2023 10/07/23   Lawrance Presume, MD    Current Outpatient Medications  Medication Sig Dispense Refill   Accu-Chek Softclix Lancets lancets Use to check blood sugar 3 times daily. 100 each 6   amLODipine  (NORVASC ) 10 MG tablet TAKE 1 TABLET BY MOUTH EVERY DAY 90 tablet 1   atorvastatin  (LIPITOR) 40 MG tablet TAKE 1 TABLET BY MOUTH EVERY DAY 90 tablet 1   Blood Glucose Monitoring Suppl (ACCU-CHEK GUIDE) w/Device KIT Use to check  blood sugar 3 times daily. E11.69 1 kit 0   gabapentin  (NEURONTIN ) 300 MG capsule TAKE 1 CAPSULE BY MOUTH EVERYDAY AT BEDTIME 90 capsule 2   glimepiride  (AMARYL ) 4 MG tablet Take 4 mg by mouth every morning.     glucose blood (ACCU-CHEK GUIDE TEST) test strip Use to check blood sugar 3 times daily. 100 each 6   latanoprost (XALATAN) 0.005 % ophthalmic solution 1 drop at bedtime.     lisinopril  (ZESTRIL ) 20 MG tablet TAKE 1 TABLET BY MOUTH EVERY DAY 90 tablet 1   metFORMIN  (GLUCOPHAGE ) 500 MG tablet TAKE 2 TABLETS BY MOUTH DAILY AFTER BREAKFAST, 1 TABLET AFTER LUNCH, AND 2 TABLETS AFTER DINNER 450 tablet 1   Blood Pressure Monitor DEVI Use as directed to check home blood pressure 2-3 times a week 1 Device 0   cholecalciferol (VITAMIN D3) 10 MCG/ML LIQD oral liquid Take 400 Units by mouth daily.     cyanocobalamin  (VITAMIN B12) 1000 MCG tablet Take 1,000 mcg by mouth daily.     Ferrous Sulfate  (IRON ) 325 (65 Fe) MG TABS Take 1 tablet by mouth Mon-Fri. 100 tablet 1   lidocaine  (LIDODERM ) 5 % Place 1 patch onto the skin daily. Remove & Discard patch within 12 hours or as directed by MD (Patient not taking: Reported on 11/14/2023) 15 patch 0   OVER THE COUNTER MEDICATION daily at 12 noon. 750 mg     Semaglutide , 1 MG/DOSE, 4 MG/3ML SOPN Inject 1 mg as directed once a week. Start this once you finish 4 weeks of the 0.5 mg dose. 3 mL 0   Semaglutide , 2 MG/DOSE, 8 MG/3ML SOPN Inject 2 mg as directed once a week. Start this dose once you finish the 0.5 and 1 mg doses. (Patient not taking: Reported on 12/06/2023) 9 mL 1   Current Facility-Administered Medications  Medication Dose Route Frequency Provider Last Rate Last Admin   0.9 %  sodium chloride  infusion  500 mL Intravenous Once Kenney Peacemaker, MD       glucose chewable tablet 16 g  4 tablet Oral Once         Allergies as of 12/06/2023 - Review Complete 12/06/2023  Allergen Reaction Noted   Asa [aspirin] Other (See Comments) 02/13/2013    Family  History  Problem Relation Age of Onset   Hypertension Mother    Diabetes Father    Cancer - Other Father        FROM CHEMICAL EXPOSURE   Breast cancer Maternal Aunt    Colon cancer Maternal Grandmother 54   Stomach cancer Neg Hx    Pancreatic cancer Neg Hx    Colon polyps Neg Hx    Esophageal cancer Neg Hx    Rectal cancer Neg Hx  Social History   Socioeconomic History   Marital status: Single    Spouse name: Not on file   Number of children: Not on file   Years of education: Not on file   Highest education level: 12th grade  Occupational History   Occupation: Conservator, 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[employer]  Tobacco Use   Smoking status: Former    Current packs/day: 0.00    Types: Cigarettes    Quit date: 10/22/1983    Years since quitting: 40.1   Smokeless tobacco: Never  Vaping Use   Vaping status: Never Used  Substance and Sexual Activity   Alcohol use: No   Drug use: No   Sexual activity: Yes    Partners: Male    Birth control/protection: Surgical  Other Topics Concern   Not on file  Social History Narrative   Personal care assistant   Married   Social Drivers of Health   Financial Resource Strain: Low Risk  (11/12/2023)   Overall Financial Resource Strain (CARDIA)    Difficulty of Paying Living Expenses: Not hard at all  Food Insecurity: No Food Insecurity (11/12/2023)   Hunger Vital Sign    Worried About Running Out of Food in the Last Year: Never true    Ran Out of Food in the Last Year: Never true  Transportation Needs: No Transportation Needs (11/12/2023)   PRAPARE - Administrator, Civil Service (Medical): No    Lack of Transportation (Non-Medical): No  Physical Activity: Inactive (11/12/2023)   Exercise Vital Sign    Days of Exercise per Week: 0 days    Minutes of Exercise per Session: 0 min  Stress: No Stress Concern Present (11/12/2023)   [employer] of Occupational Health - Occupational Stress Questionnaire     Feeling of Stress : Not at all  Social Connections: Moderately Integrated (11/12/2023)   Social Connection and Isolation Panel [NHANES]    Frequency of Communication with Friends and Family: More than three times a week    Frequency of Social Gatherings with Friends and Family: Twice a week    Attends Religious Services: More than 4 times per year    Active Member of [employer] or Organizations: Yes    Attends Banker Meetings: 1 to 4 times per year    Marital Status: Divorced  Catering manager Violence: Not At Risk (11/12/2023)   Humiliation, Afraid, Rape, and Kick questionnaire    Fear of Current or Ex-Partner: No    Emotionally Abused: No    Physically Abused: No    Sexually Abused: No    Review of Systems:  All other review of systems negative except as mentioned in the HPI.  Physical Exam: Vital signs Temp (!) 97.4 F (36.3 C)   General:   Alert,  Well-developed, well-nourished, pleasant and cooperative in NAD Lungs:  Clear throughout to auscultation.   Heart:  Regular rate and rhythm; no murmurs, clicks, rubs,  or gallops. Abdomen:  Soft, nontender and nondistended. Normal bowel sounds.   Neuro/Psych:  Alert and cooperative. Normal mood and affect. A and O x 3   @Minah Axelrod  Tammie Fall, MD, [facility] [phone] (pager) 12/06/2023 10:54 AM@

## 2023-12-06 ENCOUNTER — Ambulatory Visit (AMBULATORY_SURGERY_CENTER): Admitting: Internal Medicine

## 2023-12-06 ENCOUNTER — Encounter: Payer: Self-pay | Admitting: Internal Medicine

## 2023-12-06 VITALS — BP 123/57 | HR 78 | Temp 97.4°F | Resp 10

## 2023-12-06 DIAGNOSIS — K648 Other hemorrhoids: Secondary | ICD-10-CM | POA: Diagnosis not present

## 2023-12-06 DIAGNOSIS — Z1211 Encounter for screening for malignant neoplasm of colon: Secondary | ICD-10-CM

## 2023-12-06 MED ORDER — SODIUM CHLORIDE 0.9 % IV SOLN: 500.0000 mL | Freq: Once | INTRAVENOUS | Status: DC

## 2023-12-06 NOTE — Progress Notes (Signed)
 Pt's states no medical or surgical changes since previsit or office visit.

## 2023-12-06 NOTE — Progress Notes (Signed)
 Report given to PACU, vss

## 2023-12-06 NOTE — Patient Instructions (Addendum)
 No polyps or cancer were seen.  Your hemorrhoids are a little swollen, which is common with a colonoscopy prep.  No need for routine repeat colonoscopy.  I appreciate the opportunity to care for you. Kenney Peacemaker, MD, [facility]  Continue previous diet Continue present medications No routine colonoscopy due to current age and the absence of colonic polyps See handout on hemorrhoids YOU HAD AN ENDOSCOPIC PROCEDURE TODAY AT THE [facility]:   Refer to the procedure report that was given to you for any specific questions about what was found during the examination.  If the procedure report does not answer your questions, please call your gastroenterologist to clarify.  If you requested that your care partner not be given the details of your procedure findings, then the procedure report has been included in a sealed envelope for you to review at your convenience later.  YOU SHOULD EXPECT: Some feelings of bloating in the abdomen. Passage of more gas than usual.  Walking can help get rid of the air that was put into your GI tract during the procedure and reduce the bloating. If you had a lower endoscopy (such as a colonoscopy or flexible sigmoidoscopy) you may notice spotting of blood in your stool or on the toilet paper. If you underwent a bowel prep for your procedure, you may not have a normal bowel movement for a few days.  Please Note:  You might notice some irritation and congestion in your nose or some drainage.  This is from the oxygen used during your procedure.  There is no need for concern and it should clear up in a day or so.  SYMPTOMS TO REPORT IMMEDIATELY:  Following lower endoscopy (colonoscopy or flexible sigmoidoscopy):  Excessive amounts of blood in the stool  Significant tenderness or worsening of abdominal pains  Swelling of the abdomen that is new, acute  Fever of 100F or higher  For urgent or emergent issues, a gastroenterologist can be reached at any hour by  calling ([phone]. Do not use MyChart messaging for urgent concerns.   DIET:  We do recommend a small meal at first, but then you may proceed to your regular diet.  Drink plenty of fluids but you should avoid alcoholic beverages for 24 hours.  ACTIVITY:  You should plan to take it easy for the rest of today and you should NOT DRIVE or use heavy machinery until tomorrow (because of the sedation medicines used during the test).    FOLLOW UP: Our staff will call the number listed on your records the next business day following your procedure.  We will call around 7:15- 8:00 am to check on you and address any questions or concerns that you may have regarding the information given to you following your procedure. If we do not reach you, we will leave a message.     If any biopsies were taken you will be contacted by phone or by letter within the next 1-3 weeks.  Please call us  at [phone] if you have not heard about the biopsies in 3 weeks.   SIGNATURES/CONFIDENTIALITY: You and/or your care partner have signed paperwork which will be entered into your electronic medical record.  These signatures attest to the fact that that the information above on your After Visit Summary has been reviewed and is understood.  Full responsibility of the confidentiality of this discharge information lies with you and/or your care-partner.

## 2023-12-06 NOTE — Op Note (Signed)
 [facility] Patient Name: Rhonda Miles Procedure Date: 12/06/2023 10:57 AM MRN: [id number] Endoscopist: Kenney Peacemaker , MD, [id number] Age: 73 Referring MD:  Date of Birth: June 03, 1951 Gender: Female Account #: [id number] Procedure:                Colonoscopy Indications:              Screening for colorectal malignant neoplasm, Last                            colonoscopy: 2015 Medicines:                Monitored Anesthesia Care Procedure:                Pre-Anesthesia Assessment:                           - Prior to the procedure, a History and Physical                            was performed, and patient medications and                            allergies were reviewed. The patient's tolerance of                            previous anesthesia was also reviewed. The risks                            and benefits of the procedure and the sedation                            options and risks were discussed with the patient.                            All questions were answered, and informed consent                            was obtained. Prior Anticoagulants: The patient has                            taken no anticoagulant or antiplatelet agents. ASA                            Grade Assessment: II - A patient with mild systemic                            disease. After reviewing the risks and benefits,                            the patient was deemed in satisfactory condition to                            undergo the procedure.  After obtaining informed consent, the colonoscope                            was passed under direct vision. Throughout the                            procedure, the patient's blood pressure, pulse, and                            oxygen saturations were monitored continuously. The                            CF [id number] #[id number] was introduced through the anus                            and advanced to the the cecum,  identified by                            appendiceal orifice and ileocecal valve. The                            colonoscopy was performed with moderate difficulty                            due to significant looping. Successful completion                            of the procedure was aided by straightening and                            shortening the scope to obtain bowel loop reduction                            and applying RLQ abdominal pressure. The patient                            tolerated the procedure well. The quality of the                            bowel preparation was good. The ileocecal valve,                            appendiceal orifice, and rectum were photographed.                            The bowel preparation used was SUPREP via split                            dose instruction. Scope In: 11:03:37 AM Scope Out: 11:17:54 AM Scope Withdrawal Time: 0 hours 7 minutes 7 seconds  Total Procedure Duration: 0 hours 14 minutes 17 seconds  Findings:                 The perianal and digital rectal examinations were  normal.                           Internal hemorrhoids were found.                           The exam was otherwise without abnormality on                            direct and retroflexion views. Complications:            No immediate complications. Estimated Blood Loss:     Estimated blood loss: none. Impression:               - Internal hemorrhoids.                           - The examination was otherwise normal on direct                            and retroflexion views.                           - No specimens collected. Recommendation:           - Patient has a contact number available for                            emergencies. The signs and symptoms of potential                            delayed complications were discussed with the                            patient. Return to normal activities tomorrow.                             Written discharge instructions were provided to the                            patient.                           - Resume previous diet.                           - Continue present medications.                           - No routine repeat colonoscopy due to current age                            (40 years or older) and the absence of colonic                            polyps. Kenney Peacemaker, MD 12/06/2023 11:25:49 AM This report has been signed electronically.

## 2023-12-11 DIAGNOSIS — S32010D Wedge compression fracture of first lumbar vertebra, subsequent encounter for fracture with routine healing: Secondary | ICD-10-CM | POA: Diagnosis not present

## 2023-12-11 DIAGNOSIS — M5441 Lumbago with sciatica, right side: Secondary | ICD-10-CM | POA: Diagnosis not present

## 2023-12-11 DIAGNOSIS — M5442 Lumbago with sciatica, left side: Secondary | ICD-10-CM | POA: Diagnosis not present

## 2023-12-11 DIAGNOSIS — G8929 Other chronic pain: Secondary | ICD-10-CM | POA: Diagnosis not present

## 2023-12-17 ENCOUNTER — Other Ambulatory Visit: Payer: Self-pay

## 2023-12-17 DIAGNOSIS — E119 Type 2 diabetes mellitus without complications: Secondary | ICD-10-CM | POA: Diagnosis not present

## 2023-12-17 DIAGNOSIS — Z961 Presence of intraocular lens: Secondary | ICD-10-CM | POA: Diagnosis not present

## 2023-12-17 DIAGNOSIS — H401131 Primary open-angle glaucoma, bilateral, mild stage: Secondary | ICD-10-CM | POA: Diagnosis not present

## 2023-12-27 ENCOUNTER — Other Ambulatory Visit: Payer: Self-pay

## 2024-01-06 DIAGNOSIS — S32010D Wedge compression fracture of first lumbar vertebra, subsequent encounter for fracture with routine healing: Secondary | ICD-10-CM | POA: Diagnosis not present

## 2024-01-06 DIAGNOSIS — M5442 Lumbago with sciatica, left side: Secondary | ICD-10-CM | POA: Diagnosis not present

## 2024-01-06 DIAGNOSIS — M5441 Lumbago with sciatica, right side: Secondary | ICD-10-CM | POA: Diagnosis not present

## 2024-01-06 DIAGNOSIS — S32010A Wedge compression fracture of first lumbar vertebra, initial encounter for closed fracture: Secondary | ICD-10-CM | POA: Diagnosis not present

## 2024-01-06 DIAGNOSIS — G8929 Other chronic pain: Secondary | ICD-10-CM | POA: Diagnosis not present

## 2024-01-08 ENCOUNTER — Other Ambulatory Visit: Payer: Self-pay

## 2024-01-30 ENCOUNTER — Encounter: Payer: Self-pay | Admitting: Internal Medicine

## 2024-01-30 ENCOUNTER — Ambulatory Visit: Attending: Internal Medicine | Admitting: Internal Medicine

## 2024-01-30 VITALS — BP 123/72 | HR 95 | Temp 97.6°F | Ht 67.0 in | Wt 204.0 lb

## 2024-01-30 DIAGNOSIS — E669 Obesity, unspecified: Secondary | ICD-10-CM

## 2024-01-30 DIAGNOSIS — Z6831 Body mass index (BMI) 31.0-31.9, adult: Secondary | ICD-10-CM

## 2024-01-30 DIAGNOSIS — E162 Hypoglycemia, unspecified: Secondary | ICD-10-CM

## 2024-01-30 DIAGNOSIS — E1159 Type 2 diabetes mellitus with other circulatory complications: Secondary | ICD-10-CM

## 2024-01-30 DIAGNOSIS — G6289 Other specified polyneuropathies: Secondary | ICD-10-CM

## 2024-01-30 DIAGNOSIS — E785 Hyperlipidemia, unspecified: Secondary | ICD-10-CM

## 2024-01-30 DIAGNOSIS — E1169 Type 2 diabetes mellitus with other specified complication: Secondary | ICD-10-CM | POA: Diagnosis not present

## 2024-01-30 DIAGNOSIS — Z7984 Long term (current) use of oral hypoglycemic drugs: Secondary | ICD-10-CM

## 2024-01-30 DIAGNOSIS — Z7985 Long-term (current) use of injectable non-insulin antidiabetic drugs: Secondary | ICD-10-CM | POA: Diagnosis not present

## 2024-01-30 DIAGNOSIS — I10 Essential (primary) hypertension: Secondary | ICD-10-CM | POA: Diagnosis not present

## 2024-01-30 DIAGNOSIS — E119 Type 2 diabetes mellitus without complications: Secondary | ICD-10-CM

## 2024-01-30 LAB — POCT GLYCOSYLATED HEMOGLOBIN (HGB A1C): HbA1c, POC (controlled diabetic range): 6.2 % (ref 0.0–7.0)

## 2024-01-30 LAB — GLUCOSE, POCT (MANUAL RESULT ENTRY)
POC Glucose: 54 mg/dL — AB (ref 70–99)
POC Glucose: 112 mg/dL — AB (ref 70–99)
POC Glucose: 59 mg/dL — AB (ref 70–99)

## 2024-01-30 MED ORDER — METFORMIN HCL 500 MG PO TABS: ORAL_TABLET | ORAL | 1 refills | Status: DC

## 2024-01-30 MED ORDER — LISINOPRIL 20 MG PO TABS: 20.0000 mg | ORAL_TABLET | Freq: Every day | ORAL | 1 refills | Status: AC

## 2024-01-30 MED ORDER — GABAPENTIN 300 MG PO CAPS: ORAL_CAPSULE | ORAL | 2 refills | Status: AC

## 2024-01-30 MED ORDER — ATORVASTATIN CALCIUM 40 MG PO TABS: 40.0000 mg | ORAL_TABLET | Freq: Every day | ORAL | 1 refills | Status: DC

## 2024-01-30 MED ORDER — AMLODIPINE BESYLATE 10 MG PO TABS: 10.0000 mg | ORAL_TABLET | Freq: Every day | ORAL | 1 refills | Status: DC

## 2024-01-30 NOTE — Progress Notes (Signed)
 Patient ID: Rhonda Miles, female    DOB: October 23, 1950  MRN: [id number]  CC: Diabetes (DM f/u. Med refills. Layvonne refill for Ozempic  for 3 mg )   Subjective: Rhonda Miles is a 73 y.o. female who presents for chronic ds management. Her concerns today include:  Patient with history of DM type II, HL, IDA, obesity, HTN.   Discussed the use of AI scribe software for clinical note transcription with the patient, who gave verbal consent to proceed.  History of Present Illness Rhonda Miles is a 73 year old female with type 2 diabetes who presents for follow-up of her diabetes management.  DM: Results for orders placed or performed in visit on 01/30/24  POCT glucose (manual entry)   Collection Time: 01/30/24  4:48 PM  Result Value Ref Range   POC Glucose 54 (A) 70 - 99 mg/dl  POCT glycosylated hemoglobin (Hb A1C)   Collection Time: 01/30/24  4:55 PM  Result Value Ref Range   Hemoglobin A1C     HbA1c POC (<> result, manual entry)     HbA1c, POC (prediabetic range)     HbA1c, POC (controlled diabetic range) 6.2 0.0 - 7.0 %  POCT glucose (manual entry)   Collection Time: 01/30/24  5:36 PM  Result Value Ref Range   POC Glucose 59 (A) 70 - 99 mg/dl  POCT glucose (manual entry)   Collection Time: 01/30/24  5:36 PM  Result Value Ref Range   POC Glucose 112 (A) 70 - 99 mg/dl    She manages her diabetes with Ozempic  2mg  Q wk, metformin  1g/500mg /1g, and glimepiride . Her A1c has improved from 7.6% to 6.2% over the past four months. She is on the highest dose of Ozempic  at 2 mg and takes metformin  500 mg 2 tabs twice daily, with an additional 500mg  dose at lunch. Glimepiride  is used as needed, taking half of a 4 mg tablet when morning blood sugar is high. Last took this a.m. She monitors her blood sugar levels every morning, with readings ranging from 50-60 mg/dL to 850 mg/dL. Symptoms of hypoglycemia, such as feeling nervous and jittery, occur particularly with low blood  sugar. Her blood sugar was 54 mg/dL upon arrival today.  She has lost nearly 20 pounds since starting Ozempic , reducing her weight from 224 pounds to 204 pounds. This is attributed to decreased appetite and increased water intake, although she sometimes skips meals or eats late. She aims to reach a weight below 200 pounds.  HTN/HL: She reports compliance with taking lisinopril  20 mg daily and Norvasc  10 mg daily.  She is taking and tolerating atorvastatin  40 mg for cholesterol.    Patient Active Problem List   Diagnosis Date Noted   Lumbar radicular pain 02/06/2022   Vitamin B 12 deficiency 12/20/2021   Iron  deficiency anemia 08/28/2019   Influenza vaccine needed 04/03/2019   Hyperlipidemia associated with type 2 diabetes mellitus (HCC) 04/03/2019   Essential hypertension 06/07/2017   Chronic posterior anal fissure 03/26/2017   Prolapsed internal hemorrhoids, grade 2 03/26/2017   Glaucoma (increased eye pressure) 07/12/2015   Snoring 07/12/2015   Bothersome menopausal vasomotor symptoms 06/03/2014   Diabetes mellitus due to underlying condition without complications (HCC) 01/26/2014   Obesity 02/13/2013     Current Outpatient Medications on File Prior to Visit  Medication Sig Dispense Refill   Accu-Chek Softclix Lancets lancets Use to check blood sugar 3 times daily. 100 each 6   Blood Glucose Monitoring Suppl (ACCU-CHEK GUIDE) w/Device  KIT Use to check blood sugar 3 times daily. E11.69 1 kit 0   Blood Pressure Monitor DEVI Use as directed to check home blood pressure 2-3 times a week 1 Device 0   cholecalciferol (VITAMIN D3) 10 MCG/ML LIQD oral liquid Take 400 Units by mouth daily.     cyanocobalamin  (VITAMIN B12) 1000 MCG tablet Take 1,000 mcg by mouth daily.     Ferrous Sulfate  (IRON ) 325 (65 Fe) MG TABS Take 1 tablet by mouth Mon-Fri. 100 tablet 1   glimepiride  (AMARYL ) 4 MG tablet Take 4 mg by mouth every morning.     glucose blood (ACCU-CHEK GUIDE TEST) test strip Use to check  blood sugar 3 times daily. 100 each 6   latanoprost (XALATAN) 0.005 % ophthalmic solution 1 drop at bedtime.     lidocaine  (LIDODERM ) 5 % Place 1 patch onto the skin daily. Remove & Discard patch within 12 hours or as directed by MD 15 patch 0   OVER THE COUNTER MEDICATION daily at 12 noon. 750 mg     Semaglutide , 1 MG/DOSE, 4 MG/3ML SOPN Inject 1 mg as directed once a week. Start this once you finish 4 weeks of the 0.5 mg dose. 3 mL 0   Semaglutide , 2 MG/DOSE, 8 MG/3ML SOPN Inject 2 mg as directed once a week. Start this dose once you finish the 0.5 and 1 mg doses. 9 mL 1   Current Facility-Administered Medications on File Prior to Visit  Medication Dose Route Frequency Provider Last Rate Last Admin   glucose chewable tablet 16 g  4 tablet Oral Once         Allergies  Allergen Reactions   Asa [Aspirin] Other (See Comments)    shakey    Social History   Socioeconomic History   Marital status: Single    Spouse name: Not on file   Number of children: Not on file   Years of education: Not on file   Highest education level: 12th grade  Occupational History   Occupation: Conservator, 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[employer]  Tobacco Use   Smoking status: Former    Current packs/day: 0.00    Types: Cigarettes    Quit date: 10/22/1983    Years since quitting: 40.3   Smokeless tobacco: Never  Vaping Use   Vaping status: Never Used  Substance and Sexual Activity   Alcohol use: No   Drug use: No   Sexual activity: Yes    Partners: Male    Birth control/protection: Surgical  Other Topics Concern   Not on file  Social History Narrative   Personal care assistant   Married   Social Drivers of Health   Financial Resource Strain: Low Risk  (11/12/2023)   Overall Financial Resource Strain (CARDIA)    Difficulty of Paying Living Expenses: Not hard at all  Food Insecurity: No Food Insecurity (11/12/2023)   Hunger Vital Sign    Worried About Running Out of Food in the Last Year:  Never true    Ran Out of Food in the Last Year: Never true  Transportation Needs: No Transportation Needs (11/12/2023)   PRAPARE - Administrator, Civil Service (Medical): No    Lack of Transportation (Non-Medical): No  Physical Activity: Inactive (11/12/2023)   Exercise Vital Sign    Days of Exercise per Week: 0 days    Minutes of Exercise per Session: 0 min  Stress: No Stress Concern Present (11/12/2023)   [employer]  of Occupational Health - Occupational Stress Questionnaire    Feeling of Stress : Not at all  Social Connections: Moderately Integrated (11/12/2023)   Social Connection and Isolation Panel    Frequency of Communication with Friends and Family: More than three times a week    Frequency of Social Gatherings with Friends and Family: Twice a week    Attends Religious Services: More than 4 times per year    Active Member of Clubs or Organizations: Yes    Attends Banker Meetings: 1 to 4 times per year    Marital Status: Divorced  Intimate Partner Violence: Not At Risk (11/12/2023)   Humiliation, Afraid, Rape, and Kick questionnaire    Fear of Current or Ex-Partner: No    Emotionally Abused: No    Physically Abused: No    Sexually Abused: No    Family History  Problem Relation Age of Onset   Hypertension Mother    Diabetes Father    Cancer - Other Father        FROM CHEMICAL EXPOSURE   Breast cancer Maternal Aunt    Colon cancer Maternal Grandmother 91   Stomach cancer Neg Hx    Pancreatic cancer Neg Hx    Colon polyps Neg Hx    Esophageal cancer Neg Hx    Rectal cancer Neg Hx     Past Surgical History:  Procedure Laterality Date   BILATERAL SALPINGOOPHORECTOMY  11/05/2002   cataract surgery Bilateral    COLONOSCOPY  09/2013   LACERATION REPAIR Right 1957   ankle from lawnmower accident   LAPAROSCOPIC ASSISTED VAGINAL HYSTERECTOMY  11/05/2002   Dr. Johnnye, done for menorrhagia.  Benign pathology    ROS: Review of  Systems Negative except as stated above  PHYSICAL EXAM: BP 123/72 (BP Location: Left Arm, Patient Position: Sitting, Cuff Size: Normal)   Pulse 95   Temp 97.6 F (36.4 C) (Oral)   Ht 5' 7 (1.702 m)   Wt 204 lb (92.5 kg)   SpO2 99%   BMI 31.95 kg/m   Wt Readings from Last 3 Encounters:  01/30/24 204 lb (92.5 kg)  11/14/23 223 lb (101.2 kg)  11/12/23 224 lb (101.6 kg)    Physical Exam  General appearance - alert, well appearing, and in no distress Mental status - normal mood, behavior, speech, dress, motor activity, and thought processes Neck - supple, no significant adenopathy Chest - clear to auscultation, no wheezes, rales or rhonchi, symmetric air entry Heart - normal rate, regular rhythm, normal S1, S2, no murmurs, rubs, clicks or gallops Extremities - peripheral pulses normal, no pedal edema, no clubbing or cyanosis      Latest Ref Rng & Units 06/06/2023   11:01 AM 04/20/2022    9:21 AM 08/18/2021    2:42 PM  CMP  Glucose 70 - 99 mg/dL 799  865  870   BUN 8 - 27 mg/dL 14  13  10    Creatinine 0.57 - 1.00 mg/dL 9.21  9.35  9.19   Sodium 134 - 144 mmol/L 143  142  139   Potassium 3.5 - 5.2 mmol/L 4.0  4.3  4.6   Chloride 96 - 106 mmol/L 103  103  102   CO2 20 - 29 mmol/L 19  23  23    Calcium  8.7 - 10.3 mg/dL 9.5  9.7  9.8   Total Protein 6.0 - 8.5 g/dL 7.0   7.3   Total Bilirubin 0.0 - 1.2 mg/dL 0.7   0.7  Alkaline Phos 44 - 121 IU/L 84   87   AST 0 - 40 IU/L 17   19   ALT 0 - 32 IU/L 15   21    Lipid Panel     Component Value Date/Time   CHOL 114 02/06/2023 1023   TRIG 69 02/06/2023 1023   HDL 49 02/06/2023 1023   CHOLHDL 2.3 02/06/2023 1023   CHOLHDL 2.9 10/31/2015 1245   VLDL 15 10/31/2015 1245   LDLCALC 50 02/06/2023 1023    CBC    Component Value Date/Time   WBC 4.4 02/06/2023 1023   WBC 5.3 10/31/2015 1245   RBC 4.48 02/06/2023 1023   RBC 4.85 10/31/2015 1245   HGB 11.8 02/06/2023 1023   HCT 37.5 02/06/2023 1023   PLT 308 02/06/2023 1023    MCV 84 02/06/2023 1023   MCH 26.3 (L) 02/06/2023 1023   MCH 24.1 (L) 10/31/2015 1245   MCHC 31.5 02/06/2023 1023   MCHC 31.3 (L) 10/31/2015 1245   RDW 14.0 02/06/2023 1023   LYMPHSABS 2.4 10/26/2013 1154   MONOABS 0.3 10/26/2013 1154   EOSABS 0.1 10/26/2013 1154   BASOSABS 0.0 10/26/2013 1154    ASSESSMENT AND PLAN: 1. Type 2 diabetes mellitus with obesity (HCC) (Primary) A1c is at goal however she is having frequent hypoglycemic episodes.  Advised to stop the glimepiride  altogether.  Decrease metformin  to 1000 mg in the morning and at 1000 mg in the evening.  This means she will no longer take a lunchtime dose of 500 mg.  If after 2 days, she is still waking up with blood sugars less than 80, advised to decrease the metformin  again to 500 mg twice a day. -Advised to avoid skipping meals even though the Ozempic  decreases the appetite. Patient given 4 glucose tablets and drank a half a can of soda while here today.  Repeat blood sugar finally came back in the at 112. - POCT glycosylated hemoglobin (Hb A1C) - POCT glucose (manual entry) - Lipid panel - CBC - gabapentin  (NEURONTIN ) 300 MG capsule; TAKE 1 CAPSULE BY MOUTH EVERYDAY AT BEDTIME  Dispense: 90 capsule; Refill: 2 - metFORMIN  (GLUCOPHAGE ) 500 MG tablet; TAKE 2 TABLETS BY MOUTH DAILY AFTER BREAKFAST, AND 2 TABLETS AFTER DINNER  Dispense: 360 tablet; Refill: 1 - POCT glucose (manual entry) - POCT glucose (manual entry)  2. Long-term (current) use of injectable non-insulin  antidiabetic drugs 3. Diabetes mellitus treated with oral medication (HCC) See #1 above  4. Hyperlipidemia associated with type 2 diabetes mellitus (HCC) - atorvastatin  (LIPITOR) 40 MG tablet; Take 1 tablet (40 mg total) by mouth daily.  Dispense: 90 tablet; Refill: 1  5. Hypertension associated with diabetes (HCC) At goal.  Continue amlodipine  and lisinopril . - amLODipine  (NORVASC ) 10 MG tablet; Take 1 tablet (10 mg total) by mouth daily.  Dispense: 90  tablet; Refill: 1 - lisinopril  (ZESTRIL ) 20 MG tablet; Take 1 tablet (20 mg total) by mouth daily.  Dispense: 90 tablet; Refill: 1  6. Hypoglycemia See #1 above    Patient was given the opportunity to ask questions.  Patient verbalized understanding of the plan and was able to repeat key elements of the plan.   This documentation was completed using Paediatric nurse.  Any transcriptional errors are unintentional.  Orders Placed This Encounter  Procedures   Lipid panel   CBC   POCT glycosylated hemoglobin (Hb A1C)   POCT glucose (manual entry)   POCT glucose (manual entry)   POCT glucose (  manual entry)     Requested Prescriptions   Signed Prescriptions Disp Refills   amLODipine  (NORVASC ) 10 MG tablet 90 tablet 1    Sig: Take 1 tablet (10 mg total) by mouth daily.   atorvastatin  (LIPITOR) 40 MG tablet 90 tablet 1    Sig: Take 1 tablet (40 mg total) by mouth daily.   gabapentin  (NEURONTIN ) 300 MG capsule 90 capsule 2    Sig: TAKE 1 CAPSULE BY MOUTH EVERYDAY AT BEDTIME   lisinopril  (ZESTRIL ) 20 MG tablet 90 tablet 1    Sig: Take 1 tablet (20 mg total) by mouth daily.   metFORMIN  (GLUCOPHAGE ) 500 MG tablet 360 tablet 1    Sig: TAKE 2 TABLETS BY MOUTH DAILY AFTER BREAKFAST, AND 2 TABLETS AFTER DINNER    Return in about 4 months (around 06/01/2024).  Barnie Louder, MD, FACP

## 2024-01-30 NOTE — Patient Instructions (Signed)
 VISIT SUMMARY:  Today, we reviewed your diabetes management and made some adjustments to your medications. We also discussed your blood pressure, cholesterol, and follow-up for your spinal fracture. Your overall health and progress, including your weight loss, were also reviewed.  YOUR PLAN:  -TYPE 2 DIABETES MELLITUS: Type 2 diabetes is a condition where your body does not use insulin  properly, leading to high blood sugar levels. Your A1c has improved to 6.2%, but you have experienced low blood sugar levels. We will discontinue glimepiride  and reduce your metformin  dose to 1000 mg in the morning and evening. Monitor your blood sugars for 2-3 days, and if your morning sugars are below 80 mg/dL, reduce metformin  to 500 mg twice daily. Ensure you eat regular meals even if you have a decreased appetite from Ozempic . We will check your Ozempic  refills and send a prescription if needed.  -HYPERTENSION: Hypertension is high blood pressure. Your blood pressure is well-controlled with your current medications. Continue taking lisinopril  20 mg and amlodipine  10 mg as prescribed.  -HYPERLIPIDEMIA: Hyperlipidemia is having high levels of fats in your blood, which can increase the risk of heart disease. Continue taking atorvastatin  40 mg daily to manage your cholesterol levels.  -SPINAL FRACTURE: Your spinal fracture has healed, and you are undergoing physical therapy. Please attend your follow-up appointment with the neurosurgeon next Tuesday.  -GENERAL HEALTH MAINTENANCE: You are engaging in regular physical activity and are near your weight loss goal. Continue your physical activity and walking, and we will support and monitor your weight loss progress.  INSTRUCTIONS:  Monitor your blood sugars for 2-3 days. If your morning sugars are below 80 mg/dL, reduce metformin  to 500 mg twice daily. Attend your neurosurgeon follow-up appointment next Tuesday.

## 2024-01-31 ENCOUNTER — Ambulatory Visit: Payer: Self-pay | Admitting: Internal Medicine

## 2024-01-31 LAB — CBC
Hematocrit: 40.2 % (ref 34.0–46.6)
Hemoglobin: 12.5 g/dL (ref 11.1–15.9)
MCH: 26.7 pg (ref 26.6–33.0)
MCHC: 31.1 g/dL — ABNORMAL LOW (ref 31.5–35.7)
MCV: 86 fL (ref 79–97)
Platelets: 318 x10E3/uL (ref 150–450)
RBC: 4.69 x10E6/uL (ref 3.77–5.28)
RDW: 13.9 % (ref 11.7–15.4)
WBC: 6.2 x10E3/uL (ref 3.4–10.8)

## 2024-01-31 LAB — LIPID PANEL
Chol/HDL Ratio: 2.5 ratio (ref 0.0–4.4)
Cholesterol, Total: 79 mg/dL — ABNORMAL LOW (ref 100–199)
HDL: 32 mg/dL — ABNORMAL LOW (ref >39)
LDL Chol Calc (NIH): 31 mg/dL (ref 0–99)
Triglycerides: 71 mg/dL (ref 0–149)
VLDL Cholesterol Cal: 16 mg/dL (ref 5–40)

## 2024-02-04 DIAGNOSIS — S32010A Wedge compression fracture of first lumbar vertebra, initial encounter for closed fracture: Secondary | ICD-10-CM | POA: Diagnosis not present

## 2024-02-11 DIAGNOSIS — M5441 Lumbago with sciatica, right side: Secondary | ICD-10-CM | POA: Diagnosis not present

## 2024-02-11 DIAGNOSIS — G8929 Other chronic pain: Secondary | ICD-10-CM | POA: Diagnosis not present

## 2024-02-11 DIAGNOSIS — S32010D Wedge compression fracture of first lumbar vertebra, subsequent encounter for fracture with routine healing: Secondary | ICD-10-CM | POA: Diagnosis not present

## 2024-02-11 DIAGNOSIS — S32010A Wedge compression fracture of first lumbar vertebra, initial encounter for closed fracture: Secondary | ICD-10-CM | POA: Diagnosis not present

## 2024-02-11 DIAGNOSIS — M5442 Lumbago with sciatica, left side: Secondary | ICD-10-CM | POA: Diagnosis not present

## 2024-02-16 ENCOUNTER — Other Ambulatory Visit: Payer: Self-pay | Admitting: Internal Medicine

## 2024-02-16 DIAGNOSIS — E1169 Type 2 diabetes mellitus with other specified complication: Secondary | ICD-10-CM

## 2024-02-18 DIAGNOSIS — G8929 Other chronic pain: Secondary | ICD-10-CM | POA: Diagnosis not present

## 2024-02-18 DIAGNOSIS — M5442 Lumbago with sciatica, left side: Secondary | ICD-10-CM | POA: Diagnosis not present

## 2024-02-18 DIAGNOSIS — S32010A Wedge compression fracture of first lumbar vertebra, initial encounter for closed fracture: Secondary | ICD-10-CM | POA: Diagnosis not present

## 2024-02-18 DIAGNOSIS — M5441 Lumbago with sciatica, right side: Secondary | ICD-10-CM | POA: Diagnosis not present

## 2024-02-18 DIAGNOSIS — S32010D Wedge compression fracture of first lumbar vertebra, subsequent encounter for fracture with routine healing: Secondary | ICD-10-CM | POA: Diagnosis not present

## 2024-03-02 ENCOUNTER — Other Ambulatory Visit: Payer: Self-pay | Admitting: Internal Medicine

## 2024-03-02 DIAGNOSIS — Z1231 Encounter for screening mammogram for malignant neoplasm of breast: Secondary | ICD-10-CM

## 2024-03-03 ENCOUNTER — Encounter: Payer: Self-pay | Admitting: Internal Medicine

## 2024-03-03 ENCOUNTER — Other Ambulatory Visit: Payer: Self-pay

## 2024-03-04 ENCOUNTER — Other Ambulatory Visit: Payer: Self-pay

## 2024-03-04 ENCOUNTER — Other Ambulatory Visit: Payer: Self-pay | Admitting: Internal Medicine

## 2024-03-04 MED ORDER — SEMAGLUTIDE (2 MG/DOSE) 8 MG/3ML ~~LOC~~ SOPN
2.0000 mg | PEN_INJECTOR | SUBCUTANEOUS | 1 refills | Status: DC
  Filled 2024-06-04: qty 9, 84d supply, fill #0

## 2024-03-12 ENCOUNTER — Other Ambulatory Visit: Payer: Self-pay

## 2024-04-07 DIAGNOSIS — N3000 Acute cystitis without hematuria: Secondary | ICD-10-CM | POA: Diagnosis not present

## 2024-04-07 DIAGNOSIS — R109 Unspecified abdominal pain: Secondary | ICD-10-CM | POA: Diagnosis not present

## 2024-04-07 DIAGNOSIS — R1030 Lower abdominal pain, unspecified: Secondary | ICD-10-CM | POA: Diagnosis not present

## 2024-04-13 DIAGNOSIS — Z961 Presence of intraocular lens: Secondary | ICD-10-CM | POA: Diagnosis not present

## 2024-04-13 DIAGNOSIS — H401131 Primary open-angle glaucoma, bilateral, mild stage: Secondary | ICD-10-CM | POA: Diagnosis not present

## 2024-04-16 ENCOUNTER — Ambulatory Visit
Admission: RE | Admit: 2024-04-16 | Discharge: 2024-04-16 | Disposition: A | Discharge status: Home / Self Care | Source: Ambulatory Visit | Attending: Internal Medicine | Admitting: Internal Medicine

## 2024-04-16 DIAGNOSIS — Z1231 Encounter for screening mammogram for malignant neoplasm of breast: Secondary | ICD-10-CM

## 2024-04-20 ENCOUNTER — Other Ambulatory Visit: Payer: Self-pay

## 2024-04-21 ENCOUNTER — Ambulatory Visit: Payer: Self-pay | Admitting: Internal Medicine

## 2024-04-22 ENCOUNTER — Ambulatory Visit (HOSPITAL_BASED_OUTPATIENT_CLINIC_OR_DEPARTMENT_OTHER)
Admission: RE | Admit: 2024-04-22 | Discharge: 2024-04-22 | Disposition: A | Discharge status: Home / Self Care | Source: Ambulatory Visit | Attending: Internal Medicine | Admitting: Internal Medicine

## 2024-04-22 DIAGNOSIS — M85851 Other specified disorders of bone density and structure, right thigh: Secondary | ICD-10-CM | POA: Diagnosis not present

## 2024-04-22 DIAGNOSIS — Z8781 Personal history of (healed) traumatic fracture: Secondary | ICD-10-CM | POA: Insufficient documentation

## 2024-04-22 DIAGNOSIS — Z780 Asymptomatic menopausal state: Secondary | ICD-10-CM | POA: Diagnosis not present

## 2024-04-23 ENCOUNTER — Ambulatory Visit: Payer: Self-pay | Admitting: Family Medicine

## 2024-05-02 ENCOUNTER — Other Ambulatory Visit: Payer: Self-pay | Admitting: Internal Medicine

## 2024-05-02 DIAGNOSIS — E669 Obesity, unspecified: Secondary | ICD-10-CM

## 2024-05-20 NOTE — Progress Notes (Signed)
 Rhonda Miles                                          MRN: [id number]   05/20/2024   The [facility] Quality Team Specialist reviewed this patient medical record for the purposes of chart review for care gap closure. The following were reviewed: chart review for care gap closure-kidney health evaluation for diabetes:eGFR  and uACR.    [facility] Quality Team

## 2024-05-27 ENCOUNTER — Other Ambulatory Visit: Payer: Self-pay

## 2024-06-03 ENCOUNTER — Telehealth: Payer: Self-pay | Admitting: Internal Medicine

## 2024-06-03 NOTE — Telephone Encounter (Signed)
 Confirmed appt for 11/13

## 2024-06-04 ENCOUNTER — Other Ambulatory Visit: Payer: Self-pay

## 2024-06-04 ENCOUNTER — Encounter: Payer: Self-pay | Admitting: Internal Medicine

## 2024-06-04 ENCOUNTER — Ambulatory Visit: Attending: Internal Medicine | Admitting: Internal Medicine

## 2024-06-04 ENCOUNTER — Other Ambulatory Visit: Payer: Self-pay | Admitting: Internal Medicine

## 2024-06-04 VITALS — BP 110/70 | HR 93 | Temp 97.6°F | Ht 67.0 in | Wt 188.0 lb

## 2024-06-04 DIAGNOSIS — Z7985 Long-term (current) use of injectable non-insulin antidiabetic drugs: Secondary | ICD-10-CM | POA: Diagnosis not present

## 2024-06-04 DIAGNOSIS — E119 Type 2 diabetes mellitus without complications: Secondary | ICD-10-CM

## 2024-06-04 DIAGNOSIS — E1169 Type 2 diabetes mellitus with other specified complication: Secondary | ICD-10-CM

## 2024-06-04 DIAGNOSIS — E1159 Type 2 diabetes mellitus with other circulatory complications: Secondary | ICD-10-CM

## 2024-06-04 DIAGNOSIS — Z23 Encounter for immunization: Secondary | ICD-10-CM

## 2024-06-04 DIAGNOSIS — I10 Essential (primary) hypertension: Secondary | ICD-10-CM

## 2024-06-04 DIAGNOSIS — Z7984 Long term (current) use of oral hypoglycemic drugs: Secondary | ICD-10-CM

## 2024-06-04 DIAGNOSIS — E785 Hyperlipidemia, unspecified: Secondary | ICD-10-CM

## 2024-06-04 LAB — POCT GLYCOSYLATED HEMOGLOBIN (HGB A1C): HbA1c, POC (controlled diabetic range): 6.9 % (ref 0.0–7.0)

## 2024-06-04 LAB — GLUCOSE, POCT (MANUAL RESULT ENTRY): POC Glucose: 101 mg/dL — AB (ref 70–99)

## 2024-06-04 MED ORDER — AMLODIPINE BESYLATE 10 MG PO TABS: 10.0000 mg | ORAL_TABLET | Freq: Every day | ORAL | 1 refills | Status: AC

## 2024-06-04 MED ORDER — SEMAGLUTIDE (2 MG/DOSE) 8 MG/3ML ~~LOC~~ SOPN: 2.0000 mg | PEN_INJECTOR | SUBCUTANEOUS | 1 refills | Status: DC

## 2024-06-04 NOTE — Patient Instructions (Signed)
  VISIT SUMMARY: Today, we reviewed your chronic conditions, including diabetes, hypertension, and hyperlipidemia. Your diabetes management shows an increase in A1c to 6.9%, but it remains below the target of 7.0%. You have lost 16 pounds over the past four months, and your fasting blood sugars are slightly higher than before. Your hypertension and hyperlipidemia are well-controlled with your current medications. We discussed the importance of regular exercise and protein intake in your diet.  YOUR PLAN: -TYPE 2 DIABETES MELLITUS: Type 2 diabetes is a condition where your body does not use insulin  properly, leading to high blood sugar levels. Your A1c has increased to 6.9%, but it is still below the target of 7.0%. You should continue taking Ozempic  2 mg weekly and metformin  1000 mg twice daily. We encourage you to engage in weight-bearing exercises several days a week and to include protein with each meal. We have also ordered a urine test to check for proteinuria, which can be a sign of kidney issues related to diabetes.  -OVERWEIGHT: Being overweight means having more body weight than is considered healthy. You have lost weight, decreasing from 223 to 188 pounds, which is a positive change. Continue with your current weight management plan, including taking Ozempic  and making lifestyle modifications.  -HYPERTENSION: Hypertension, or high blood pressure, is a condition where the force of the blood against your artery walls is too high. Your blood pressure is well-controlled at 110/70 mmHg with your current medications. Continue taking lisinopril  20 mg daily and amlodipine  10 mg daily, and keep up with your salt restriction.  -HYPERLIPIDEMIA: Hyperlipidemia is having high levels of fats (lipids) in your blood, which can increase your risk of heart disease. Your condition is well-managed with atorvastatin , and your LDL cholesterol is at a healthy level of 31 mg/dL. Continue taking atorvastatin  40 mg  daily.  INSTRUCTIONS: Please follow up with a urine test for proteinuria as ordered. Continue with your current medications and lifestyle changes. Engage in regular weight-bearing exercises and ensure you include protein with each meal. If you experience any new symptoms or have concerns, please schedule an appointment.                      Contains text generated by [employer].                                 Contains text generated by [employer].

## 2024-06-04 NOTE — Progress Notes (Signed)
 Patient ID: Rhonda Miles, female    DOB: [birth year]  MRN: [id number]  CC: Diabetes (DM f/u./No questions / concerns/Flu vax administered on 06/04/24 - C.A.)   Subjective: Rhonda Miles is a 73 y.o. female who presents for chronic ds management. Her concerns today include:  Patient with history of DM type II, HL, IDA, obesity, HTN.   Discussed the use of AI scribe software for clinical note transcription with the patient, who gave verbal consent to proceed.  History of Present Illness Rhonda Miles is a 73 year old female with diabetes, hypertension, and hyperlipidemia who presents for follow-up of her chronic medical conditions.  DM: Results for orders placed or performed in visit on 06/04/24  POCT glucose (manual entry)   Collection Time: 06/04/24  3:41 PM  Result Value Ref Range   POC Glucose 101 (A) 70 - 99 mg/dl  POCT glycosylated hemoglobin (Hb A1C)   Collection Time: 06/04/24  3:42 PM  Result Value Ref Range   Hemoglobin A1C     HbA1c POC (<> result, manual entry)     HbA1c, POC (prediabetic range)     HbA1c, POC (controlled diabetic range) 6.9 0.0 - 7.0 %  Her diabetes management is under review as her A1c has increased from 6.0% four months ago to 6.9% today, though it remains below 7.0%. She is currently on Ozempic  2 mg weekly and metformin  1000 mg twice daily. She experiences occasional mild gastrointestinal symptoms, such as increased bowel movements on the day of the injection or the following day. She has lost 16 pounds over the past four months, now weighing 188 pounds, down from 204 pounds at the last visit 4 mths ago and 223 pounds in April. Her fasting blood sugars are typically between 100 and 130 mg/dL, which is higher than her previous readings of 80s and 90s. She lacks regular exercise, attributing it to feeling 'lazy'.  HTN: She continues to take lisinopril  20 mg daily and amlodipine  10 mg daily for hypertension. She is mindful of her salt  intake, adding only a little during cooking and avoiding additional salt. No chest pain or shortness of breath.  For hyperlipidemia, she continues atorvastatin  40 mg nightly. Her last LDL cholesterol was 31 mg/dL in July, which is within the target range.      Patient Active Problem List   Diagnosis Date Noted   Lumbar radicular pain 02/06/2022   Vitamin B 12 deficiency 12/20/2021   Iron  deficiency anemia 08/28/2019   Influenza vaccine needed 04/03/2019   Hyperlipidemia associated with type 2 diabetes mellitus (HCC) 04/03/2019   Essential hypertension 06/07/2017   Chronic posterior anal fissure 03/26/2017   Prolapsed internal hemorrhoids, grade 2 03/26/2017   Glaucoma (increased eye pressure) 07/12/2015   Snoring 07/12/2015   Bothersome menopausal vasomotor symptoms 06/03/2014   Diabetes mellitus due to underlying condition without complications (HCC) 01/26/2014   Obesity 02/13/2013     Current Outpatient Medications on File Prior to Visit  Medication Sig Dispense Refill   Accu-Chek Softclix Lancets lancets Use to check blood sugar 3 times daily. 100 each 6   atorvastatin  (LIPITOR) 40 MG tablet Take 1 tablet (40 mg total) by mouth daily. 90 tablet 1   Blood Glucose Monitoring Suppl (ACCU-CHEK GUIDE) w/Device KIT Use to check blood sugar 3 times daily. E11.69 1 kit 0   Blood Pressure Monitor DEVI Use as directed to check home blood pressure 2-3 times a week 1 Device 0   cholecalciferol (VITAMIN D3) 10  MCG/ML LIQD oral liquid Take 400 Units by mouth daily.     cyanocobalamin  (VITAMIN B12) 1000 MCG tablet Take 1,000 mcg by mouth daily.     Ferrous Sulfate  (IRON ) 325 (65 Fe) MG TABS Take 1 tablet by mouth Mon-Fri. 100 tablet 1   gabapentin  (NEURONTIN ) 300 MG capsule TAKE 1 CAPSULE BY MOUTH EVERYDAY AT BEDTIME 90 capsule 2   glucose blood (ACCU-CHEK GUIDE TEST) test strip USE TO CHECK BLOOD SUGAR 3 TIMES DAILY 100 strip 1   latanoprost (XALATAN) 0.005 % ophthalmic solution 1 drop at  bedtime.     lidocaine  (LIDODERM ) 5 % Place 1 patch onto the skin daily. Remove & Discard patch within 12 hours or as directed by MD 15 patch 0   lisinopril  (ZESTRIL ) 20 MG tablet Take 1 tablet (20 mg total) by mouth daily. 90 tablet 1   metFORMIN  (GLUCOPHAGE ) 500 MG tablet TAKE 2 TABLETS BY MOUTH DAILY AFTER BREAKFAST, AND 2 TABLETS AFTER DINNER 360 tablet 1   OVER THE COUNTER MEDICATION daily at 12 noon. 750 mg     glimepiride  (AMARYL ) 4 MG tablet Take 4 mg by mouth every morning.     Current Facility-Administered Medications on File Prior to Visit  Medication Dose Route Frequency Provider Last Rate Last Admin   glucose chewable tablet 16 g  4 tablet Oral Once         Allergies  Allergen Reactions   Asa [Aspirin] Other (See Comments)    shakey    Social History   Socioeconomic History   Marital status: Single    Spouse name: Not on file   Number of children: Not on file   Years of education: Not on file   Highest education level: 12th grade  Occupational History   Occupation: Conservator, Museum/gallery: [employer]  Tobacco Use   Smoking status: Former    Current packs/day: 0.00    Types: Cigarettes    Quit date: 10/22/1983    Years since quitting: 40.6   Smokeless tobacco: Never  Vaping Use   Vaping status: Never Used  Substance and Sexual Activity   Alcohol use: No   Drug use: No   Sexual activity: Yes    Partners: Male    Birth control/protection: Surgical  Other Topics Concern   Not on file  Social History Narrative   Personal care assistant   Married   Social Drivers of Health   Financial Resource Strain: Low Risk  (06/02/2024)   Overall Financial Resource Strain (CARDIA)    Difficulty of Paying Living Expenses: Not hard at all  Food Insecurity: No Food Insecurity (06/02/2024)   Hunger Vital Sign    Worried About Running Out of Food in the Last Year: Never true    Ran Out of Food in the Last Year: Never true  Transportation Needs: No  Transportation Needs (06/02/2024)   PRAPARE - Administrator, Civil Service (Medical): No    Lack of Transportation (Non-Medical): No  Physical Activity: Insufficiently Active (06/02/2024)   Exercise Vital Sign    Days of Exercise per Week: 1 day    Minutes of Exercise per Session: 20 min  Stress: No Stress Concern Present (06/02/2024)   [employer] of Occupational Health - Occupational Stress Questionnaire    Feeling of Stress: Not at all  Social Connections: Moderately Integrated (06/02/2024)   Social Connection and Isolation Panel    Frequency of Communication with Friends and Family: Three times  a week    Frequency of Social Gatherings with Friends and Family: Three times a week    Attends Religious Services: More than 4 times per year    Active Member of Clubs or Organizations: Yes    Attends Banker Meetings: More than 4 times per year    Marital Status: Divorced  Intimate Partner Violence: Not At Risk (11/12/2023)   Humiliation, Afraid, Rape, and Kick questionnaire    Fear of Current or Ex-Partner: No    Emotionally Abused: No    Physically Abused: No    Sexually Abused: No    Family History  Problem Relation Age of Onset   Hypertension Mother    Diabetes Father    Cancer - Other Father        FROM CHEMICAL EXPOSURE   Breast cancer Maternal Aunt    Colon cancer Maternal Grandmother 66   Stomach cancer Neg Hx    Pancreatic cancer Neg Hx    Colon polyps Neg Hx    Esophageal cancer Neg Hx    Rectal cancer Neg Hx     Past Surgical History:  Procedure Laterality Date   BILATERAL SALPINGOOPHORECTOMY  11/05/2002   cataract surgery Bilateral    COLONOSCOPY  09/2013   LACERATION REPAIR Right 1957   ankle from lawnmower accident   LAPAROSCOPIC ASSISTED VAGINAL HYSTERECTOMY  11/05/2002   Dr. Johnnye, done for menorrhagia.  Benign pathology    ROS: Review of Systems Negative except as stated above  PHYSICAL EXAM: BP 110/70 (BP  Location: Left Arm, Patient Position: Sitting, Cuff Size: Normal)   Pulse 93   Temp 97.6 F (36.4 C) (Oral)   Ht 5' 7 (1.702 m)   Wt 188 lb (85.3 kg)   SpO2 99%   BMI 29.44 kg/m   Wt Readings from Last 3 Encounters:  06/04/24 188 lb (85.3 kg)  01/30/24 204 lb (92.5 kg)  11/14/23 223 lb (101.2 kg)    Physical Exam  General appearance - alert, well appearing, elderly AAF and in no distress Mental status - normal mood, behavior, speech, dress, motor activity, and thought processes Neck - supple, no significant adenopathy Chest - clear to auscultation, no wheezes, rales or rhonchi, symmetric air entry Heart - normal rate, regular rhythm, normal S1, S2, no murmurs, rubs, clicks or gallops Extremities - peripheral pulses normal, no pedal edema, no clubbing or cyanosis      Latest Ref Rng & Units 06/06/2023   11:01 AM 04/20/2022    9:21 AM 08/18/2021    2:42 PM  CMP  Glucose 70 - 99 mg/dL 799  865  870   BUN 8 - 27 mg/dL 14  13  10    Creatinine 0.57 - 1.00 mg/dL 9.21  9.35  9.19   Sodium 134 - 144 mmol/L 143  142  139   Potassium 3.5 - 5.2 mmol/L 4.0  4.3  4.6   Chloride 96 - 106 mmol/L 103  103  102   CO2 20 - 29 mmol/L 19  23  23    Calcium  8.7 - 10.3 mg/dL 9.5  9.7  9.8   Total Protein 6.0 - 8.5 g/dL 7.0   7.3   Total Bilirubin 0.0 - 1.2 mg/dL 0.7   0.7   Alkaline Phos 44 - 121 IU/L 84   87   AST 0 - 40 IU/L 17   19   ALT 0 - 32 IU/L 15   21    Lipid Panel  Component Value Date/Time   CHOL 79 (L) 01/30/2024 1654   TRIG 71 01/30/2024 1654   HDL 32 (L) 01/30/2024 1654   CHOLHDL 2.5 01/30/2024 1654   CHOLHDL 2.9 10/31/2015 1245   VLDL 15 10/31/2015 1245   LDLCALC 31 01/30/2024 1654    CBC    Component Value Date/Time   WBC 6.2 01/30/2024 1654   WBC 5.3 10/31/2015 1245   RBC 4.69 01/30/2024 1654   RBC 4.85 10/31/2015 1245   HGB 12.5 01/30/2024 1654   HCT 40.2 01/30/2024 1654   PLT 318 01/30/2024 1654   MCV 86 01/30/2024 1654   MCH 26.7 01/30/2024 1654    MCH 24.1 (L) 10/31/2015 1245   MCHC 31.1 (L) 01/30/2024 1654   MCHC 31.3 (L) 10/31/2015 1245   RDW 13.9 01/30/2024 1654   LYMPHSABS 2.4 10/26/2013 1154   MONOABS 0.3 10/26/2013 1154   EOSABS 0.1 10/26/2013 1154   BASOSABS 0.0 10/26/2013 1154    ASSESSMENT AND PLAN: 1. Type 2 diabetes mellitus with other specified complication, without long-term current use of insulin  (HCC) (Primary) At goal and wgh is responding well to Ozempic  - Continue Ozempic  2 mg weekly. - Continue metformin  1000 mg twice daily. - Encouraged weight-bearing exercise several days a week and advised protein intake with each meal to help prevent loss of muscle mass. - Microalbumin / creatinine urine ratio - POCT glycosylated hemoglobin (Hb A1C) - POCT glucose (manual entry) - Semaglutide , 2 MG/DOSE, 8 MG/3ML SOPN; Inject 2 mg as directed once a week.  Dispense: 9 mL; Refill: 1  2. Long-term (current) use of injectable non-insulin  antidiabetic drugs 3. Diabetes mellitus treated with oral medication (HCC) As above  4. Hypertension associated with diabetes (HCC) At goal - Continue lisinopril  20 mg daily. - Continue amlodipine  10 mg daily. - Encouraged continued salt restriction. - amLODipine  (NORVASC ) 10 MG tablet; Take 1 tablet (10 mg total) by mouth daily.  Dispense: 90 tablet; Refill: 1  5. Hyperlipidemia associated with type 2 diabetes mellitus (HCC) Well-managed with atorvastatin . LDL 31 mg/dL, below target. - Continue atorvastatin  40 mg daily.  6. Influenza vaccine needed given   Patient was given the opportunity to ask questions.  Patient verbalized understanding of the plan and was able to repeat key elements of the plan.   This documentation was completed using Paediatric nurse.  Any transcriptional errors are unintentional.  Orders Placed This Encounter  Procedures   Flu vaccine HIGH DOSE PF(Fluzone Trivalent)   Microalbumin / creatinine urine ratio   POCT glycosylated  hemoglobin (Hb A1C)   POCT glucose (manual entry)     Requested Prescriptions   Signed Prescriptions Disp Refills   amLODipine  (NORVASC ) 10 MG tablet 90 tablet 1    Sig: Take 1 tablet (10 mg total) by mouth daily.   Semaglutide , 2 MG/DOSE, 8 MG/3ML SOPN 9 mL 1    Sig: Inject 2 mg as directed once a week.    Return in about 4 months (around 10/02/2024).  Barnie Louder, MD, FACP

## 2024-06-05 ENCOUNTER — Other Ambulatory Visit: Payer: Self-pay

## 2024-06-05 ENCOUNTER — Other Ambulatory Visit: Payer: Self-pay | Admitting: Pharmacist

## 2024-06-05 DIAGNOSIS — E1169 Type 2 diabetes mellitus with other specified complication: Secondary | ICD-10-CM

## 2024-06-05 MED ORDER — SEMAGLUTIDE (2 MG/DOSE) 8 MG/3ML ~~LOC~~ SOPN
2.0000 mg | PEN_INJECTOR | SUBCUTANEOUS | 1 refills | Status: AC
  Filled 2024-06-05: qty 9, 84d supply, fill #0

## 2024-06-08 ENCOUNTER — Other Ambulatory Visit: Payer: Self-pay

## 2024-06-12 ENCOUNTER — Other Ambulatory Visit

## 2024-06-27 ENCOUNTER — Other Ambulatory Visit: Payer: Self-pay | Admitting: Internal Medicine

## 2024-06-27 DIAGNOSIS — E669 Obesity, unspecified: Secondary | ICD-10-CM

## 2024-06-27 NOTE — Telephone Encounter (Signed)
 Requested medication (s) are due for refill today: Yes  Requested medication (s) are on the active medication list: Yes  Last refill:  05/04/24  Future visit scheduled: Yes  Notes to clinic:  Unable to refill due to no refill protocol for this medication.      Requested Prescriptions  Pending Prescriptions Disp Refills   ACCU-CHEK GUIDE TEST test strip [Pharmacy Med Name: ACCU-CHEK GUIDE TEST STRIP] 100 strip 1    Sig: USE TO CHECK BLOOD SUGAR 3 TIMES DAILY     There is no refill protocol information for this order

## 2024-07-22 ENCOUNTER — Other Ambulatory Visit: Payer: Self-pay | Admitting: Internal Medicine

## 2024-07-22 DIAGNOSIS — E1169 Type 2 diabetes mellitus with other specified complication: Secondary | ICD-10-CM

## 2024-07-22 DIAGNOSIS — E669 Obesity, unspecified: Secondary | ICD-10-CM

## 2024-10-05 ENCOUNTER — Ambulatory Visit: Admitting: Internal Medicine

## 2024-11-17 ENCOUNTER — Ambulatory Visit
